# Patient Record
Sex: Female | Born: 1945 | Race: White | Hispanic: No | Marital: Single | Smoking: Former smoker
Health system: Southern US, Community
[De-identification: ages and names within clinical notes are randomized; demographics above are authoritative.]

## PROBLEM LIST (undated history)

## (undated) DIAGNOSIS — I10 Essential (primary) hypertension: Secondary | ICD-10-CM

## (undated) DIAGNOSIS — R51 Headache: Secondary | ICD-10-CM

## (undated) DIAGNOSIS — R110 Nausea: Secondary | ICD-10-CM

## (undated) DIAGNOSIS — R5383 Other fatigue: Principal | ICD-10-CM

## (undated) DIAGNOSIS — J310 Chronic rhinitis: Secondary | ICD-10-CM

## (undated) DIAGNOSIS — C140 Malignant neoplasm of pharynx, unspecified: Secondary | ICD-10-CM

## (undated) DIAGNOSIS — E785 Hyperlipidemia, unspecified: Secondary | ICD-10-CM

## (undated) DIAGNOSIS — E039 Hypothyroidism, unspecified: Principal | ICD-10-CM

## (undated) DIAGNOSIS — T884XXA Failed or difficult intubation, initial encounter: Secondary | ICD-10-CM

## (undated) DIAGNOSIS — C50919 Malignant neoplasm of unspecified site of unspecified female breast: Secondary | ICD-10-CM

## (undated) HISTORY — DX: Malignant neoplasm of pharynx, unspecified: C14.0

## (undated) HISTORY — PX: OTHER SURGICAL HISTORY: SHX169

## (undated) HISTORY — DX: Hyperlipidemia, unspecified: E78.5

## (undated) HISTORY — DX: Hypothyroidism, unspecified: E03.9

## (undated) HISTORY — DX: Chronic rhinitis: J31.0

## (undated) HISTORY — PX: ABDOMINAL SURGERY: SHX537

## (undated) HISTORY — DX: Other fatigue: R53.83

## (undated) HISTORY — DX: Nausea: R11.0

## (undated) HISTORY — DX: Essential (primary) hypertension: I10

## (undated) HISTORY — DX: Headache: R51

## (undated) HISTORY — PX: APPENDECTOMY: SHX54

---

## 1998-11-03 ENCOUNTER — Other Ambulatory Visit: Admission: RE | Admit: 1998-11-03 | Discharge: 1998-11-03 | Payer: Self-pay | Admitting: Obstetrics and Gynecology

## 1999-09-20 ENCOUNTER — Encounter: Payer: Self-pay | Admitting: Obstetrics and Gynecology

## 1999-09-20 ENCOUNTER — Encounter: Admission: RE | Admit: 1999-09-20 | Discharge: 1999-09-20 | Payer: Self-pay | Admitting: Obstetrics and Gynecology

## 1999-11-06 ENCOUNTER — Other Ambulatory Visit: Admission: RE | Admit: 1999-11-06 | Discharge: 1999-11-06 | Payer: Self-pay | Admitting: Obstetrics and Gynecology

## 2000-10-02 ENCOUNTER — Encounter: Payer: Self-pay | Admitting: Obstetrics and Gynecology

## 2000-10-02 ENCOUNTER — Encounter: Admission: RE | Admit: 2000-10-02 | Discharge: 2000-10-02 | Payer: Self-pay | Admitting: Obstetrics and Gynecology

## 2000-11-06 ENCOUNTER — Other Ambulatory Visit: Admission: RE | Admit: 2000-11-06 | Discharge: 2000-11-06 | Payer: Self-pay | Admitting: Obstetrics and Gynecology

## 2001-10-15 ENCOUNTER — Encounter: Payer: Self-pay | Admitting: Obstetrics and Gynecology

## 2001-10-15 ENCOUNTER — Encounter: Admission: RE | Admit: 2001-10-15 | Discharge: 2001-10-15 | Payer: Self-pay | Admitting: Obstetrics and Gynecology

## 2001-11-12 ENCOUNTER — Other Ambulatory Visit: Admission: RE | Admit: 2001-11-12 | Discharge: 2001-11-12 | Payer: Self-pay | Admitting: Obstetrics and Gynecology

## 2002-11-05 ENCOUNTER — Encounter: Admission: RE | Admit: 2002-11-05 | Discharge: 2002-11-05 | Payer: Self-pay | Admitting: Obstetrics and Gynecology

## 2002-11-05 ENCOUNTER — Encounter: Payer: Self-pay | Admitting: Obstetrics and Gynecology

## 2002-11-19 ENCOUNTER — Other Ambulatory Visit: Admission: RE | Admit: 2002-11-19 | Discharge: 2002-11-19 | Payer: Self-pay | Admitting: Obstetrics and Gynecology

## 2003-11-17 ENCOUNTER — Encounter: Admission: RE | Admit: 2003-11-17 | Discharge: 2003-11-17 | Payer: Self-pay | Admitting: Obstetrics and Gynecology

## 2003-11-22 ENCOUNTER — Other Ambulatory Visit: Admission: RE | Admit: 2003-11-22 | Discharge: 2003-11-22 | Payer: Self-pay | Admitting: Obstetrics and Gynecology

## 2004-04-27 ENCOUNTER — Ambulatory Visit: Payer: Self-pay | Admitting: Internal Medicine

## 2004-05-08 ENCOUNTER — Ambulatory Visit: Payer: Self-pay | Admitting: Internal Medicine

## 2004-11-07 ENCOUNTER — Ambulatory Visit: Payer: Self-pay | Admitting: Internal Medicine

## 2004-11-20 ENCOUNTER — Ambulatory Visit: Payer: Self-pay | Admitting: Internal Medicine

## 2004-11-21 ENCOUNTER — Encounter: Admission: RE | Admit: 2004-11-21 | Discharge: 2004-11-21 | Payer: Self-pay | Admitting: Internal Medicine

## 2004-11-22 ENCOUNTER — Encounter: Admission: RE | Admit: 2004-11-22 | Discharge: 2004-11-22 | Payer: Self-pay | Admitting: Obstetrics and Gynecology

## 2004-12-07 ENCOUNTER — Encounter: Admission: RE | Admit: 2004-12-07 | Discharge: 2004-12-07 | Payer: Self-pay | Admitting: Obstetrics and Gynecology

## 2004-12-21 ENCOUNTER — Other Ambulatory Visit: Admission: RE | Admit: 2004-12-21 | Discharge: 2004-12-21 | Payer: Self-pay | Admitting: Obstetrics and Gynecology

## 2005-08-23 ENCOUNTER — Ambulatory Visit: Payer: Self-pay | Admitting: Internal Medicine

## 2005-09-04 ENCOUNTER — Ambulatory Visit: Payer: Self-pay | Admitting: Internal Medicine

## 2005-12-11 ENCOUNTER — Encounter: Admission: RE | Admit: 2005-12-11 | Discharge: 2005-12-11 | Payer: Self-pay | Admitting: Obstetrics and Gynecology

## 2005-12-25 ENCOUNTER — Other Ambulatory Visit: Admission: RE | Admit: 2005-12-25 | Discharge: 2005-12-25 | Payer: Self-pay | Admitting: Obstetrics & Gynecology

## 2006-02-26 ENCOUNTER — Ambulatory Visit: Payer: Self-pay | Admitting: Internal Medicine

## 2006-03-14 ENCOUNTER — Ambulatory Visit: Payer: Self-pay | Admitting: Internal Medicine

## 2006-07-02 DIAGNOSIS — C50919 Malignant neoplasm of unspecified site of unspecified female breast: Secondary | ICD-10-CM

## 2006-07-02 HISTORY — PX: BREAST LUMPECTOMY: SHX2

## 2006-07-02 HISTORY — DX: Malignant neoplasm of unspecified site of unspecified female breast: C50.919

## 2006-09-24 ENCOUNTER — Ambulatory Visit: Payer: Self-pay | Admitting: Internal Medicine

## 2006-09-24 LAB — CONVERTED CEMR LAB
Cholesterol: 304 mg/dL (ref 0–200)
Direct LDL: 172.9 mg/dL
HDL: 93.1 mg/dL (ref >39.0)
TSH: 0.93 microintl units/mL (ref 0.35–5.50)
Total CHOL/HDL Ratio: 3.3

## 2007-02-13 ENCOUNTER — Encounter: Admission: RE | Admit: 2007-02-13 | Discharge: 2007-02-13 | Payer: Self-pay | Admitting: Obstetrics and Gynecology

## 2007-02-14 ENCOUNTER — Encounter: Payer: Self-pay | Admitting: Internal Medicine

## 2007-02-21 DIAGNOSIS — R519 Headache, unspecified: Secondary | ICD-10-CM | POA: Insufficient documentation

## 2007-02-21 DIAGNOSIS — R51 Headache: Secondary | ICD-10-CM

## 2007-02-21 DIAGNOSIS — E785 Hyperlipidemia, unspecified: Secondary | ICD-10-CM

## 2007-02-21 DIAGNOSIS — I10 Essential (primary) hypertension: Secondary | ICD-10-CM | POA: Insufficient documentation

## 2007-02-27 ENCOUNTER — Encounter: Admission: RE | Admit: 2007-02-27 | Discharge: 2007-02-27 | Payer: Self-pay | Admitting: Obstetrics and Gynecology

## 2007-02-27 ENCOUNTER — Encounter (INDEPENDENT_AMBULATORY_CARE_PROVIDER_SITE_OTHER): Payer: Self-pay | Admitting: Diagnostic Radiology

## 2007-02-27 ENCOUNTER — Other Ambulatory Visit: Admission: RE | Admit: 2007-02-27 | Discharge: 2007-02-27 | Payer: Self-pay | Admitting: Obstetrics and Gynecology

## 2007-03-04 ENCOUNTER — Telehealth: Payer: Self-pay | Admitting: Internal Medicine

## 2007-03-06 ENCOUNTER — Ambulatory Visit: Payer: Self-pay | Admitting: Internal Medicine

## 2007-03-06 DIAGNOSIS — F17200 Nicotine dependence, unspecified, uncomplicated: Secondary | ICD-10-CM | POA: Insufficient documentation

## 2007-03-06 DIAGNOSIS — F411 Generalized anxiety disorder: Secondary | ICD-10-CM | POA: Insufficient documentation

## 2007-03-10 ENCOUNTER — Encounter: Admission: RE | Admit: 2007-03-10 | Discharge: 2007-03-10 | Payer: Self-pay | Admitting: Obstetrics and Gynecology

## 2007-03-11 ENCOUNTER — Encounter: Payer: Self-pay | Admitting: Internal Medicine

## 2007-03-11 LAB — CONVERTED CEMR LAB
BUN: 8 mg/dL (ref 6–23)
CO2: 27 meq/L (ref 19–32)
Calcium: 10.6 mg/dL — ABNORMAL HIGH (ref 8.4–10.5)
Chloride: 94 meq/L — ABNORMAL LOW (ref 96–112)
Creatinine, Ser: 0.3 mg/dL — ABNORMAL LOW (ref 0.4–1.2)
GFR calc Af Amer: 291 mL/min
GFR calc non Af Amer: 240 mL/min
Glucose, Bld: 97 mg/dL (ref 70–99)
Potassium: 4.9 meq/L (ref 3.5–5.1)
Sodium: 130 meq/L — ABNORMAL LOW (ref 135–145)

## 2007-03-19 ENCOUNTER — Telehealth (INDEPENDENT_AMBULATORY_CARE_PROVIDER_SITE_OTHER): Payer: Self-pay | Admitting: *Deleted

## 2007-03-28 ENCOUNTER — Ambulatory Visit (HOSPITAL_COMMUNITY): Admission: RE | Admit: 2007-03-28 | Discharge: 2007-03-29 | Payer: Self-pay | Admitting: Surgery

## 2007-03-28 ENCOUNTER — Encounter (INDEPENDENT_AMBULATORY_CARE_PROVIDER_SITE_OTHER): Payer: Self-pay | Admitting: Surgery

## 2007-04-01 ENCOUNTER — Ambulatory Visit: Payer: Self-pay | Admitting: Oncology

## 2007-04-03 ENCOUNTER — Encounter: Payer: Self-pay | Admitting: Internal Medicine

## 2007-04-07 ENCOUNTER — Ambulatory Visit: Admission: RE | Admit: 2007-04-07 | Discharge: 2007-05-22 | Payer: Self-pay | Admitting: Radiation Oncology

## 2007-04-08 ENCOUNTER — Encounter: Payer: Self-pay | Admitting: Internal Medicine

## 2007-04-08 LAB — CBC WITH DIFFERENTIAL/PLATELET
Basophils Absolute: 0.1 10*3/uL (ref 0.0–0.1)
EOS%: 1.6 % (ref 0.0–7.0)
HGB: 13.4 g/dL (ref 11.6–15.9)
MCH: 32.9 pg (ref 26.0–34.0)
MCV: 92.7 fL (ref 81.0–101.0)
MONO%: 10.8 % (ref 0.0–13.0)
NEUT#: 5.7 10*3/uL (ref 1.5–6.5)
RBC: 4.09 10*6/uL (ref 3.70–5.32)
RDW: 12 % (ref 11.3–14.5)
lymph#: 1.9 10*3/uL (ref 0.9–3.3)

## 2007-04-08 LAB — COMPREHENSIVE METABOLIC PANEL
ALT: 18 U/L (ref 0–35)
CO2: 23 mEq/L (ref 19–32)
Calcium: 10.5 mg/dL (ref 8.4–10.5)
Chloride: 96 mEq/L (ref 96–112)
Creatinine, Ser: 0.47 mg/dL (ref 0.40–1.20)
Total Protein: 7.8 g/dL (ref 6.0–8.3)

## 2007-04-08 LAB — LACTATE DEHYDROGENASE: LDH: 116 U/L (ref 94–250)

## 2007-04-14 ENCOUNTER — Telehealth: Payer: Self-pay | Admitting: Internal Medicine

## 2007-04-14 LAB — VITAMIN D PNL(25-HYDRXY+1,25-DIHY)-BLD: Vit D, 25-Hydroxy: 53 ng/mL (ref 30–89)

## 2007-04-15 ENCOUNTER — Ambulatory Visit (HOSPITAL_COMMUNITY): Admission: RE | Admit: 2007-04-15 | Discharge: 2007-04-15 | Payer: Self-pay | Admitting: Oncology

## 2007-04-17 ENCOUNTER — Ambulatory Visit (HOSPITAL_COMMUNITY): Admission: RE | Admit: 2007-04-17 | Discharge: 2007-04-17 | Payer: Self-pay | Admitting: Oncology

## 2007-04-17 ENCOUNTER — Encounter: Payer: Self-pay | Admitting: Internal Medicine

## 2007-04-24 ENCOUNTER — Encounter: Payer: Self-pay | Admitting: Oncology

## 2007-04-25 ENCOUNTER — Ambulatory Visit: Payer: Self-pay

## 2007-04-30 ENCOUNTER — Ambulatory Visit (HOSPITAL_BASED_OUTPATIENT_CLINIC_OR_DEPARTMENT_OTHER): Admission: RE | Admit: 2007-04-30 | Discharge: 2007-04-30 | Payer: Self-pay | Admitting: Surgery

## 2007-05-14 ENCOUNTER — Ambulatory Visit: Payer: Self-pay | Admitting: Oncology

## 2007-05-14 ENCOUNTER — Encounter: Payer: Self-pay | Admitting: Internal Medicine

## 2007-05-14 LAB — CBC WITH DIFFERENTIAL/PLATELET
BASO%: 0.2 % (ref 0.0–2.0)
EOS%: 0.3 % (ref 0.0–7.0)
HCT: 33.5 % — ABNORMAL LOW (ref 34.8–46.6)
LYMPH%: 16.1 % (ref 14.0–48.0)
MCH: 33 pg (ref 26.0–34.0)
MCHC: 36 g/dL (ref 32.0–36.0)
MONO%: 13.1 % — ABNORMAL HIGH (ref 0.0–13.0)
NEUT%: 70.3 % (ref 39.6–76.8)
Platelets: 420 10*3/uL — ABNORMAL HIGH (ref 145–400)

## 2007-05-27 LAB — CBC WITH DIFFERENTIAL/PLATELET
Basophils Absolute: 0 10*3/uL (ref 0.0–0.1)
Eosinophils Absolute: 0 10*3/uL (ref 0.0–0.5)
HGB: 12.2 g/dL (ref 11.6–15.9)
NEUT#: 10.8 10*3/uL — ABNORMAL HIGH (ref 1.5–6.5)
RDW: 12.5 % (ref 11.3–14.5)
lymph#: 0.4 10*3/uL — ABNORMAL LOW (ref 0.9–3.3)

## 2007-06-04 ENCOUNTER — Encounter: Payer: Self-pay | Admitting: Internal Medicine

## 2007-06-04 LAB — CBC WITH DIFFERENTIAL/PLATELET
Basophils Absolute: 0.1 10*3/uL (ref 0.0–0.1)
Eosinophils Absolute: 0.1 10*3/uL (ref 0.0–0.5)
HGB: 11.6 g/dL (ref 11.6–15.9)
LYMPH%: 12.1 % — ABNORMAL LOW (ref 14.0–48.0)
MCV: 92.7 fL (ref 81.0–101.0)
MONO%: 14.2 % — ABNORMAL HIGH (ref 0.0–13.0)
NEUT#: 15.9 10*3/uL — ABNORMAL HIGH (ref 1.5–6.5)
Platelets: 428 10*3/uL — ABNORMAL HIGH (ref 145–400)
RDW: 12.5 % (ref 11.3–14.5)

## 2007-06-04 LAB — COMPREHENSIVE METABOLIC PANEL
Albumin: 4.3 g/dL (ref 3.5–5.2)
Alkaline Phosphatase: 112 U/L (ref 39–117)
BUN: 14 mg/dL (ref 6–23)
CO2: 25 mEq/L (ref 19–32)
Calcium: 9.8 mg/dL (ref 8.4–10.5)
Glucose, Bld: 99 mg/dL (ref 70–99)
Potassium: 4.3 mEq/L (ref 3.5–5.3)

## 2007-06-17 ENCOUNTER — Encounter: Payer: Self-pay | Admitting: Internal Medicine

## 2007-06-17 LAB — CBC WITH DIFFERENTIAL/PLATELET
Basophils Absolute: 0 10*3/uL (ref 0.0–0.1)
Eosinophils Absolute: 0 10*3/uL (ref 0.0–0.5)
HGB: 12.6 g/dL (ref 11.6–15.9)
MCV: 92.7 fL (ref 81.0–101.0)
MONO#: 0 10*3/uL — ABNORMAL LOW (ref 0.1–0.9)
NEUT#: 7.9 10*3/uL — ABNORMAL HIGH (ref 1.5–6.5)
RDW: 13.6 % (ref 11.3–14.5)
WBC: 8.3 10*3/uL (ref 3.9–10.0)
lymph#: 0.4 10*3/uL — ABNORMAL LOW (ref 0.9–3.3)

## 2007-06-17 LAB — COMPREHENSIVE METABOLIC PANEL
ALT: 31 U/L (ref 0–35)
AST: 24 U/L (ref 0–37)
Albumin: 4.8 g/dL (ref 3.5–5.2)
Alkaline Phosphatase: 121 U/L — ABNORMAL HIGH (ref 39–117)
BUN: 17 mg/dL (ref 6–23)
CO2: 24 mEq/L (ref 19–32)
Calcium: 10.4 mg/dL (ref 8.4–10.5)
Chloride: 96 mEq/L (ref 96–112)
Creatinine, Ser: 0.62 mg/dL (ref 0.40–1.20)
Glucose, Bld: 156 mg/dL — ABNORMAL HIGH (ref 70–99)
Potassium: 4.4 mEq/L (ref 3.5–5.3)
Sodium: 134 mEq/L — ABNORMAL LOW (ref 135–145)
Total Bilirubin: 0.3 mg/dL (ref 0.3–1.2)
Total Protein: 7.7 g/dL (ref 6.0–8.3)

## 2007-06-23 ENCOUNTER — Ambulatory Visit: Payer: Self-pay | Admitting: Oncology

## 2007-06-23 ENCOUNTER — Encounter: Payer: Self-pay | Admitting: Internal Medicine

## 2007-06-25 ENCOUNTER — Encounter: Payer: Self-pay | Admitting: Internal Medicine

## 2007-06-25 LAB — COMPREHENSIVE METABOLIC PANEL
AST: 21 U/L (ref 0–37)
Albumin: 4.4 g/dL (ref 3.5–5.2)
Alkaline Phosphatase: 134 U/L — ABNORMAL HIGH (ref 39–117)
BUN: 8 mg/dL (ref 6–23)
Calcium: 10 mg/dL (ref 8.4–10.5)
Chloride: 99 mEq/L (ref 96–112)
Creatinine, Ser: 0.5 mg/dL (ref 0.40–1.20)
Glucose, Bld: 112 mg/dL — ABNORMAL HIGH (ref 70–99)

## 2007-06-25 LAB — CBC WITH DIFFERENTIAL/PLATELET
Basophils Absolute: 0 10*3/uL (ref 0.0–0.1)
EOS%: 1 % (ref 0.0–7.0)
Eosinophils Absolute: 0.1 10*3/uL (ref 0.0–0.5)
HGB: 12.2 g/dL (ref 11.6–15.9)
NEUT#: 8.9 10*3/uL — ABNORMAL HIGH (ref 1.5–6.5)
RBC: 3.69 10*6/uL — ABNORMAL LOW (ref 3.70–5.32)
RDW: 13.6 % (ref 11.3–14.5)
lymph#: 2 10*3/uL (ref 0.9–3.3)

## 2007-07-24 LAB — CBC WITH DIFFERENTIAL/PLATELET
Basophils Absolute: 0 10*3/uL (ref 0.0–0.1)
EOS%: 2.2 % (ref 0.0–7.0)
Eosinophils Absolute: 0.2 10*3/uL (ref 0.0–0.5)
HCT: 34.6 % — ABNORMAL LOW (ref 34.8–46.6)
HGB: 12 g/dL (ref 11.6–15.9)
MCH: 32.1 pg (ref 26.0–34.0)
NEUT#: 7 10*3/uL — ABNORMAL HIGH (ref 1.5–6.5)
NEUT%: 77.2 % — ABNORMAL HIGH (ref 39.6–76.8)
RDW: 13.3 % (ref 11.3–14.5)
lymph#: 0.9 10*3/uL (ref 0.9–3.3)

## 2007-07-29 ENCOUNTER — Encounter: Payer: Self-pay | Admitting: Internal Medicine

## 2007-07-29 ENCOUNTER — Ambulatory Visit (HOSPITAL_COMMUNITY): Admission: RE | Admit: 2007-07-29 | Discharge: 2007-07-29 | Payer: Self-pay | Admitting: Oncology

## 2007-07-29 LAB — CBC WITH DIFFERENTIAL/PLATELET
Basophils Absolute: 0 10*3/uL (ref 0.0–0.1)
EOS%: 4.3 % (ref 0.0–7.0)
Eosinophils Absolute: 0.3 10*3/uL (ref 0.0–0.5)
HCT: 35.2 % (ref 34.8–46.6)
HGB: 12.6 g/dL (ref 11.6–15.9)
LYMPH%: 19.7 % (ref 14.0–48.0)
MCH: 33.1 pg (ref 26.0–34.0)
MCV: 92.6 fL (ref 81.0–101.0)
MONO%: 5.9 % (ref 0.0–13.0)
NEUT#: 5.7 10*3/uL (ref 1.5–6.5)
NEUT%: 69.9 % (ref 39.6–76.8)
Platelets: 715 10*3/uL — ABNORMAL HIGH (ref 145–400)

## 2007-07-29 LAB — COMPREHENSIVE METABOLIC PANEL
AST: 17 U/L (ref 0–37)
Albumin: 4.2 g/dL (ref 3.5–5.2)
Alkaline Phosphatase: 102 U/L (ref 39–117)
BUN: 10 mg/dL (ref 6–23)
Creatinine, Ser: 0.56 mg/dL (ref 0.40–1.20)
Glucose, Bld: 117 mg/dL — ABNORMAL HIGH (ref 70–99)
Potassium: 4.2 mEq/L (ref 3.5–5.3)
Total Bilirubin: 0.3 mg/dL (ref 0.3–1.2)

## 2007-07-29 LAB — LACTATE DEHYDROGENASE: LDH: 104 U/L (ref 94–250)

## 2007-08-18 ENCOUNTER — Ambulatory Visit: Payer: Self-pay | Admitting: Oncology

## 2007-08-18 ENCOUNTER — Ambulatory Visit: Admission: RE | Admit: 2007-08-18 | Discharge: 2007-11-16 | Payer: Self-pay | Admitting: Oncology

## 2007-08-26 ENCOUNTER — Encounter: Payer: Self-pay | Admitting: Internal Medicine

## 2007-09-18 ENCOUNTER — Encounter: Payer: Self-pay | Admitting: Internal Medicine

## 2007-11-03 ENCOUNTER — Encounter: Payer: Self-pay | Admitting: Internal Medicine

## 2007-11-05 ENCOUNTER — Telehealth: Payer: Self-pay | Admitting: Internal Medicine

## 2007-11-13 ENCOUNTER — Ambulatory Visit: Payer: Self-pay | Admitting: Internal Medicine

## 2007-11-13 LAB — CONVERTED CEMR LAB
Bilirubin Urine: NEGATIVE
Glucose, Urine, Semiquant: NEGATIVE
Nitrite: NEGATIVE
Protein, U semiquant: NEGATIVE
Retic Ct Pct: 1.3 % (ref 0.4–3.1)
Urobilinogen, UA: 0.2
WBC Urine, dipstick: NEGATIVE

## 2007-11-18 LAB — CONVERTED CEMR LAB
ALT: 17 units/L (ref 0–35)
Albumin: 4.2 g/dL (ref 3.5–5.2)
BUN: 12 mg/dL (ref 6–23)
Basophils Relative: 0.3 % (ref 0.0–1.0)
Bilirubin, Direct: 0.1 mg/dL (ref 0.0–0.3)
CO2: 29 meq/L (ref 19–32)
Calcium: 10.3 mg/dL (ref 8.4–10.5)
Cholesterol: 287 mg/dL (ref 0–200)
Eosinophils Relative: 3.6 % (ref 0.0–5.0)
GFR calc Af Amer: 161 mL/min
Glucose, Bld: 101 mg/dL — ABNORMAL HIGH (ref 70–99)
HCT: 35.5 % — ABNORMAL LOW (ref 36.0–46.0)
Hemoglobin: 12.2 g/dL (ref 12.0–15.0)
Lymphocytes Relative: 17.3 % (ref 12.0–46.0)
Monocytes Absolute: 0.7 10*3/uL (ref 0.1–1.0)
Monocytes Relative: 12.7 % — ABNORMAL HIGH (ref 3.0–12.0)
Neutro Abs: 3.5 10*3/uL (ref 1.4–7.7)
RBC: 3.73 M/uL — ABNORMAL LOW (ref 3.87–5.11)
Sodium: 135 meq/L (ref 135–145)
TSH: 0.88 microintl units/mL (ref 0.35–5.50)
Total CHOL/HDL Ratio: 2.9
Total Protein: 7.5 g/dL (ref 6.0–8.3)
VLDL: 20 mg/dL (ref 0–40)
WBC: 5.3 10*3/uL (ref 4.5–10.5)

## 2007-11-20 ENCOUNTER — Ambulatory Visit: Payer: Self-pay | Admitting: Internal Medicine

## 2007-11-20 DIAGNOSIS — Z853 Personal history of malignant neoplasm of breast: Secondary | ICD-10-CM

## 2008-01-28 ENCOUNTER — Encounter: Payer: Self-pay | Admitting: Internal Medicine

## 2008-02-13 ENCOUNTER — Telehealth: Payer: Self-pay | Admitting: *Deleted

## 2008-03-03 ENCOUNTER — Ambulatory Visit: Payer: Self-pay | Admitting: Oncology

## 2008-03-04 ENCOUNTER — Other Ambulatory Visit: Admission: RE | Admit: 2008-03-04 | Discharge: 2008-03-04 | Payer: Self-pay | Admitting: Obstetrics and Gynecology

## 2008-03-09 ENCOUNTER — Encounter: Payer: Self-pay | Admitting: Internal Medicine

## 2008-03-09 LAB — CBC WITH DIFFERENTIAL/PLATELET
Basophils Absolute: 0 10*3/uL (ref 0.0–0.1)
Eosinophils Absolute: 0.3 10*3/uL (ref 0.0–0.5)
HCT: 36.2 % (ref 34.8–46.6)
HGB: 12.8 g/dL (ref 11.6–15.9)
LYMPH%: 18.9 % (ref 14.0–48.0)
MCV: 93.1 fL (ref 81.0–101.0)
MONO#: 0.6 10*3/uL (ref 0.1–0.9)
MONO%: 9.9 % (ref 0.0–13.0)
NEUT#: 4 10*3/uL (ref 1.5–6.5)
NEUT%: 66.3 % (ref 39.6–76.8)
Platelets: 440 10*3/uL — ABNORMAL HIGH (ref 145–400)

## 2008-03-10 LAB — COMPREHENSIVE METABOLIC PANEL
Albumin: 4.6 g/dL (ref 3.5–5.2)
Alkaline Phosphatase: 123 U/L — ABNORMAL HIGH (ref 39–117)
BUN: 15 mg/dL (ref 6–23)
CO2: 22 mEq/L (ref 19–32)
Glucose, Bld: 104 mg/dL — ABNORMAL HIGH (ref 70–99)
Total Bilirubin: 0.3 mg/dL (ref 0.3–1.2)

## 2008-03-10 LAB — CANCER ANTIGEN 27.29: CA 27.29: 20 U/mL (ref 0–39)

## 2008-03-10 LAB — LACTATE DEHYDROGENASE: LDH: 105 U/L (ref 94–250)

## 2008-03-10 LAB — VITAMIN D 25 HYDROXY (VIT D DEFICIENCY, FRACTURES): Vit D, 25-Hydroxy: 34 ng/mL (ref 30–89)

## 2008-03-15 ENCOUNTER — Telehealth: Payer: Self-pay | Admitting: Internal Medicine

## 2008-04-19 ENCOUNTER — Ambulatory Visit (HOSPITAL_BASED_OUTPATIENT_CLINIC_OR_DEPARTMENT_OTHER): Admission: RE | Admit: 2008-04-19 | Discharge: 2008-04-19 | Payer: Self-pay | Admitting: Surgery

## 2008-05-14 ENCOUNTER — Ambulatory Visit: Payer: Self-pay | Admitting: Oncology

## 2008-05-17 ENCOUNTER — Telehealth: Payer: Self-pay | Admitting: Internal Medicine

## 2008-05-25 ENCOUNTER — Encounter: Payer: Self-pay | Admitting: Internal Medicine

## 2008-06-10 ENCOUNTER — Encounter: Admission: RE | Admit: 2008-06-10 | Discharge: 2008-06-10 | Payer: Self-pay | Admitting: Oncology

## 2008-07-02 DIAGNOSIS — C140 Malignant neoplasm of pharynx, unspecified: Secondary | ICD-10-CM

## 2008-07-02 HISTORY — DX: Malignant neoplasm of pharynx, unspecified: C14.0

## 2008-07-19 ENCOUNTER — Telehealth: Payer: Self-pay | Admitting: Internal Medicine

## 2008-08-30 ENCOUNTER — Telehealth: Payer: Self-pay | Admitting: Internal Medicine

## 2008-10-11 ENCOUNTER — Telehealth: Payer: Self-pay | Admitting: *Deleted

## 2008-10-13 ENCOUNTER — Telehealth: Payer: Self-pay | Admitting: Internal Medicine

## 2008-12-06 ENCOUNTER — Encounter: Payer: Self-pay | Admitting: Internal Medicine

## 2008-12-07 ENCOUNTER — Telehealth: Payer: Self-pay | Admitting: *Deleted

## 2008-12-28 ENCOUNTER — Ambulatory Visit: Payer: Self-pay | Admitting: Oncology

## 2008-12-30 LAB — CBC WITH DIFFERENTIAL/PLATELET
Basophils Absolute: 0.1 10*3/uL (ref 0.0–0.1)
Eosinophils Absolute: 0.2 10*3/uL (ref 0.0–0.5)
HGB: 12.5 g/dL (ref 11.6–15.9)
MONO%: 12.8 % (ref 0.0–14.0)
NEUT#: 3.5 10*3/uL (ref 1.5–6.5)
RBC: 3.98 10*6/uL (ref 3.70–5.45)
RDW: 12.8 % (ref 11.2–14.5)
WBC: 5.9 10*3/uL (ref 3.9–10.3)
lymph#: 1.3 10*3/uL (ref 0.9–3.3)

## 2008-12-31 LAB — COMPREHENSIVE METABOLIC PANEL
AST: 28 U/L (ref 0–37)
Albumin: 4.3 g/dL (ref 3.5–5.2)
Alkaline Phosphatase: 113 U/L (ref 39–117)
BUN: 16 mg/dL (ref 6–23)
Calcium: 10.1 mg/dL (ref 8.4–10.5)
Chloride: 100 mEq/L (ref 96–112)
Glucose, Bld: 106 mg/dL — ABNORMAL HIGH (ref 70–99)
Potassium: 4.3 mEq/L (ref 3.5–5.3)
Sodium: 133 mEq/L — ABNORMAL LOW (ref 135–145)
Total Protein: 7.1 g/dL (ref 6.0–8.3)

## 2008-12-31 LAB — CANCER ANTIGEN 27.29: CA 27.29: 17 U/mL (ref 0–39)

## 2008-12-31 LAB — VITAMIN D 25 HYDROXY (VIT D DEFICIENCY, FRACTURES): Vit D, 25-Hydroxy: 37 ng/mL (ref 30–89)

## 2009-02-14 ENCOUNTER — Encounter: Payer: Self-pay | Admitting: Internal Medicine

## 2009-02-21 ENCOUNTER — Ambulatory Visit (HOSPITAL_COMMUNITY): Admission: RE | Admit: 2009-02-21 | Discharge: 2009-02-21 | Payer: Self-pay | Admitting: Oncology

## 2009-03-09 ENCOUNTER — Ambulatory Visit (HOSPITAL_COMMUNITY): Admission: RE | Admit: 2009-03-09 | Discharge: 2009-03-09 | Payer: Self-pay | Admitting: Oncology

## 2009-03-09 ENCOUNTER — Encounter (INDEPENDENT_AMBULATORY_CARE_PROVIDER_SITE_OTHER): Payer: Self-pay | Admitting: Diagnostic Radiology

## 2009-03-10 ENCOUNTER — Ambulatory Visit: Payer: Self-pay | Admitting: Oncology

## 2009-03-14 ENCOUNTER — Encounter: Payer: Self-pay | Admitting: Internal Medicine

## 2009-03-15 ENCOUNTER — Ambulatory Visit: Admission: RE | Admit: 2009-03-15 | Discharge: 2009-03-31 | Payer: Self-pay | Admitting: Radiation Oncology

## 2009-03-16 ENCOUNTER — Ambulatory Visit (HOSPITAL_COMMUNITY): Admission: RE | Admit: 2009-03-16 | Discharge: 2009-03-16 | Payer: Self-pay | Admitting: Oncology

## 2009-03-16 ENCOUNTER — Encounter: Payer: Self-pay | Admitting: Internal Medicine

## 2009-03-21 ENCOUNTER — Ambulatory Visit: Payer: Self-pay | Admitting: Dentistry

## 2009-03-21 ENCOUNTER — Encounter: Admission: AD | Admit: 2009-03-21 | Discharge: 2009-03-21 | Payer: Self-pay | Admitting: Dentistry

## 2009-03-25 ENCOUNTER — Encounter: Payer: Self-pay | Admitting: Internal Medicine

## 2009-03-25 ENCOUNTER — Encounter (INDEPENDENT_AMBULATORY_CARE_PROVIDER_SITE_OTHER): Payer: Self-pay | Admitting: Otolaryngology

## 2009-03-25 ENCOUNTER — Ambulatory Visit (HOSPITAL_COMMUNITY): Admission: RE | Admit: 2009-03-25 | Discharge: 2009-03-25 | Payer: Self-pay | Admitting: Otolaryngology

## 2009-03-29 ENCOUNTER — Ambulatory Visit (HOSPITAL_COMMUNITY): Admission: RE | Admit: 2009-03-29 | Discharge: 2009-03-29 | Payer: Self-pay | Admitting: Radiation Oncology

## 2009-04-05 LAB — CBC WITH DIFFERENTIAL/PLATELET
EOS%: 1.4 % (ref 0.0–7.0)
Eosinophils Absolute: 0.1 10*3/uL (ref 0.0–0.5)
LYMPH%: 15.2 % (ref 14.0–49.7)
MCH: 32.6 pg (ref 25.1–34.0)
MCHC: 35.1 g/dL (ref 31.5–36.0)
MCV: 93.1 fL (ref 79.5–101.0)
MONO%: 17.8 % — ABNORMAL HIGH (ref 0.0–14.0)
NEUT#: 3.9 10*3/uL (ref 1.5–6.5)
Platelets: 527 10*3/uL — ABNORMAL HIGH (ref 145–400)
RBC: 3.73 10*6/uL (ref 3.70–5.45)

## 2009-04-05 LAB — COMPREHENSIVE METABOLIC PANEL
ALT: 26 U/L (ref 0–35)
Alkaline Phosphatase: 135 U/L — ABNORMAL HIGH (ref 39–117)
CO2: 24 mEq/L (ref 19–32)
Potassium: 4.6 mEq/L (ref 3.5–5.3)
Sodium: 132 mEq/L — ABNORMAL LOW (ref 135–145)
Total Bilirubin: 0.2 mg/dL — ABNORMAL LOW (ref 0.3–1.2)
Total Protein: 6.9 g/dL (ref 6.0–8.3)

## 2009-04-06 ENCOUNTER — Ambulatory Visit: Admission: RE | Admit: 2009-04-06 | Discharge: 2009-06-16 | Payer: Self-pay | Admitting: Radiation Oncology

## 2009-04-08 ENCOUNTER — Encounter: Payer: Self-pay | Admitting: Internal Medicine

## 2009-04-14 ENCOUNTER — Ambulatory Visit: Payer: Self-pay | Admitting: Oncology

## 2009-04-18 LAB — COMPREHENSIVE METABOLIC PANEL
ALT: 29 U/L (ref 0–35)
AST: 27 U/L (ref 0–37)
Alkaline Phosphatase: 129 U/L — ABNORMAL HIGH (ref 39–117)
Creatinine, Ser: 0.41 mg/dL (ref 0.40–1.20)
Total Bilirubin: 0.3 mg/dL (ref 0.3–1.2)

## 2009-04-18 LAB — CBC WITH DIFFERENTIAL/PLATELET
BASO%: 1 % (ref 0.0–2.0)
EOS%: 3.9 % (ref 0.0–7.0)
LYMPH%: 25.2 % (ref 14.0–49.7)
MCHC: 34.1 g/dL (ref 31.5–36.0)
MCV: 92.3 fL (ref 79.5–101.0)
MONO%: 13.4 % (ref 0.0–14.0)
Platelets: 425 10*3/uL — ABNORMAL HIGH (ref 145–400)
RBC: 4.16 10*6/uL (ref 3.70–5.45)
nRBC: 0 % (ref 0–0)

## 2009-04-18 LAB — MAGNESIUM: Magnesium: 2.4 mg/dL (ref 1.5–2.5)

## 2009-04-25 ENCOUNTER — Encounter (INDEPENDENT_AMBULATORY_CARE_PROVIDER_SITE_OTHER): Payer: Self-pay | Admitting: *Deleted

## 2009-04-25 LAB — COMPREHENSIVE METABOLIC PANEL
ALT: 37 U/L — ABNORMAL HIGH (ref 0–35)
AST: 38 U/L — ABNORMAL HIGH (ref 0–37)
Albumin: 4.1 g/dL (ref 3.5–5.2)
Alkaline Phosphatase: 143 U/L — ABNORMAL HIGH (ref 39–117)
BUN: 12 mg/dL (ref 6–23)
CO2: 26 mEq/L (ref 19–32)
Calcium: 10 mg/dL (ref 8.4–10.5)
Chloride: 99 mEq/L (ref 96–112)
Creatinine, Ser: 0.4 mg/dL (ref 0.40–1.20)
Glucose, Bld: 86 mg/dL (ref 70–99)
Potassium: 4.5 mEq/L (ref 3.5–5.3)
Sodium: 133 mEq/L — ABNORMAL LOW (ref 135–145)
Total Bilirubin: 0.9 mg/dL (ref 0.3–1.2)
Total Protein: 7.9 g/dL (ref 6.0–8.3)

## 2009-04-25 LAB — CBC WITH DIFFERENTIAL/PLATELET
Basophils Absolute: 0 10*3/uL (ref 0.0–0.1)
EOS%: 2.2 % (ref 0.0–7.0)
HCT: 37.5 % (ref 34.8–46.6)
HGB: 12.7 g/dL (ref 11.6–15.9)
MCH: 31.5 pg (ref 25.1–34.0)
NEUT%: 65.5 % (ref 38.4–76.8)
lymph#: 1.1 10*3/uL (ref 0.9–3.3)

## 2009-05-02 LAB — CBC WITH DIFFERENTIAL/PLATELET
Basophils Absolute: 0 10*3/uL (ref 0.0–0.1)
Eosinophils Absolute: 0.2 10*3/uL (ref 0.0–0.5)
HCT: 34.6 % — ABNORMAL LOW (ref 34.8–46.6)
LYMPH%: 13.1 % — ABNORMAL LOW (ref 14.0–49.7)
MCV: 93.6 fL (ref 79.5–101.0)
MONO%: 13.6 % (ref 0.0–14.0)
NEUT#: 5.2 10*3/uL (ref 1.5–6.5)
NEUT%: 71 % (ref 38.4–76.8)
Platelets: 333 10*3/uL (ref 145–400)
RBC: 3.7 10*6/uL (ref 3.70–5.45)

## 2009-05-02 LAB — COMPREHENSIVE METABOLIC PANEL
Alkaline Phosphatase: 127 U/L — ABNORMAL HIGH (ref 39–117)
Creatinine, Ser: 0.38 mg/dL — ABNORMAL LOW (ref 0.40–1.20)
Glucose, Bld: 108 mg/dL — ABNORMAL HIGH (ref 70–99)
Sodium: 131 mEq/L — ABNORMAL LOW (ref 135–145)
Total Bilirubin: 0.5 mg/dL (ref 0.3–1.2)
Total Protein: 7.5 g/dL (ref 6.0–8.3)

## 2009-05-09 LAB — CBC WITH DIFFERENTIAL/PLATELET
BASO%: 0.6 % (ref 0.0–2.0)
HCT: 33.3 % — ABNORMAL LOW (ref 34.8–46.6)
LYMPH%: 15.2 % (ref 14.0–49.7)
MCH: 31.5 pg (ref 25.1–34.0)
MCHC: 34.5 g/dL (ref 31.5–36.0)
MCV: 91.2 fL (ref 79.5–101.0)
MONO%: 13.6 % (ref 0.0–14.0)
NEUT%: 68.1 % (ref 38.4–76.8)
Platelets: 292 10*3/uL (ref 145–400)
RBC: 3.65 10*6/uL — ABNORMAL LOW (ref 3.70–5.45)
WBC: 5.3 10*3/uL (ref 3.9–10.3)

## 2009-05-09 LAB — OSMOLALITY, URINE: Osmolality, Ur: 80 mOsm/kg — ABNORMAL LOW (ref 390–1090)

## 2009-05-09 LAB — COMPREHENSIVE METABOLIC PANEL
ALT: 26 U/L (ref 0–35)
Alkaline Phosphatase: 123 U/L — ABNORMAL HIGH (ref 39–117)
Creatinine, Ser: 0.45 mg/dL (ref 0.40–1.20)
Sodium: 126 mEq/L — ABNORMAL LOW (ref 135–145)
Total Bilirubin: 0.7 mg/dL (ref 0.3–1.2)
Total Protein: 7.7 g/dL (ref 6.0–8.3)

## 2009-05-09 LAB — OSMOLALITY: Osmolality: 267 mOsm/kg — ABNORMAL LOW (ref 275–300)

## 2009-05-09 LAB — MAGNESIUM: Magnesium: 1.8 mg/dL (ref 1.5–2.5)

## 2009-05-09 LAB — SODIUM, URINE, RANDOM: Sodium, Ur: 16 mEq/L

## 2009-05-11 ENCOUNTER — Ambulatory Visit: Payer: Self-pay | Admitting: Dentistry

## 2009-05-16 ENCOUNTER — Ambulatory Visit: Payer: Self-pay | Admitting: Oncology

## 2009-05-16 LAB — CBC WITH DIFFERENTIAL/PLATELET
Basophils Absolute: 0 10*3/uL (ref 0.0–0.1)
EOS%: 0.7 % (ref 0.0–7.0)
Eosinophils Absolute: 0 10*3/uL (ref 0.0–0.5)
HCT: 30.8 % — ABNORMAL LOW (ref 34.8–46.6)
HGB: 10.7 g/dL — ABNORMAL LOW (ref 11.6–15.9)
MCH: 31.5 pg (ref 25.1–34.0)
MCV: 90.6 fL (ref 79.5–101.0)
MONO%: 13.3 % (ref 0.0–14.0)
NEUT%: 73.6 % (ref 38.4–76.8)

## 2009-05-16 LAB — COMPREHENSIVE METABOLIC PANEL
AST: 32 U/L (ref 0–37)
Alkaline Phosphatase: 133 U/L — ABNORMAL HIGH (ref 39–117)
BUN: 9 mg/dL (ref 6–23)
Creatinine, Ser: 0.46 mg/dL (ref 0.40–1.20)
Total Bilirubin: 0.6 mg/dL (ref 0.3–1.2)

## 2009-05-23 LAB — COMPREHENSIVE METABOLIC PANEL
ALT: 21 U/L (ref 0–35)
AST: 27 U/L (ref 0–37)
BUN: 6 mg/dL (ref 6–23)
Calcium: 9.1 mg/dL (ref 8.4–10.5)
Chloride: 94 mEq/L — ABNORMAL LOW (ref 96–112)
Creatinine, Ser: 0.41 mg/dL (ref 0.40–1.20)
Total Bilirubin: 0.5 mg/dL (ref 0.3–1.2)

## 2009-05-23 LAB — CBC WITH DIFFERENTIAL/PLATELET
Basophils Absolute: 0 10*3/uL (ref 0.0–0.1)
HCT: 28.3 % — ABNORMAL LOW (ref 34.8–46.6)
HGB: 9.8 g/dL — ABNORMAL LOW (ref 11.6–15.9)
LYMPH%: 22.6 % (ref 14.0–49.7)
MCHC: 34.6 g/dL (ref 31.5–36.0)
MONO#: 0.2 10*3/uL (ref 0.1–0.9)
NEUT%: 66.4 % (ref 38.4–76.8)
Platelets: 138 10*3/uL — ABNORMAL LOW (ref 145–400)
WBC: 2.4 10*3/uL — ABNORMAL LOW (ref 3.9–10.3)
lymph#: 0.6 10*3/uL — ABNORMAL LOW (ref 0.9–3.3)

## 2009-05-23 LAB — MAGNESIUM: Magnesium: 1.5 mg/dL (ref 1.5–2.5)

## 2009-05-30 LAB — CBC WITH DIFFERENTIAL/PLATELET
BASO%: 2.4 % — ABNORMAL HIGH (ref 0.0–2.0)
Basophils Absolute: 0 10*3/uL (ref 0.0–0.1)
EOS%: 1.6 % (ref 0.0–7.0)
HCT: 24.9 % — ABNORMAL LOW (ref 34.8–46.6)
HGB: 8.5 g/dL — ABNORMAL LOW (ref 11.6–15.9)
LYMPH%: 23.8 % (ref 14.0–49.7)
MCH: 31.3 pg (ref 25.1–34.0)
MCHC: 34.1 g/dL (ref 31.5–36.0)
MCV: 91.5 fL (ref 79.5–101.0)
MONO%: 20.6 % — ABNORMAL HIGH (ref 0.0–14.0)
NEUT%: 51.6 % (ref 38.4–76.8)
Platelets: 160 10*3/uL (ref 145–400)

## 2009-06-02 LAB — CBC WITH DIFFERENTIAL/PLATELET
Basophils Absolute: 0 10*3/uL (ref 0.0–0.1)
EOS%: 0.9 % (ref 0.0–7.0)
HGB: 7.4 g/dL — ABNORMAL LOW (ref 11.6–15.9)
MCH: 31.5 pg (ref 25.1–34.0)
MCV: 91.9 fL (ref 79.5–101.0)
MONO%: 30.9 % — ABNORMAL HIGH (ref 0.0–14.0)
RDW: 14.4 % (ref 11.2–14.5)

## 2009-06-02 LAB — BASIC METABOLIC PANEL
BUN: 10 mg/dL (ref 6–23)
Calcium: 8.7 mg/dL (ref 8.4–10.5)
Creatinine, Ser: 0.59 mg/dL (ref 0.40–1.20)
Potassium: 4.2 mEq/L (ref 3.5–5.3)

## 2009-06-03 ENCOUNTER — Encounter (HOSPITAL_COMMUNITY): Admission: RE | Admit: 2009-06-03 | Discharge: 2009-07-01 | Payer: Self-pay | Admitting: Oncology

## 2009-06-03 ENCOUNTER — Encounter: Payer: Self-pay | Admitting: Internal Medicine

## 2009-06-03 LAB — TYPE & CROSSMATCH - CHCC

## 2009-06-05 LAB — WOUND CULTURE

## 2009-06-06 LAB — CBC WITH DIFFERENTIAL/PLATELET
Basophils Absolute: 0 10*3/uL (ref 0.0–0.1)
Eosinophils Absolute: 0 10*3/uL (ref 0.0–0.5)
HCT: 32.7 % — ABNORMAL LOW (ref 34.8–46.6)
HGB: 11.3 g/dL — ABNORMAL LOW (ref 11.6–15.9)
MONO#: 0.4 10*3/uL (ref 0.1–0.9)
NEUT#: 1.7 10*3/uL (ref 1.5–6.5)
NEUT%: 67.6 % (ref 38.4–76.8)
RDW: 14.7 % — ABNORMAL HIGH (ref 11.2–14.5)
WBC: 2.5 10*3/uL — ABNORMAL LOW (ref 3.9–10.3)
lymph#: 0.4 10*3/uL — ABNORMAL LOW (ref 0.9–3.3)

## 2009-06-06 LAB — COMPREHENSIVE METABOLIC PANEL
Albumin: 3.2 g/dL — ABNORMAL LOW (ref 3.5–5.2)
BUN: 7 mg/dL (ref 6–23)
CO2: 26 mEq/L (ref 19–32)
Calcium: 9.3 mg/dL (ref 8.4–10.5)
Chloride: 91 mEq/L — ABNORMAL LOW (ref 96–112)
Creatinine, Ser: 0.46 mg/dL (ref 0.40–1.20)
Potassium: 4.1 mEq/L (ref 3.5–5.3)

## 2009-06-06 LAB — MAGNESIUM: Magnesium: 1.4 mg/dL — ABNORMAL LOW (ref 1.5–2.5)

## 2009-06-13 ENCOUNTER — Encounter: Payer: Self-pay | Admitting: Internal Medicine

## 2009-06-13 LAB — COMPREHENSIVE METABOLIC PANEL
ALT: 10 U/L (ref 0–35)
Albumin: 3.8 g/dL (ref 3.5–5.2)
Alkaline Phosphatase: 115 U/L (ref 39–117)
Glucose, Bld: 103 mg/dL — ABNORMAL HIGH (ref 70–99)
Potassium: 4.6 mEq/L (ref 3.5–5.3)
Sodium: 130 mEq/L — ABNORMAL LOW (ref 135–145)
Total Bilirubin: 0.2 mg/dL — ABNORMAL LOW (ref 0.3–1.2)
Total Protein: 6.7 g/dL (ref 6.0–8.3)

## 2009-06-13 LAB — CBC WITH DIFFERENTIAL/PLATELET
Basophils Absolute: 0 10*3/uL (ref 0.0–0.1)
EOS%: 3.4 % (ref 0.0–7.0)
Eosinophils Absolute: 0.1 10*3/uL (ref 0.0–0.5)
HGB: 11.7 g/dL (ref 11.6–15.9)
MCH: 31.3 pg (ref 25.1–34.0)
MCV: 92 fL (ref 79.5–101.0)
MONO%: 17.7 % — ABNORMAL HIGH (ref 0.0–14.0)
NEUT#: 2.3 10*3/uL (ref 1.5–6.5)
RBC: 3.74 10*6/uL (ref 3.70–5.45)
RDW: 15.1 % — ABNORMAL HIGH (ref 11.2–14.5)
lymph#: 0.9 10*3/uL (ref 0.9–3.3)
nRBC: 0 % (ref 0–0)

## 2009-07-11 ENCOUNTER — Ambulatory Visit (HOSPITAL_COMMUNITY): Admission: RE | Admit: 2009-07-11 | Discharge: 2009-07-11 | Payer: Self-pay | Admitting: Oncology

## 2009-07-14 ENCOUNTER — Ambulatory Visit: Payer: Self-pay | Admitting: Oncology

## 2009-07-18 ENCOUNTER — Ambulatory Visit: Payer: Self-pay | Admitting: Dentistry

## 2009-07-18 LAB — COMPREHENSIVE METABOLIC PANEL
ALT: 11 U/L (ref 0–35)
AST: 18 U/L (ref 0–37)
Alkaline Phosphatase: 128 U/L — ABNORMAL HIGH (ref 39–117)
BUN: 11 mg/dL (ref 6–23)
Creatinine, Ser: 0.55 mg/dL (ref 0.40–1.20)
Total Bilirubin: 0.7 mg/dL (ref 0.3–1.2)

## 2009-07-18 LAB — CBC WITH DIFFERENTIAL/PLATELET
BASO%: 1.1 % (ref 0.0–2.0)
Basophils Absolute: 0.1 10*3/uL (ref 0.0–0.1)
EOS%: 5.6 % (ref 0.0–7.0)
HCT: 35.3 % (ref 34.8–46.6)
HGB: 12.1 g/dL (ref 11.6–15.9)
MCH: 33.2 pg (ref 25.1–34.0)
MCHC: 34.3 g/dL (ref 31.5–36.0)
MCV: 96.8 fL (ref 79.5–101.0)
MONO%: 14.1 % — ABNORMAL HIGH (ref 0.0–14.0)
NEUT%: 71.1 % (ref 38.4–76.8)

## 2009-09-12 ENCOUNTER — Ambulatory Visit (HOSPITAL_COMMUNITY): Admission: RE | Admit: 2009-09-12 | Discharge: 2009-09-12 | Payer: Self-pay | Admitting: Oncology

## 2009-09-13 ENCOUNTER — Ambulatory Visit: Payer: Self-pay | Admitting: Oncology

## 2009-09-15 ENCOUNTER — Encounter: Payer: Self-pay | Admitting: Internal Medicine

## 2009-09-15 LAB — CBC WITH DIFFERENTIAL/PLATELET
BASO%: 0.2 % (ref 0.0–2.0)
EOS%: 4.4 % (ref 0.0–7.0)
Eosinophils Absolute: 0.2 10*3/uL (ref 0.0–0.5)
MCHC: 35.2 g/dL (ref 31.5–36.0)
MCV: 97.1 fL (ref 79.5–101.0)
MONO%: 11.7 % (ref 0.0–14.0)
NEUT#: 3.4 10*3/uL (ref 1.5–6.5)
RBC: 3.75 10*6/uL (ref 3.70–5.45)
RDW: 12.6 % (ref 11.2–14.5)

## 2009-09-19 ENCOUNTER — Ambulatory Visit (HOSPITAL_COMMUNITY): Admission: RE | Admit: 2009-09-19 | Discharge: 2009-09-19 | Payer: Self-pay | Admitting: Oncology

## 2009-09-23 ENCOUNTER — Encounter: Admission: RE | Admit: 2009-09-23 | Discharge: 2009-09-23 | Payer: Self-pay | Admitting: Oncology

## 2009-11-21 ENCOUNTER — Ambulatory Visit: Payer: Self-pay | Admitting: Oncology

## 2009-11-23 ENCOUNTER — Encounter: Payer: Self-pay | Admitting: Internal Medicine

## 2009-11-23 LAB — COMPREHENSIVE METABOLIC PANEL
ALT: 13 U/L (ref 0–35)
AST: 22 U/L (ref 0–37)
Albumin: 4.3 g/dL (ref 3.5–5.2)
Calcium: 9.8 mg/dL (ref 8.4–10.5)
Chloride: 99 mEq/L (ref 96–112)
Potassium: 4.2 mEq/L (ref 3.5–5.3)

## 2009-11-25 LAB — CBC WITH DIFFERENTIAL/PLATELET
Basophils Absolute: 0.1 10*3/uL (ref 0.0–0.1)
EOS%: 2.6 % (ref 0.0–7.0)
HGB: 11.9 g/dL (ref 11.6–15.9)
MCH: 34.3 pg — ABNORMAL HIGH (ref 25.1–34.0)
MCV: 97.6 fL (ref 79.5–101.0)
MONO%: 11.6 % (ref 0.0–14.0)
RBC: 3.47 10*6/uL — ABNORMAL LOW (ref 3.70–5.45)
RDW: 12.6 % (ref 11.2–14.5)

## 2010-01-25 ENCOUNTER — Ambulatory Visit: Payer: Self-pay | Admitting: Oncology

## 2010-01-27 ENCOUNTER — Ambulatory Visit (HOSPITAL_COMMUNITY): Admission: RE | Admit: 2010-01-27 | Discharge: 2010-01-27 | Payer: Self-pay | Admitting: Oncology

## 2010-01-27 LAB — TSH: TSH: 2.931 u[IU]/mL (ref 0.350–4.500)

## 2010-01-27 LAB — COMPREHENSIVE METABOLIC PANEL
BUN: 10 mg/dL (ref 6–23)
CO2: 26 mEq/L (ref 19–32)
Calcium: 9.8 mg/dL (ref 8.4–10.5)
Chloride: 97 mEq/L (ref 96–112)
Creatinine, Ser: 0.49 mg/dL (ref 0.40–1.20)

## 2010-01-27 LAB — CBC WITH DIFFERENTIAL/PLATELET
Basophils Absolute: 0 10*3/uL (ref 0.0–0.1)
HCT: 35.5 % (ref 34.8–46.6)
HGB: 12.4 g/dL (ref 11.6–15.9)
MCH: 33.6 pg (ref 25.1–34.0)
MONO#: 0.6 10*3/uL (ref 0.1–0.9)
NEUT%: 70.7 % (ref 38.4–76.8)
lymph#: 0.6 10*3/uL — ABNORMAL LOW (ref 0.9–3.3)

## 2010-02-02 ENCOUNTER — Encounter: Payer: Self-pay | Admitting: Internal Medicine

## 2010-02-06 ENCOUNTER — Ambulatory Visit: Admission: RE | Admit: 2010-02-06 | Discharge: 2010-02-06 | Payer: Self-pay | Admitting: Radiation Oncology

## 2010-05-29 ENCOUNTER — Ambulatory Visit: Admission: RE | Admit: 2010-05-29 | Discharge: 2010-05-29 | Payer: Self-pay | Admitting: Radiation Oncology

## 2010-06-05 ENCOUNTER — Telehealth: Payer: Self-pay | Admitting: Internal Medicine

## 2010-07-21 ENCOUNTER — Other Ambulatory Visit: Payer: Self-pay | Admitting: Oncology

## 2010-07-21 DIAGNOSIS — C760 Malignant neoplasm of head, face and neck: Secondary | ICD-10-CM

## 2010-07-23 ENCOUNTER — Encounter: Payer: Self-pay | Admitting: Obstetrics and Gynecology

## 2010-07-23 ENCOUNTER — Encounter: Payer: Self-pay | Admitting: Radiation Oncology

## 2010-07-24 ENCOUNTER — Encounter: Payer: Self-pay | Admitting: Obstetrics and Gynecology

## 2010-08-03 NOTE — Letter (Signed)
[facility]   Imported By: Maryln Gottron 02/16/2010 13:09:43  _____________________________________________________________________  External Attachment:    Type:   Image     Comment:   External Document

## 2010-08-03 NOTE — Letter (Signed)
[facility]   Imported By: Maryln Gottron 09/29/2009 13:35:26  _____________________________________________________________________  External Attachment:    Type:   Image     Comment:   External Document

## 2010-08-03 NOTE — Letter (Signed)
[facility]   Imported By: Maryln Gottron 12/21/2009 14:40:20  _____________________________________________________________________  External Attachment:    Type:   Image     Comment:   External Document

## 2010-08-03 NOTE — Letter (Signed)
Summary: [facility]  [facility]   Imported By: Maryln Gottron 07/08/2009 12:55:07  _____________________________________________________________________  External Attachment:    Type:   Image     Comment:   External Document

## 2010-08-03 NOTE — Progress Notes (Signed)
Summary: Needs a rov  Phone Note Outgoing Call Call back at [facility] Phone [phone]   Call placed by: Romualdo Bolk, CMA Duncan Dull),  June 05, 2010 4:13 PM Call placed to: Patient Summary of Call: Pt needs to schedule a follow up appt on her bp. LMTOCB Initial call taken by: Romualdo Bolk, CMA (AAMA),  June 05, 2010 4:13 PM  Follow-up for Phone Call        Spoke to pt and she has no health insurance at this time. She states that her bp was high the last time because he was going down her throat with a scope. So she had some anxiety. She is going on medicare at the end of next month, so she will call back to schedule this sometime in Feb.  Follow-up by: Romualdo Bolk, CMA Duncan Dull),  June 06, 2010 4:11 PM  Additional Follow-up for Phone Call Additional follow up Details #1::        noted  Additional Follow-up by: Madelin Headings MD,  June 07, 2010 9:49 AM

## 2010-08-15 ENCOUNTER — Encounter (HOSPITAL_BASED_OUTPATIENT_CLINIC_OR_DEPARTMENT_OTHER): Payer: MEDICARE | Admitting: Oncology

## 2010-08-15 ENCOUNTER — Other Ambulatory Visit: Payer: Self-pay | Admitting: Oncology

## 2010-08-15 DIAGNOSIS — C12 Malignant neoplasm of pyriform sinus: Secondary | ICD-10-CM

## 2010-08-15 DIAGNOSIS — C50419 Malignant neoplasm of upper-outer quadrant of unspecified female breast: Secondary | ICD-10-CM

## 2010-08-15 LAB — COMPREHENSIVE METABOLIC PANEL
AST: 19 U/L (ref 0–37)
BUN: 10 mg/dL (ref 6–23)
Calcium: 10.1 mg/dL (ref 8.4–10.5)
Chloride: 96 mEq/L (ref 96–112)
Creatinine, Ser: 0.42 mg/dL (ref 0.40–1.20)
Total Bilirubin: 0.3 mg/dL (ref 0.3–1.2)

## 2010-08-15 LAB — CBC WITH DIFFERENTIAL/PLATELET
Basophils Absolute: 0 10*3/uL (ref 0.0–0.1)
EOS%: 1.7 % (ref 0.0–7.0)
HCT: 33.8 % — ABNORMAL LOW (ref 34.8–46.6)
HGB: 11.7 g/dL (ref 11.6–15.9)
LYMPH%: 8.6 % — ABNORMAL LOW (ref 14.0–49.7)
MCH: 32.8 pg (ref 25.1–34.0)
MCV: 94.7 fL (ref 79.5–101.0)
MONO%: 11.1 % (ref 0.0–14.0)
NEUT%: 78.1 % — ABNORMAL HIGH (ref 38.4–76.8)
Platelets: 411 10*3/uL — ABNORMAL HIGH (ref 145–400)
lymph#: 0.7 10*3/uL — ABNORMAL LOW (ref 0.9–3.3)

## 2010-08-17 ENCOUNTER — Other Ambulatory Visit: Payer: Self-pay | Admitting: Oncology

## 2010-08-17 ENCOUNTER — Ambulatory Visit (HOSPITAL_COMMUNITY)
Admission: RE | Admit: 2010-08-17 | Discharge: 2010-08-17 | Disposition: A | Discharge status: Home / Self Care | Payer: MEDICARE | Source: Ambulatory Visit | Attending: Oncology | Admitting: Oncology

## 2010-08-17 DIAGNOSIS — M4802 Spinal stenosis, cervical region: Secondary | ICD-10-CM | POA: Insufficient documentation

## 2010-08-17 DIAGNOSIS — Z09 Encounter for follow-up examination after completed treatment for conditions other than malignant neoplasm: Secondary | ICD-10-CM | POA: Insufficient documentation

## 2010-08-17 DIAGNOSIS — I6529 Occlusion and stenosis of unspecified carotid artery: Secondary | ICD-10-CM | POA: Insufficient documentation

## 2010-08-17 DIAGNOSIS — C140 Malignant neoplasm of pharynx, unspecified: Secondary | ICD-10-CM

## 2010-08-17 DIAGNOSIS — M47812 Spondylosis without myelopathy or radiculopathy, cervical region: Secondary | ICD-10-CM | POA: Insufficient documentation

## 2010-08-17 DIAGNOSIS — C760 Malignant neoplasm of head, face and neck: Secondary | ICD-10-CM

## 2010-08-17 DIAGNOSIS — C109 Malignant neoplasm of oropharynx, unspecified: Secondary | ICD-10-CM | POA: Insufficient documentation

## 2010-08-17 MED ORDER — IOHEXOL 300 MG/ML  SOLN
100.0000 mL | Freq: Once | INTRAMUSCULAR | Status: AC | PRN
  Administered 2010-08-17: 100 mL via INTRAVENOUS

## 2010-08-18 ENCOUNTER — Encounter: Payer: MEDICARE | Admitting: Oncology

## 2010-08-18 ENCOUNTER — Other Ambulatory Visit: Payer: Self-pay | Admitting: Oncology

## 2010-08-18 DIAGNOSIS — C779 Secondary and unspecified malignant neoplasm of lymph node, unspecified: Secondary | ICD-10-CM

## 2010-08-21 ENCOUNTER — Other Ambulatory Visit: Payer: Self-pay | Admitting: Oncology

## 2010-08-21 DIAGNOSIS — Z9889 Other specified postprocedural states: Secondary | ICD-10-CM

## 2010-09-25 LAB — GLUCOSE, CAPILLARY: Glucose-Capillary: 121 mg/dL — ABNORMAL HIGH (ref 70–99)

## 2010-09-26 ENCOUNTER — Ambulatory Visit
Admission: RE | Admit: 2010-09-26 | Discharge: 2010-09-26 | Disposition: A | Discharge status: Home / Self Care | Payer: MEDICARE | Source: Ambulatory Visit | Attending: Oncology | Admitting: Oncology

## 2010-09-26 DIAGNOSIS — Z9889 Other specified postprocedural states: Secondary | ICD-10-CM

## 2010-10-03 LAB — CROSSMATCH: ABO/RH(D): B POS

## 2010-10-06 LAB — APTT: aPTT: 26 seconds (ref 24–37)

## 2010-10-06 LAB — CBC
HCT: 37.7 % (ref 36.0–46.0)
HCT: 39.5 % (ref 36.0–46.0)
MCHC: 34.5 g/dL (ref 30.0–36.0)
MCHC: 34.8 g/dL (ref 30.0–36.0)
MCV: 94.5 fL (ref 78.0–100.0)
MCV: 94.5 fL (ref 78.0–100.0)
Platelets: 473 10*3/uL — ABNORMAL HIGH (ref 150–400)
Platelets: 375 10*3/uL (ref 150–400)
RDW: 12.6 % (ref 11.5–15.5)
RDW: 12.3 % (ref 11.5–15.5)
RDW: 13.2 % (ref 11.5–15.5)
WBC: 6.9 10*3/uL (ref 4.0–10.5)

## 2010-10-06 LAB — BASIC METABOLIC PANEL
CO2: 28 mEq/L (ref 19–32)
Calcium: 10.3 mg/dL (ref 8.4–10.5)
Chloride: 100 mEq/L (ref 96–112)
Creatinine, Ser: 0.39 mg/dL — ABNORMAL LOW (ref 0.4–1.2)
Glucose, Bld: 112 mg/dL — ABNORMAL HIGH (ref 70–99)

## 2010-10-06 LAB — PROTIME-INR
INR: 0.9 (ref 0.00–1.49)
Prothrombin Time: 11.5 seconds — ABNORMAL LOW (ref 11.6–15.2)

## 2010-10-30 ENCOUNTER — Ambulatory Visit: Payer: MEDICARE | Attending: Radiation Oncology | Admitting: Radiation Oncology

## 2010-11-14 NOTE — Op Note (Signed)
NAMEFABLE, HUISMAN NO.:  [id number]   MEDICAL RECORD NO.:  [id number]          PATIENT TYPE:  AMB   LOCATION:  DSC                          FACILITY:  [facility]   PHYSICIAN:  Currie Paris, M.D.DATE OF BIRTH:  [birth year]   DATE OF PROCEDURE:  04/30/2007  DATE OF DISCHARGE:                               OPERATIVE REPORT   [facility]S [phone]   PREOPERATIVE DIAGNOSIS:  Carcinoma right breast status post lumpectomy.   POSTOPERATIVE DIAGNOSIS:  Carcinoma right breast status post lumpectomy.   OPERATION:  Placement of Port-A-Cath.   SURGEON:  Currie Paris, M.D.   ANESTHESIA:  MAC.   CLINICAL HISTORY:  Ms. Schweppe is a 66 year old lady getting ready to  start chemotherapy for her breast cancer.  She needs Port-A-Cath for IV  access for her chemo.   DESCRIPTION OF PROCEDURE:  The patient was seen in the holding area and  she had no further questions.  We reviewed again the plans procedures,  etc.  She is not starting chemo until next week so there was no plan to  leave it accessed today.   The patient was then taken to the operating room and after satisfactory  IV sedation had been given, the upper chest and lower neck area was  prepped, draped as a sterile field.  The time-out was performed.   The 1% Xylocaine was infiltrated under the left infraclavicular fossa  and the left subclavian vein accessed on initial attempt and the  guidewire threaded easily into superior vena cava confirmed by fluoro.   Additional local was infiltrated on the anterior chest wall on the left  side.  A transverse incision made.  Using cautery a subcutaneous pocket  was fashioned for the reservoir.  Additional local was infiltrated  between here and the guidewire entry site.  The Port-A-Cath tubing was  pulled in through a subcutaneous tunnel.   The patient was in reverse Trendelenburg again and the dilator peel-away  sheath were placed over the guidewire and this went easily.   The dilator  and guidewire were removed and the tubing inserted to approximately 22  cm.  The peel-away sheath was removed.  The catheter aspirated and  flushed easily.   Using fluoro I could see I appeared to be in the right atrium and I  backed the catheter up to approximately 18.5 cm from the skin.  Here I  appeared to be in the superior vena cava nicely positioned below the  bifurcation of the trachea but above the atrium.  The catheter again  aspirated and flushed easily.  The reservoir was attached aspirated and  flushed easily.   The reservoir was then attached to the fascia with two sutures of 2-0  Prolene.  A final fluoro was made and we appeared to have good  positioning with no kinks.   The catheter was aspirated, flushed with dilute heparin followed by 5 mL  of concentrated heparin.   Incision was closed with 3-0 Vicryl, 4-0 Monocryl subcuticular and  Dermabond.   The patient tolerated procedure well.  There no operative complications.  All counts were correct.  Currie Paris, M.D.  Electronically Signed     CJS/MEDQ  D:  04/30/2007  T:  04/30/2007  Job:  [id number]   cc:   Pierce Crane, MD  Neta Mends. Fabian Sharp, MD

## 2010-11-14 NOTE — Op Note (Signed)
Taylor [name], KUNA NO.:  [id number]   MEDICAL RECORD NO.:  [id number]          PATIENT TYPE:  AMB   LOCATION:  SDS                          FACILITY:  [facility]   PHYSICIAN:  Currie Paris, M.D.DATE OF BIRTH:  1946/05/07   DATE OF PROCEDURE:  03/28/2007  DATE OF DISCHARGE:                               OPERATIVE REPORT   OFFICE MEDICAL RECORD NUMBER:  CCS [id number]   PREOPERATIVE DIAGNOSIS:  Carcinoma, right breast, lower outer quadrant.   POSTOPERATIVE DIAGNOSIS:  Carcinoma, right breast, lower outer quadrant.   OPERATION:  Right partial mastectomy with blue dye injection and  axillary sentinel lymph node dissection.   SURGEON:  Currie Paris, M.D.   ANESTHESIA:  General.   CLINICAL HISTORY:  Ms. Taylor [name] is a 65 year old lady with a recently  found right breast mass which looked to be about the 9 o'clock position,  but more towards the lower outer quadrant than directly midline.  She  had a palpable abnormality when I saw her in the office.  Workup was  otherwise negative.   DETAILS OF PROCEDURE:  The patient was seen in the holding area and she  had no further questions.  We confirmed that right lumpectomy (partial  mastectomy) and sentinel lymph node evaluation with possible node  dissection were the planned procedure.  The right side was initialed as  the operative side.   The patient taken to the operating room and after satisfactory general  anesthesia had been obtained, the area around the nipple was prepped  with some alcohol and a time-out was done.  I then injected 5 mL of  dilute methylene blue subareolarly and massaged this in.   The entire breast and axillary area were prepped and draped as a sterile  field.  The Neoprobe was used to identify a hot area in the axilla and a  transverse incision made.  Using the NeoProbe for direction, I went a  little deeper, found a little plexus of blue lymph vessels and traced  those into a hot  blue node, which was removed.  Using the Neoprobe, I  found another little hot area and this had a second node and further  dissection and use of the NeoProbe showed a third node that had a little  blue lymphatic leading into it, but had not turned blue.  All 3 of these  were removed and sent for specimen mammography.   Attention was turned back to the breast.  The palpable abnormality that  I had noted in the office was no longer palpable, but I had believed  that the tumor was just off of the midline just below, so I made a  transverse incision, divided subcutaneous tissues and staying a little  more superior and medially initially so it was going to be wide-margined  on that area, I went down to the chest wall and then began removing  breast tissue, starting lateral, working medial, and started superior,  working inferior.  As I got near the areolar edge, I found the mass as I  had almost gotten into it coming around that area.  I backed up and got  more tissue, so I thought I had a grossly negative margin.  This was  labeled and sent for touch preps and I discussed the orientation with  the pathologist.   Because I thought I was close near the medial inferior aspect of the  lumpectomy, I took another approximately centimeter of tissue there and  this left me a very close skin margin because I also took some close to  the anterior margin.   I spent several minutes irrigating, making sure everything was dry.  I  injected some 0.25% Marcaine.  I freed up the fascia inferiorly, since I  was down to muscle, and tried to close the deep layer was 3-0 Vicryls  just to cover the muscle a little bit.  I could not really close the  cavity because of deformities, so I then closed the subcu with 3-0  Vicryl and the skin with a 4-0 Monocryl subcuticular.   At this point, Pathology reported that at least 1 of the nodes was  positive and 2 others had atypical cells.   My attention was turned  back to the axilla and the incision enlarged.  I  divided all the tissue straight down to the chest wall so that I was  down to chest wall from the pectoralis anteriorly to latissimus  posteriorly.  I then opened the clavipectoral fascia and dissected  several nodes out.  We identified and took the second intercostal nerve  and there were a couple of other little branches of nerves that were  taken.  I did find another blue lymph node that was fairly high up and I  think probably was beyond the initial 3 there were taken; this was  included in the specimen.  When I got more superomedial, I was able to  identify the axillary vein, stripped contents from medial to lateral and  superior to inferior, identifying and preserving both long thoracic and  thoracodorsal nerves.  I then divided the final lateral attachments into  the axillary skin subcu and off of the latissimus.  I used clips on most  of the vessels and lymphatics as they were encountered, although cautery  was also used.   Once this was out, I found some other little bits of fatty tissue in the  area between the nerves and removed that and made sure I did not leave  any palpable abnormality behind.   I then irrigated and made sure everything was dry.  I placed a 19 Blake  drain and secured it with a nylon suture.  A final irrigation and  hemostasis were then done and again, everything was dry, so the incision  was closed with 3-0 Vicryl and 4-0 Monocryl subcuticular.   The patient tolerated the procedure well and there were no operative  complications.  All counts were correct.      Currie Paris, M.D.  Electronically Signed     CJS/MEDQ  D:  03/28/2007  T:  03/29/2007  Job:  [id number]   cc:   Neta Mends. Fabian Sharp, MD

## 2010-11-14 NOTE — Op Note (Signed)
[name], Taylor [name] NO.:  [id number]   MEDICAL RECORD NO.:  [id number]          PATIENT TYPE:  AMB   LOCATION:  DSC                          FACILITY:  [facility]   PHYSICIAN:  Currie Paris, M.D.DATE OF BIRTH:  05-04-1946   DATE OF PROCEDURE:  DATE OF DISCHARGE:                               OPERATIVE REPORT   PREOPERATIVE DIAGNOSIS:  Unneeded Port-A-Cath.   POSTOPERATIVE DIAGNOSIS:  Unneeded Port-A-Cath.   OPERATION:  Removal of Port-A-Cath.   SURGEON:  Currie Paris, MD   ANESTHESIA:  Local.   CLINICAL HISTORY:  [name] has completed chemotherapy for breast  cancer and now wished her port removed.   DESCRIPTION OF PROCEDURE:  The patient was seen in the minor procedure  room.  We identified the port site and confirmed the Port-A-Cath removal  was the planned procedure.   The area was then prepped with some alcohol and anesthetized with 1%  Xylocaine with epinephrine.   I waited for 10 minutes for good effect of the epinephrine and then  prepped the area with Betadine.  The scar was reopened.  The Port-A-Cath  was identified and its capsule and 2 holding sutures of Prolene cut.  The Port-A-Cath tubing was backed out a little bit and a purse-string of  4-0 Vicryl was placed around it.  The Port-A-Cath and tubing was backed  out and came out intact.  The suture was tied down and there was no  backbleeding.   Incision was then closed with some 3-0 Vicryl followed by Dermabond on  the skin.  She tolerated the procedure well and there were no  complications.      Currie Paris, M.D.  Electronically Signed     CJS/MEDQ  D:  04/19/2008  T:  04/20/2008  Job:  [id number]

## 2010-12-12 ENCOUNTER — Encounter: Payer: Self-pay | Admitting: Internal Medicine

## 2010-12-19 ENCOUNTER — Encounter (HOSPITAL_BASED_OUTPATIENT_CLINIC_OR_DEPARTMENT_OTHER): Payer: Medicare Other | Admitting: Oncology

## 2010-12-19 ENCOUNTER — Other Ambulatory Visit: Payer: Self-pay | Admitting: Medical

## 2010-12-19 DIAGNOSIS — C50419 Malignant neoplasm of upper-outer quadrant of unspecified female breast: Secondary | ICD-10-CM

## 2010-12-19 DIAGNOSIS — C12 Malignant neoplasm of pyriform sinus: Secondary | ICD-10-CM

## 2010-12-19 LAB — CBC WITH DIFFERENTIAL/PLATELET
BASO%: 0.5 % (ref 0.0–2.0)
Basophils Absolute: 0 10*3/uL (ref 0.0–0.1)
EOS%: 1.7 % (ref 0.0–7.0)
HGB: 11.9 g/dL (ref 11.6–15.9)
MCH: 33.3 pg (ref 25.1–34.0)
MCHC: 34.6 g/dL (ref 31.5–36.0)
MCV: 96.2 fL (ref 79.5–101.0)
MONO%: 10.1 % (ref 0.0–14.0)
RDW: 12.7 % (ref 11.2–14.5)

## 2010-12-20 ENCOUNTER — Ambulatory Visit (HOSPITAL_COMMUNITY)
Admission: RE | Admit: 2010-12-20 | Discharge: 2010-12-20 | Disposition: A | Discharge status: Home / Self Care | Payer: Medicare Other | Source: Ambulatory Visit | Attending: Oncology | Admitting: Oncology

## 2010-12-20 DIAGNOSIS — E041 Nontoxic single thyroid nodule: Secondary | ICD-10-CM | POA: Insufficient documentation

## 2010-12-20 DIAGNOSIS — I6529 Occlusion and stenosis of unspecified carotid artery: Secondary | ICD-10-CM | POA: Insufficient documentation

## 2010-12-20 DIAGNOSIS — I658 Occlusion and stenosis of other precerebral arteries: Secondary | ICD-10-CM | POA: Insufficient documentation

## 2010-12-20 DIAGNOSIS — C12 Malignant neoplasm of pyriform sinus: Secondary | ICD-10-CM | POA: Insufficient documentation

## 2010-12-20 DIAGNOSIS — C50919 Malignant neoplasm of unspecified site of unspecified female breast: Secondary | ICD-10-CM | POA: Insufficient documentation

## 2010-12-20 DIAGNOSIS — M47812 Spondylosis without myelopathy or radiculopathy, cervical region: Secondary | ICD-10-CM | POA: Insufficient documentation

## 2010-12-20 DIAGNOSIS — C779 Secondary and unspecified malignant neoplasm of lymph node, unspecified: Secondary | ICD-10-CM

## 2010-12-20 LAB — COMPREHENSIVE METABOLIC PANEL
ALT: 14 U/L (ref 0–35)
AST: 26 U/L (ref 0–37)
Albumin: 4.5 g/dL (ref 3.5–5.2)
Alkaline Phosphatase: 120 U/L — ABNORMAL HIGH (ref 39–117)
BUN: 15 mg/dL (ref 6–23)
Creatinine, Ser: 0.58 mg/dL (ref 0.50–1.10)
Potassium: 3.9 mEq/L (ref 3.5–5.3)

## 2010-12-20 MED ORDER — IOHEXOL 300 MG/ML  SOLN
100.0000 mL | Freq: Once | INTRAMUSCULAR | Status: AC | PRN
  Administered 2010-12-20: 100 mL via INTRAVENOUS

## 2010-12-22 ENCOUNTER — Encounter (HOSPITAL_BASED_OUTPATIENT_CLINIC_OR_DEPARTMENT_OTHER): Payer: Medicare Other | Admitting: Oncology

## 2010-12-22 DIAGNOSIS — C50919 Malignant neoplasm of unspecified site of unspecified female breast: Secondary | ICD-10-CM

## 2010-12-22 DIAGNOSIS — C12 Malignant neoplasm of pyriform sinus: Secondary | ICD-10-CM

## 2010-12-26 ENCOUNTER — Other Ambulatory Visit: Payer: Self-pay | Admitting: Gastroenterology

## 2010-12-26 DIAGNOSIS — C140 Malignant neoplasm of pharynx, unspecified: Secondary | ICD-10-CM

## 2010-12-27 ENCOUNTER — Ambulatory Visit (INDEPENDENT_AMBULATORY_CARE_PROVIDER_SITE_OTHER): Payer: Medicare Other | Admitting: Internal Medicine

## 2010-12-27 ENCOUNTER — Encounter: Payer: Self-pay | Admitting: Internal Medicine

## 2010-12-27 ENCOUNTER — Ambulatory Visit
Admission: RE | Admit: 2010-12-27 | Discharge: 2010-12-27 | Disposition: A | Discharge status: Home / Self Care | Payer: Medicare Other | Source: Ambulatory Visit | Attending: Gastroenterology | Admitting: Gastroenterology

## 2010-12-27 VITALS — BP 130/80 | HR 66 | Ht 63.75 in | Wt 110.0 lb

## 2010-12-27 DIAGNOSIS — Z853 Personal history of malignant neoplasm of breast: Secondary | ICD-10-CM

## 2010-12-27 DIAGNOSIS — C140 Malignant neoplasm of pharynx, unspecified: Secondary | ICD-10-CM | POA: Insufficient documentation

## 2010-12-27 DIAGNOSIS — Z91120 Patient's intentional underdosing of medication regimen due to financial hardship: Secondary | ICD-10-CM

## 2010-12-27 DIAGNOSIS — E785 Hyperlipidemia, unspecified: Secondary | ICD-10-CM

## 2010-12-27 DIAGNOSIS — I10 Essential (primary) hypertension: Secondary | ICD-10-CM

## 2010-12-27 DIAGNOSIS — Y639 Failure in dosage during unspecified surgical and medical care: Secondary | ICD-10-CM

## 2010-12-27 DIAGNOSIS — F17200 Nicotine dependence, unspecified, uncomplicated: Secondary | ICD-10-CM

## 2010-12-27 DIAGNOSIS — M129 Arthropathy, unspecified: Secondary | ICD-10-CM

## 2010-12-27 DIAGNOSIS — M1990 Unspecified osteoarthritis, unspecified site: Secondary | ICD-10-CM

## 2010-12-27 DIAGNOSIS — R634 Abnormal weight loss: Secondary | ICD-10-CM

## 2010-12-27 DIAGNOSIS — R1310 Dysphagia, unspecified: Secondary | ICD-10-CM | POA: Insufficient documentation

## 2010-12-27 LAB — CBC WITH DIFFERENTIAL/PLATELET
Basophils Relative: 0.5 % (ref 0.0–3.0)
Eosinophils Relative: 1.4 % (ref 0.0–5.0)
HCT: 36.5 % (ref 36.0–46.0)
Hemoglobin: 12.6 g/dL (ref 12.0–15.0)
Lymphs Abs: 0.8 10*3/uL (ref 0.7–4.0)
Monocytes Relative: 13.7 % — ABNORMAL HIGH (ref 3.0–12.0)
Neutro Abs: 4.8 10*3/uL (ref 1.4–7.7)
RBC: 3.73 Mil/uL — ABNORMAL LOW (ref 3.87–5.11)
WBC: 6.7 10*3/uL (ref 4.5–10.5)

## 2010-12-27 LAB — TSH: TSH: 3.57 u[IU]/mL (ref 0.35–5.50)

## 2010-12-27 LAB — HEPATIC FUNCTION PANEL
ALT: 19 U/L (ref 0–35)
AST: 27 U/L (ref 0–37)
Albumin: 4.4 g/dL (ref 3.5–5.2)
Total Bilirubin: 0.7 mg/dL (ref 0.3–1.2)

## 2010-12-27 LAB — LIPID PANEL
HDL: 168 mg/dL (ref >39.00)
VLDL: 16.6 mg/dL (ref 0.0–40.0)

## 2010-12-27 LAB — BASIC METABOLIC PANEL
GFR: 165.27 mL/min (ref >60.00)
Potassium: 5.8 mEq/L — ABNORMAL HIGH (ref 3.5–5.1)
Sodium: 135 mEq/L (ref 135–145)

## 2010-12-27 MED ORDER — VALSARTAN 80 MG PO TABS: 80.0000 mg | ORAL_TABLET | Freq: Every day | ORAL | Status: DC

## 2010-12-27 NOTE — Progress Notes (Signed)
Subjective:    Patient ID: Taylor [name], female    DOB: Nov 27, 1945, 65 y.o.   MRN: [id number]  HPI Patient comes in today to reestablish as a patient now that she  Is on Medicare. She was a patient her over 3 years ago and had HT Elevated lipids and tobacco dependence then developed breast cancer . After ward developed throat cancer under rx and follow.    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 Since the she has had weight loss from her disease although has bounced back.. Using soft smoothies ensure to maintain.  Having current problems with throat constriction. Poss from radiation and rx for the cancer . It is unclear if any recurrence To get throat  esophagus stretched   Per Dr Matthias Hughs.   Breathing is  Good.  Says she has stopped tobacco recently  Has had se of tobacco cessation aids and wants to do it herself.  Is aware she needs to be tobacco free.  Care team includes : Dr Gaylyn Rong  Dr Buccinni  DR. Kinard   Control and instrumentation engineer.  Every 4 months   Cleaning decrease saliva.   Review of Systems ROS:  GEN/ HEENTNo fever, significant weight changes sweats headaches vision problems hearing changes, CV/ PULM; No chest pain shortness of breath cough, syncope,edema  change in exercise tolerance. GI /GU: No adominal pain, vomiting, change in bowel habits. No blood in the stool. No significant GU symptoms. Has change on taste and hard to swallow on soft foods and liquids  Has seen nutritionist.    SKIN/HEME: ,no acute skin rashes suspicious lesions or bleeding. No lymphadenopathy, nodules, masses.  NEURO/ PSYCH:  No neurologic signs such as weakness numbness No depression anxiety. Better after chemo for breast cancer.  Almost normal now.  IMM/ Allergy: No unusual infections.  Allergy .  None  REST of 12 system review negative   Past Medical History  Diagnosis Date  . Hyperlipidemia   . Hypertension   . Headache   . Rhinitis   . Hx of breast cancer   . Throat cancer 2010   Past  Surgical History  Procedure Date  . Breast lumpectomy     reports that she has quit smoking. She does not have any smokeless tobacco history on file. She reports that she drinks about 4.2 ounces of alcohol per week. She reports that she does not use illicit drugs. family history includes Hyperlipidemia in her mother; Hypertension in her mother and unspecified family member; Rheum arthritis in an unspecified family member; and Throat cancer in her father and unspecified family member. Allergies  Allergen Reactions  . Ace Inhibitors     REACTION: cough  . Sulfamethoxazole     REACTION: rash       Objective:   Physical Exam WDWn but slneder in nad  Bp righ arm 140/70  Physical Exam: Vital signs reviewed WUJ:WJXB is a well-developed well-nourished alert cooperative  white female who appears her stated age in no acute distress.  HEENT: normocephalic  traumatic , Eyes: PERRL EOM's full, conjunctiva clear, Nares: paten,t no deformity discharge or tenderness., Ears: no deformity EAC's clear TMs with normal landmarks. Mouth: clear OP, no lesions, edema. . Dentition  Stable  . NECK: thickening and fibrosis mass effect? Left neck  no thyromegaly or bruits. CHEST/PULM:  Clear to auscultation and percussion breath sounds equal no wheeze , rales or rhonchi. No chest wall deformities or tenderness. CV: PMI is nondisplaced, S1 S2 no gallops,  murmurs, rubs. Peripheral pulses are full without delay.No JVD .  ABDOMEN: Bowel sounds normal nontender  No guard or rebound, no hepato splenomegal no CVA tenderness.   Extremtities:  No clubbing cyanosis or edema, no acute joint swelling or redness no focal atrophy  oa changes  No redness or warmth NEURO:  Oriented x3, cranial nerves 3-12 appear to be intact, no obvious focal weakness,gait within normal limits no abnormal reflexes or asymmetrical SKIN: No acute rashes normal turgor, color, no bruising or petechiae. PSYCH: Oriented, good eye contact, no obvious  depression anxiety, cognition and judgment appear normal.       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 To get  EGD tomorrow to check  No t due for colonoscopy  Yet ?   Tobacco  Says she currently is tobacco free but is off and on Counseled. About risk of continuing she is aware.  Wants to avoid side effects of rx meds. PHC Pt declined pneumovax today ? If already had this..   Disc shingles vaccine Financial: no insurance until medicare age .    follow

## 2010-12-27 NOTE — Patient Instructions (Addendum)
Will notify you  of labs when available. Get BP readings at least 3 x per week. Then ROV medicare wellness in 1 month  to decide on medication if advised.  Consider    Shingles vaccine and pneumovax   If not done  Previously  .  Call with readings consistently  above 140 cal and we can start the diovan 40 or 80 mg per day.

## 2010-12-28 ENCOUNTER — Ambulatory Visit (HOSPITAL_COMMUNITY)
Admission: RE | Admit: 2010-12-28 | Discharge: 2010-12-28 | Disposition: A | Discharge status: Home / Self Care | Payer: Medicare Other | Source: Ambulatory Visit | Attending: Gastroenterology | Admitting: Gastroenterology

## 2010-12-28 ENCOUNTER — Ambulatory Visit (HOSPITAL_COMMUNITY): Payer: Medicare Other

## 2010-12-28 DIAGNOSIS — Z923 Personal history of irradiation: Secondary | ICD-10-CM | POA: Insufficient documentation

## 2010-12-28 DIAGNOSIS — Z8589 Personal history of malignant neoplasm of other organs and systems: Secondary | ICD-10-CM | POA: Insufficient documentation

## 2010-12-28 DIAGNOSIS — I10 Essential (primary) hypertension: Secondary | ICD-10-CM | POA: Insufficient documentation

## 2010-12-28 DIAGNOSIS — K222 Esophageal obstruction: Secondary | ICD-10-CM | POA: Insufficient documentation

## 2010-12-28 DIAGNOSIS — Z9221 Personal history of antineoplastic chemotherapy: Secondary | ICD-10-CM | POA: Insufficient documentation

## 2010-12-28 DIAGNOSIS — K449 Diaphragmatic hernia without obstruction or gangrene: Secondary | ICD-10-CM | POA: Insufficient documentation

## 2010-12-28 DIAGNOSIS — R1310 Dysphagia, unspecified: Secondary | ICD-10-CM | POA: Insufficient documentation

## 2010-12-28 DIAGNOSIS — R070 Pain in throat: Secondary | ICD-10-CM | POA: Insufficient documentation

## 2010-12-31 ENCOUNTER — Encounter: Payer: Self-pay | Admitting: Internal Medicine

## 2011-01-05 NOTE — Op Note (Signed)
Taylor [name], DUCEY NO.:  [id number]  MEDICAL RECORD NO.:  [id number]  LOCATION:  WLEN                         FACILITY:  [facility]  PHYSICIAN:  Bernette Redbird, M.D.   DATE OF BIRTH:  February 25, 1946  DATE OF PROCEDURE:  12/28/2010 DATE OF DISCHARGE:                              OPERATIVE REPORT   PROCEDURE:  Upper endoscopy with through the scope dilatation of the esophagus.  INDICATIONS:  The patient is a 65 year old female with history of throat cancer status post external beam radiation therapy completed about a year and a half ago, now with the recent onset of progressively frequent and severe dysphagia episodes and associated pain.  Interestingly, the dysphagia symptoms subjectively are felt in the lower sternal area, not in the cervical esophageal region where the patient previously had radiation treatments.  Moreover, a barium swallow yesterday showed a tight esophageal stricture in the distal esophagus, with impediment to passage of the 13 mm barium tablet at that location but no problem was noted in the cervical esophageal region.  This procedure is being done to dilate the esophagus to relieve the patient's dysphagia symptoms and associated pain.  FINDINGS:  Tight Schatzki's ring dilated to 15 mm by through the scope balloon technique.  PROCEDURE:  The nature, purpose and risks of the procedure have been discussed with the patient who came as an outpatient to the [facility] and provided written consent.  Sedation was propofol and Versed by the anesthesia team.  The Pentax video endoscope was passed under direct vision.  The vocal cords were not well seen but the larynx looked grossly normal and no obvious vestige of her previous throat cancer was seen during passage of the scope, although a detailed inspection was not performed for that purpose.  The esophagus was entered without undue difficulty and had mucosa that was free of any  obvious inflammation, infection, varices or neoplasia.  At the squamocolumnar junction, there was a wafer-thin circumferential ring consistent with a Schatzki ring, below which was a very small hiatal hernia.  This ring was sufficiently typed that the 10-mm endoscope could not be passed through it.  Therefore, through the scope, balloon dilatation was then performed, initially with a 10 mm balloon that did not seem to experience resistance, then 12 mm which was held inflated for 1 minute.  After letting down the balloon, it was apparent that the ring had been nicely fractured and there was a small amount of fresh hemorrhage but no evidence of active bleeding or perforation.  We then switched out to a larger balloon which was inflated to 15 mm and held for 1 minute.  After this, the balloon was removed, and the patient was endoscoped in the remainder of the upper GI tract.  At this point, the scope passed with no resistance, very easily, through the area of the ring which previously had been unable to pass.  The stomach contained no significant residual and had normal mucosa including a retroflex view of the cardia.  No erosions, ulcers, polyps or masses were seen in the stomach and the pylorus, duodenal bulb, and second duodenum looked normal.  The scope was removed from the patient, tolerated  the procedure well without apparent complications.  No biopsies were obtained.  IMPRESSION:  Tight Schatzki's ring, luminal diameter less than 10 mm, dilated successfully to 15 mm by through the scope technique as described above.  Also, small hiatal hernia but no evidence of active inflammation from acid.  PLAN:  Clinical followup of her dysphagia symptoms.          ______________________________ Bernette Redbird, M.D.     RB/MEDQ  D:  12/28/2010  T:  12/28/2010  Job:  [id number]  cc:   Exie Parody, M.D.  Neta Mends. Fabian Sharp, MD [address] [place], [place] [address]  Electronically  Signed by Bernette Redbird M.D. on 01/05/2011 12:38:58 PM

## 2011-01-10 ENCOUNTER — Encounter (INDEPENDENT_AMBULATORY_CARE_PROVIDER_SITE_OTHER): Payer: Self-pay | Admitting: General Surgery

## 2011-01-12 ENCOUNTER — Encounter (INDEPENDENT_AMBULATORY_CARE_PROVIDER_SITE_OTHER): Payer: Medicare Other | Admitting: General Surgery

## 2011-01-15 ENCOUNTER — Encounter: Payer: Self-pay | Admitting: *Deleted

## 2011-01-26 ENCOUNTER — Telehealth: Payer: Self-pay | Admitting: *Deleted

## 2011-01-26 ENCOUNTER — Ambulatory Visit: Payer: Medicare Other | Admitting: Internal Medicine

## 2011-01-26 NOTE — Telephone Encounter (Signed)
patient  Calling to know if she should start her blood pressure medication.  Yes per dr Fabian Sharp.  She should schedule a follow up visit in one month and bring her blood pressure reading with her.  patient  Understood.

## 2011-02-07 ENCOUNTER — Other Ambulatory Visit: Payer: Self-pay | Admitting: Oncology

## 2011-02-07 DIAGNOSIS — C50919 Malignant neoplasm of unspecified site of unspecified female breast: Secondary | ICD-10-CM

## 2011-02-20 ENCOUNTER — Telehealth: Payer: Self-pay | Admitting: *Deleted

## 2011-02-20 NOTE — Telephone Encounter (Signed)
Pt would like Shannon to call her about BP meds

## 2011-02-20 NOTE — Telephone Encounter (Signed)
Spoke to pt- she started taking diovan 80mg  but she only took 40mg  for 1 week. Last Sunday- she was so weak and tired and d/c bp medication 1 week ago. Pt is not taking any bp medications. She her bp is 133/71 lowest and 153/84 highest.

## 2011-02-20 NOTE — Telephone Encounter (Signed)
Pt states that she gave [place] the wrong readings and is going to fax [place] over the readings. Her readings are 02/05/11 136/108 with meds, 02/06/11 152/85 without meds; 02/07/11 153/84 no meds; 02/08/11 128/77 no meds; 02/12/11 133/71 no meds no smoking and 02/12/11 142/76.

## 2011-02-20 NOTE — Telephone Encounter (Signed)
Per Dr. Fabian Sharp- Stay off short term and fax [place] bp readings in 3-4 weeks. Pt aware of this.

## 2011-02-26 ENCOUNTER — Ambulatory Visit
Admission: RE | Admit: 2011-02-26 | Discharge: 2011-02-26 | Disposition: A | Discharge status: Home / Self Care | Payer: Medicare Other | Source: Ambulatory Visit | Attending: Radiation Oncology | Admitting: Radiation Oncology

## 2011-02-26 DIAGNOSIS — C139 Malignant neoplasm of hypopharynx, unspecified: Secondary | ICD-10-CM | POA: Insufficient documentation

## 2011-03-14 ENCOUNTER — Encounter (HOSPITAL_BASED_OUTPATIENT_CLINIC_OR_DEPARTMENT_OTHER): Payer: Medicare Other | Admitting: Oncology

## 2011-03-14 ENCOUNTER — Ambulatory Visit (HOSPITAL_COMMUNITY)
Admission: RE | Admit: 2011-03-14 | Discharge: 2011-03-14 | Disposition: A | Discharge status: Home / Self Care | Payer: Medicare Other | Source: Ambulatory Visit | Attending: Oncology | Admitting: Oncology

## 2011-03-14 ENCOUNTER — Other Ambulatory Visit: Payer: Self-pay | Admitting: Oncology

## 2011-03-14 ENCOUNTER — Encounter (HOSPITAL_COMMUNITY): Payer: Self-pay

## 2011-03-14 DIAGNOSIS — Z9089 Acquired absence of other organs: Secondary | ICD-10-CM | POA: Insufficient documentation

## 2011-03-14 DIAGNOSIS — C50919 Malignant neoplasm of unspecified site of unspecified female breast: Secondary | ICD-10-CM

## 2011-03-14 DIAGNOSIS — M542 Cervicalgia: Secondary | ICD-10-CM | POA: Insufficient documentation

## 2011-03-14 DIAGNOSIS — Z8522 Personal history of malignant neoplasm of nasal cavities, middle ear, and accessory sinuses: Secondary | ICD-10-CM | POA: Insufficient documentation

## 2011-03-14 DIAGNOSIS — M47812 Spondylosis without myelopathy or radiculopathy, cervical region: Secondary | ICD-10-CM | POA: Insufficient documentation

## 2011-03-14 DIAGNOSIS — R599 Enlarged lymph nodes, unspecified: Secondary | ICD-10-CM | POA: Insufficient documentation

## 2011-03-14 DIAGNOSIS — C12 Malignant neoplasm of pyriform sinus: Secondary | ICD-10-CM

## 2011-03-14 DIAGNOSIS — R1310 Dysphagia, unspecified: Secondary | ICD-10-CM | POA: Insufficient documentation

## 2011-03-14 HISTORY — DX: Malignant neoplasm of unspecified site of unspecified female breast: C50.919

## 2011-03-14 LAB — CMP (CANCER CENTER ONLY)
ALT(SGPT): 22 U/L (ref 10–47)
AST: 30 U/L (ref 11–38)
Albumin: 3.9 g/dL (ref 3.3–5.5)
CO2: 26 mEq/L (ref 18–33)
Calcium: 9.5 mg/dL (ref 8.0–10.3)
Chloride: 96 mEq/L — ABNORMAL LOW (ref 98–108)
Potassium: 5.5 mEq/L — ABNORMAL HIGH (ref 3.3–4.7)
Total Protein: 8 g/dL (ref 6.4–8.1)

## 2011-03-14 LAB — CBC WITH DIFFERENTIAL/PLATELET
BASO%: 0.5 % (ref 0.0–2.0)
EOS%: 2.1 % (ref 0.0–7.0)
HCT: 37.3 % (ref 34.8–46.6)
HGB: 13.1 g/dL (ref 11.6–15.9)
MCH: 33.3 pg (ref 25.1–34.0)
MCHC: 35 g/dL (ref 31.5–36.0)
MONO#: 0.7 10*3/uL (ref 0.1–0.9)
RDW: 12.6 % (ref 11.2–14.5)
WBC: 7.7 10*3/uL (ref 3.9–10.3)
lymph#: 0.8 10*3/uL — ABNORMAL LOW (ref 0.9–3.3)

## 2011-03-14 LAB — TSH: TSH: 3.641 u[IU]/mL (ref 0.350–4.500)

## 2011-03-14 MED ORDER — IOHEXOL 300 MG/ML  SOLN
100.0000 mL | Freq: Once | INTRAMUSCULAR | Status: AC | PRN
  Administered 2011-03-14: 100 mL via INTRAVENOUS

## 2011-03-15 ENCOUNTER — Encounter (HOSPITAL_BASED_OUTPATIENT_CLINIC_OR_DEPARTMENT_OTHER): Payer: Medicare Other | Admitting: Oncology

## 2011-03-15 DIAGNOSIS — C50919 Malignant neoplasm of unspecified site of unspecified female breast: Secondary | ICD-10-CM

## 2011-03-15 DIAGNOSIS — C12 Malignant neoplasm of pyriform sinus: Secondary | ICD-10-CM

## 2011-04-06 ENCOUNTER — Other Ambulatory Visit (HOSPITAL_COMMUNITY): Payer: Self-pay | Admitting: Otolaryngology

## 2011-04-06 DIAGNOSIS — R599 Enlarged lymph nodes, unspecified: Secondary | ICD-10-CM

## 2011-04-10 ENCOUNTER — Other Ambulatory Visit: Payer: Self-pay | Admitting: Interventional Radiology

## 2011-04-10 ENCOUNTER — Ambulatory Visit (HOSPITAL_COMMUNITY)
Admission: RE | Admit: 2011-04-10 | Discharge: 2011-04-10 | Disposition: A | Discharge status: Home / Self Care | Payer: Medicare Other | Source: Ambulatory Visit | Attending: Otolaryngology | Admitting: Otolaryngology

## 2011-04-10 DIAGNOSIS — Z853 Personal history of malignant neoplasm of breast: Secondary | ICD-10-CM | POA: Insufficient documentation

## 2011-04-10 DIAGNOSIS — F17200 Nicotine dependence, unspecified, uncomplicated: Secondary | ICD-10-CM | POA: Insufficient documentation

## 2011-04-10 DIAGNOSIS — Z01812 Encounter for preprocedural laboratory examination: Secondary | ICD-10-CM | POA: Insufficient documentation

## 2011-04-10 DIAGNOSIS — R599 Enlarged lymph nodes, unspecified: Secondary | ICD-10-CM

## 2011-04-10 DIAGNOSIS — I7300 Raynaud's syndrome without gangrene: Secondary | ICD-10-CM | POA: Insufficient documentation

## 2011-04-10 DIAGNOSIS — C770 Secondary and unspecified malignant neoplasm of lymph nodes of head, face and neck: Secondary | ICD-10-CM | POA: Insufficient documentation

## 2011-04-10 DIAGNOSIS — C760 Malignant neoplasm of head, face and neck: Secondary | ICD-10-CM | POA: Insufficient documentation

## 2011-04-10 LAB — CBC
HCT: 35.9 % — ABNORMAL LOW (ref 36.0–46.0)
Hemoglobin: 12.8 g/dL (ref 12.0–15.0)
MCH: 32.6 pg (ref 26.0–34.0)
MCHC: 35.7 g/dL (ref 30.0–36.0)
MCV: 91.3 fL (ref 78.0–100.0)
RBC: 3.93 MIL/uL (ref 3.87–5.11)

## 2011-04-11 LAB — BASIC METABOLIC PANEL
BUN: 12
CO2: 26
Chloride: 95 — ABNORMAL LOW
Creatinine, Ser: 0.5
Glucose, Bld: 114 — ABNORMAL HIGH

## 2011-04-11 LAB — POCT HEMOGLOBIN-HEMACUE: Hemoglobin: 12.2

## 2011-04-12 LAB — DIFFERENTIAL
Basophils Absolute: 0.1
Basophils Relative: 1
Monocytes Relative: 13 — ABNORMAL HIGH
Neutro Abs: 4.7
Neutrophils Relative %: 63

## 2011-04-12 LAB — URINALYSIS, ROUTINE W REFLEX MICROSCOPIC
Nitrite: NEGATIVE
Specific Gravity, Urine: 1.012
Urobilinogen, UA: 0.2

## 2011-04-12 LAB — COMPREHENSIVE METABOLIC PANEL
Alkaline Phosphatase: 89
BUN: 8
Creatinine, Ser: 0.47
Glucose, Bld: 117 — ABNORMAL HIGH
Potassium: 4.8
Total Bilirubin: 1.1
Total Protein: 7.6

## 2011-04-12 LAB — URINE MICROSCOPIC-ADD ON

## 2011-04-12 LAB — CBC
HCT: 40.4
Hemoglobin: 13.9
MCHC: 34.6
MCV: 93.5
RDW: 12.2

## 2011-04-15 ENCOUNTER — Encounter: Payer: Self-pay | Admitting: Oncology

## 2011-04-24 ENCOUNTER — Telehealth: Payer: Self-pay | Admitting: *Deleted

## 2011-04-24 NOTE — Telephone Encounter (Signed)
Wants Taylor [name] to call her and give her fax number.has bp readings

## 2011-04-25 NOTE — Telephone Encounter (Signed)
Called and left fax number on pt's voicemail.

## 2011-05-01 ENCOUNTER — Telehealth: Payer: Self-pay | Admitting: *Deleted

## 2011-05-01 ENCOUNTER — Other Ambulatory Visit: Payer: Self-pay | Admitting: Internal Medicine

## 2011-05-01 NOTE — Telephone Encounter (Signed)
Per Dr. Fabian Sharp- Continue to monitor. Pt aware of this.

## 2011-05-09 ENCOUNTER — Encounter (HOSPITAL_COMMUNITY): Payer: Self-pay | Admitting: Pharmacy Technician

## 2011-05-10 ENCOUNTER — Encounter (HOSPITAL_COMMUNITY): Payer: Self-pay | Admitting: Pharmacy Technician

## 2011-05-10 ENCOUNTER — Encounter (HOSPITAL_COMMUNITY): Payer: Self-pay

## 2011-05-10 ENCOUNTER — Encounter (HOSPITAL_COMMUNITY)
Admission: RE | Admit: 2011-05-10 | Discharge: 2011-05-10 | Disposition: A | Discharge status: Home / Self Care | Payer: Medicare Other | Source: Ambulatory Visit | Attending: Otolaryngology | Admitting: Otolaryngology

## 2011-05-10 LAB — COMPREHENSIVE METABOLIC PANEL
ALT: 12 U/L (ref 0–35)
AST: 19 U/L (ref 0–37)
CO2: 25 mEq/L (ref 19–32)
Calcium: 10.6 mg/dL — ABNORMAL HIGH (ref 8.4–10.5)
Chloride: 99 mEq/L (ref 96–112)
GFR calc Af Amer: >90 mL/min (ref >90)
GFR calc non Af Amer: >90 mL/min (ref >90)
Glucose, Bld: 109 mg/dL — ABNORMAL HIGH (ref 70–99)
Sodium: 135 mEq/L (ref 135–145)
Total Bilirubin: 0.3 mg/dL (ref 0.3–1.2)

## 2011-05-10 LAB — CBC
MCH: 32.9 pg (ref 26.0–34.0)
MCHC: 35.5 g/dL (ref 30.0–36.0)
Platelets: 409 10*3/uL — ABNORMAL HIGH (ref 150–400)
RDW: 12.1 % (ref 11.5–15.5)

## 2011-05-10 LAB — TSH: TSH: 2.371 u[IU]/mL (ref 0.350–4.500)

## 2011-05-10 NOTE — Pre-Procedure Instructions (Signed)
20 Taylor [name]  05/10/2011   Your procedure is scheduled on: Wednesday, November 14TH                                                   Report to [facility] at 5:30 AM.  Call this number if you have problems the morning of surgery: [phone]   Remember:   Do not eat food:After Midnight TUESDAY.   Do not drink clear liquids: 4 Hours before arrival (1:30 AM).   Take these medicines the morning of surgery with A SIP OF WATER:              ATIVAN (IF NEEDED)    Do not wear jewelry, make-up or nail polish.   Do not wear lotions, powders, or perfumes. You may wear deodorant.   Do not shave 48 hours prior to surgery.   Do not bring valuables to the hospital.   Contacts, dentures or bridgework may not be worn into surgery.   Leave suitcase in the car. After surgery it may be brought to your room.  For patients admitted to the hospital, checkout time is 11:00 AM the day of discharge.   Patients discharged the day of surgery will not be allowed to drive home.  Name and phone number of your driver: Terald Sleeper                                                      Special Instructions: CHG Shower Use Special Wash: 1/2 bottle night before surgery and 1/2 bottle morning of surgery.   Please read over the following fact sheets that you were given: Pain Booklet, MRSA Information and Surgical Site Infection Prevention

## 2011-05-15 MED ORDER — CLINDAMYCIN PHOSPHATE 900 MG/50ML IV SOLN
900.0000 mg | Freq: Once | INTRAVENOUS | Status: DC
  Filled 2011-05-15: qty 50

## 2011-05-16 ENCOUNTER — Ambulatory Visit (HOSPITAL_COMMUNITY): Payer: Medicare Other | Admitting: Critical Care Medicine

## 2011-05-16 ENCOUNTER — Other Ambulatory Visit: Payer: Self-pay | Admitting: Otolaryngology

## 2011-05-16 ENCOUNTER — Encounter (HOSPITAL_COMMUNITY): Payer: Self-pay | Admitting: Critical Care Medicine

## 2011-05-16 ENCOUNTER — Encounter (HOSPITAL_COMMUNITY): Payer: Self-pay | Admitting: *Deleted

## 2011-05-16 ENCOUNTER — Inpatient Hospital Stay (HOSPITAL_COMMUNITY)
Admission: RE | Admit: 2011-05-16 | Discharge: 2011-05-20 | DRG: 827 | Disposition: A | Discharge status: Home / Self Care | Payer: Medicare Other | Source: Ambulatory Visit | Attending: Otolaryngology | Admitting: Otolaryngology

## 2011-05-16 ENCOUNTER — Ambulatory Visit (HOSPITAL_COMMUNITY): Payer: Medicare Other

## 2011-05-16 ENCOUNTER — Other Ambulatory Visit: Payer: Self-pay

## 2011-05-16 ENCOUNTER — Encounter (HOSPITAL_COMMUNITY)
Admission: RE | Disposition: A | Discharge status: Home / Self Care | Payer: Self-pay | Source: Ambulatory Visit | Attending: Otolaryngology

## 2011-05-16 DIAGNOSIS — E785 Hyperlipidemia, unspecified: Secondary | ICD-10-CM

## 2011-05-16 DIAGNOSIS — M1990 Unspecified osteoarthritis, unspecified site: Secondary | ICD-10-CM

## 2011-05-16 DIAGNOSIS — F17200 Nicotine dependence, unspecified, uncomplicated: Secondary | ICD-10-CM | POA: Diagnosis present

## 2011-05-16 DIAGNOSIS — Z923 Personal history of irradiation: Secondary | ICD-10-CM

## 2011-05-16 DIAGNOSIS — Z882 Allergy status to sulfonamides status: Secondary | ICD-10-CM

## 2011-05-16 DIAGNOSIS — Z853 Personal history of malignant neoplasm of breast: Secondary | ICD-10-CM

## 2011-05-16 DIAGNOSIS — R634 Abnormal weight loss: Secondary | ICD-10-CM

## 2011-05-16 DIAGNOSIS — C50919 Malignant neoplasm of unspecified site of unspecified female breast: Principal | ICD-10-CM | POA: Diagnosis present

## 2011-05-16 DIAGNOSIS — Z01812 Encounter for preprocedural laboratory examination: Secondary | ICD-10-CM

## 2011-05-16 DIAGNOSIS — C779 Secondary and unspecified malignant neoplasm of lymph node, unspecified: Principal | ICD-10-CM | POA: Diagnosis present

## 2011-05-16 DIAGNOSIS — C140 Malignant neoplasm of pharynx, unspecified: Secondary | ICD-10-CM

## 2011-05-16 DIAGNOSIS — Z79899 Other long term (current) drug therapy: Secondary | ICD-10-CM

## 2011-05-16 DIAGNOSIS — Z91120 Patient's intentional underdosing of medication regimen due to financial hardship: Secondary | ICD-10-CM

## 2011-05-16 DIAGNOSIS — I10 Essential (primary) hypertension: Secondary | ICD-10-CM | POA: Insufficient documentation

## 2011-05-16 DIAGNOSIS — E871 Hypo-osmolality and hyponatremia: Secondary | ICD-10-CM | POA: Diagnosis not present

## 2011-05-16 DIAGNOSIS — Z9221 Personal history of antineoplastic chemotherapy: Secondary | ICD-10-CM

## 2011-05-16 DIAGNOSIS — R221 Localized swelling, mass and lump, neck: Secondary | ICD-10-CM

## 2011-05-16 HISTORY — PX: PANENDOSCOPY: SHX2159

## 2011-05-16 HISTORY — PX: RADICAL NECK DISSECTION: SHX2284

## 2011-05-16 SURGERY — LARYNGOSCOPY, WITH BRONCHOSCOPY AND ESOPHAGOSCOPY
Anesthesia: General | Site: Neck | Wound class: Clean Contaminated

## 2011-05-16 MED ORDER — ONDANSETRON HCL 4 MG/2ML IJ SOLN
INTRAMUSCULAR | Status: DC | PRN
  Administered 2011-05-16: 4 mg via INTRAVENOUS

## 2011-05-16 MED ORDER — HYDROMORPHONE HCL PF 1 MG/ML IJ SOLN: 0.2500 mg | INTRAMUSCULAR | Status: DC | PRN

## 2011-05-16 MED ORDER — LETROZOLE 2.5 MG PO TABS
2.5000 mg | ORAL_TABLET | Freq: Every day | ORAL | Status: DC
  Administered 2011-05-17 – 2011-05-19 (×3): 2.5 mg via ORAL
  Filled 2011-05-16 (×5): qty 1

## 2011-05-16 MED ORDER — CLINDAMYCIN PHOSPHATE 600 MG/50ML IV SOLN
INTRAVENOUS | Status: DC | PRN
  Administered 2011-05-16: 900 mg via INTRAVENOUS

## 2011-05-16 MED ORDER — BACITRACIN ZINC 500 UNIT/GM EX OINT
1.0000 "application " | TOPICAL_OINTMENT | Freq: Three times a day (TID) | CUTANEOUS | Status: DC
  Administered 2011-05-16 – 2011-05-20 (×12): 1 via TOPICAL
  Filled 2011-05-16: qty 15

## 2011-05-16 MED ORDER — PROMETHAZINE HCL 25 MG/ML IJ SOLN: 6.2500 mg | INTRAMUSCULAR | Status: DC | PRN

## 2011-05-16 MED ORDER — MEPERIDINE HCL 25 MG/ML IJ SOLN: 6.2500 mg | INTRAMUSCULAR | Status: DC | PRN

## 2011-05-16 MED ORDER — CYCLOBENZAPRINE HCL 10 MG PO TABS: 10.0000 mg | ORAL_TABLET | Freq: Every evening | ORAL | Status: DC | PRN

## 2011-05-16 MED ORDER — LORAZEPAM 1 MG PO TABS
1.0000 mg | ORAL_TABLET | Freq: Three times a day (TID) | ORAL | Status: DC | PRN
  Administered 2011-05-17 – 2011-05-19 (×3): 1 mg via ORAL
  Filled 2011-05-16 (×4): qty 1

## 2011-05-16 MED ORDER — FENTANYL CITRATE 0.05 MG/ML IJ SOLN
INTRAMUSCULAR | Status: DC | PRN
  Administered 2011-05-16 (×6): 50 ug via INTRAVENOUS
  Administered 2011-05-16 (×2): 100 ug via INTRAVENOUS

## 2011-05-16 MED ORDER — MIDAZOLAM HCL 5 MG/5ML IJ SOLN
INTRAMUSCULAR | Status: DC | PRN
  Administered 2011-05-16: 2 mg via INTRAVENOUS

## 2011-05-16 MED ORDER — LACTATED RINGERS IV SOLN
1000.0000 mL | INTRAVENOUS | Status: DC
  Administered 2011-05-16 (×3): via INTRAVENOUS

## 2011-05-16 MED ORDER — AMLODIPINE BESYLATE 5 MG PO TABS
5.0000 mg | ORAL_TABLET | Freq: Every day | ORAL | Status: DC
  Filled 2011-05-16 (×5): qty 1

## 2011-05-16 MED ORDER — MORPHINE SULFATE 2 MG/ML IJ SOLN: 0.0500 mg/kg | INTRAMUSCULAR | Status: DC | PRN

## 2011-05-16 MED ORDER — FLUOCINONIDE 0.05 % EX CREA
TOPICAL_CREAM | Freq: Two times a day (BID) | CUTANEOUS | Status: DC
  Administered 2011-05-16 – 2011-05-17 (×3): via TOPICAL
  Administered 2011-05-19: 1 via TOPICAL
  Filled 2011-05-16: qty 30

## 2011-05-16 MED ORDER — HYDROCODONE-ACETAMINOPHEN 5-325 MG PO TABS
1.0000 | ORAL_TABLET | ORAL | Status: DC | PRN
  Administered 2011-05-17 – 2011-05-20 (×16): 2 via ORAL
  Filled 2011-05-16: qty 2
  Filled 2011-05-16: qty 1
  Filled 2011-05-16 (×6): qty 2
  Filled 2011-05-16: qty 1
  Filled 2011-05-16 (×9): qty 2

## 2011-05-16 MED ORDER — MIDAZOLAM HCL 2 MG/2ML IJ SOLN: 0.5000 mg | Freq: Once | INTRAMUSCULAR | Status: DC | PRN

## 2011-05-16 MED ORDER — DEXAMETHASONE SODIUM PHOSPHATE 4 MG/ML IJ SOLN
INTRAMUSCULAR | Status: DC | PRN
  Administered 2011-05-16: 4 mg via INTRAVENOUS

## 2011-05-16 MED ORDER — EPHEDRINE SULFATE 50 MG/ML IJ SOLN
INTRAMUSCULAR | Status: DC | PRN
  Administered 2011-05-16: 10 mg via INTRAVENOUS
  Administered 2011-05-16: 5 mg via INTRAVENOUS

## 2011-05-16 MED ORDER — SODIUM CHLORIDE 0.9 % IV SOLN
INTRAVENOUS | Status: DC
  Administered 2011-05-16 – 2011-05-17 (×3): via INTRAVENOUS

## 2011-05-16 MED ORDER — HETASTARCH-ELECTROLYTES 6 % IV SOLN
INTRAVENOUS | Status: DC | PRN
  Administered 2011-05-16: 12:00:00 via INTRAVENOUS

## 2011-05-16 MED ORDER — ONDANSETRON HCL 4 MG/2ML IJ SOLN: 4.0000 mg | INTRAMUSCULAR | Status: DC | PRN

## 2011-05-16 MED ORDER — SURGILUBE EX GEL
CUTANEOUS | Status: DC | PRN
  Administered 2011-05-16: 1 via TOPICAL

## 2011-05-16 MED ORDER — PROPOFOL 10 MG/ML IV EMUL
INTRAVENOUS | Status: DC | PRN
  Administered 2011-05-16: 200 mg via INTRAVENOUS

## 2011-05-16 MED ORDER — BACITRACIN ZINC 500 UNIT/GM EX OINT
TOPICAL_OINTMENT | CUTANEOUS | Status: DC | PRN
  Administered 2011-05-16: 1 via TOPICAL

## 2011-05-16 MED ORDER — ONDANSETRON HCL 4 MG PO TABS: 4.0000 mg | ORAL_TABLET | Freq: Three times a day (TID) | ORAL | Status: DC | PRN

## 2011-05-16 MED ORDER — MORPHINE SULFATE 4 MG/ML IJ SOLN
2.0000 mg | INTRAMUSCULAR | Status: DC | PRN
  Administered 2011-05-16: 2 mg via INTRAVENOUS
  Administered 2011-05-16 (×3): 4 mg via INTRAVENOUS
  Administered 2011-05-17 – 2011-05-19 (×5): 2 mg via INTRAVENOUS
  Filled 2011-05-16 (×9): qty 1

## 2011-05-16 MED ORDER — SENNOSIDES-DOCUSATE SODIUM 8.6-50 MG PO TABS
1.0000 | ORAL_TABLET | Freq: Every evening | ORAL | Status: DC | PRN
  Administered 2011-05-18 – 2011-05-19 (×2): 1 via ORAL
  Filled 2011-05-16 (×2): qty 1

## 2011-05-16 MED ORDER — SODIUM CHLORIDE 0.9 % IR SOLN
Status: DC | PRN
  Administered 2011-05-16: 1000 mL

## 2011-05-16 MED ORDER — MORPHINE SULFATE 4 MG/ML IJ SOLN
INTRAMUSCULAR | Status: AC
  Administered 2011-05-16: 2 mg via INTRAVENOUS
  Filled 2011-05-16: qty 1

## 2011-05-16 MED ORDER — MORPHINE SULFATE 2 MG/ML IJ SOLN
0.0500 mg/kg | INTRAMUSCULAR | Status: AC | PRN
  Administered 2011-05-16 (×4): 2 mg via INTRAVENOUS

## 2011-05-16 MED ORDER — PHENYLEPHRINE HCL 10 MG/ML IJ SOLN
INTRAMUSCULAR | Status: DC | PRN
  Administered 2011-05-16 (×3): 40 ug via INTRAVENOUS

## 2011-05-16 MED ORDER — IBUPROFEN 400 MG PO TABS
400.0000 mg | ORAL_TABLET | Freq: Four times a day (QID) | ORAL | Status: DC | PRN
  Administered 2011-05-19: 400 mg via ORAL
  Filled 2011-05-16: qty 1

## 2011-05-16 MED ORDER — SUCCINYLCHOLINE CHLORIDE 20 MG/ML IJ SOLN
INTRAMUSCULAR | Status: DC | PRN
  Administered 2011-05-16: 100 mg via INTRAVENOUS

## 2011-05-16 MED ORDER — ONDANSETRON HCL 4 MG PO TABS
4.0000 mg | ORAL_TABLET | ORAL | Status: DC | PRN
  Administered 2011-05-17: 4 mg via ORAL
  Filled 2011-05-16: qty 1

## 2011-05-16 MED ORDER — DIPHENHYDRAMINE HCL 25 MG PO CAPS: 25.0000 mg | ORAL_CAPSULE | Freq: Four times a day (QID) | ORAL | Status: DC | PRN

## 2011-05-16 MED ORDER — PROPOFOL 10 MG/ML IV EMUL
INTRAVENOUS | Status: DC | PRN
  Administered 2011-05-16: 50 ug/kg/min via INTRAVENOUS

## 2011-05-16 SURGICAL SUPPLY — 61 items
APPLIER CLIP 9.375 SM OPEN (CLIP)
APR CLP SM 9.3 20 MLT OPN (CLIP)
ATTRACTOMAT 16X20 MAGNETIC DRP (DRAPES) IMPLANT
BLADE SURG 10 STRL SS (BLADE) ×1 IMPLANT
BLADE SURG 15 STRL LF DISP TIS (BLADE) ×1 IMPLANT
BLADE SURG 15 STRL SS (BLADE) ×3
CANISTER SUCTION 2500CC (MISCELLANEOUS) ×3 IMPLANT
CLEANER TIP ELECTROSURG 2X2 (MISCELLANEOUS) ×5 IMPLANT
CLIP APPLIE 9.375 SM OPEN (CLIP) ×2 IMPLANT
CLOTH BEACON ORANGE TIMEOUT ST (SAFETY) ×3 IMPLANT
CONT SPEC 4OZ CLIKSEAL STRL BL (MISCELLANEOUS) ×4 IMPLANT
COVER SURGICAL LIGHT HANDLE (MISCELLANEOUS) ×3 IMPLANT
COVER TABLE BACK 60X90 (DRAPES) ×3 IMPLANT
DRAIN CHANNEL 10F 3/8 F FF (DRAIN) ×1 IMPLANT
DRAIN CHANNEL 15F RND FF W/TCR (WOUND CARE) ×1 IMPLANT
DRAIN PENROSE 1/2X12 LTX STRL (WOUND CARE) IMPLANT
DRAPE PROXIMA HALF (DRAPES) ×3 IMPLANT
ELECT COATED BLADE 2.86 ST (ELECTRODE) ×3 IMPLANT
ELECT REM PT RETURN 9FT ADLT (ELECTROSURGICAL) ×3
ELECTRODE REM PT RTRN 9FT ADLT (ELECTROSURGICAL) ×2 IMPLANT
EVACUATOR SILICONE 100CC (DRAIN) ×3 IMPLANT
GAUZE SPONGE 4X4 16PLY XRAY LF (GAUZE/BANDAGES/DRESSINGS) ×7 IMPLANT
GLOVE BIO SURGEON STRL SZ 6.5 (GLOVE) ×4 IMPLANT
GLOVE BIOGEL PI IND STRL 6.5 (GLOVE) IMPLANT
GLOVE BIOGEL PI INDICATOR 6.5 (GLOVE) ×2
GLOVE ECLIPSE 8.0 STRL XLNG CF (GLOVE) ×4 IMPLANT
GLOVE SURG SS PI 6.5 STRL IVOR (GLOVE) ×8 IMPLANT
GLOVE SURG SS PI 7.0 STRL IVOR (GLOVE) ×2 IMPLANT
GOWN STRL NON-REIN LRG LVL3 (GOWN DISPOSABLE) ×14 IMPLANT
GOWN W/COTTON TOWEL STD LRG (GOWNS) ×2 IMPLANT
GUARD TEETH (MISCELLANEOUS) ×3 IMPLANT
KIT BASIN OR (CUSTOM PROCEDURE TRAY) ×3 IMPLANT
KIT ROOM TURNOVER OR (KITS) ×3 IMPLANT
LOCATOR NERVE 3 VOLT (DISPOSABLE) IMPLANT
MARKER SKIN DUAL TIP RULER LAB (MISCELLANEOUS) IMPLANT
NS IRRIG 1000ML POUR BTL (IV SOLUTION) ×3 IMPLANT
PAD ARMBOARD 7.5X6 YLW CONV (MISCELLANEOUS) ×3 IMPLANT
PATTIES SURGICAL 1X1 (DISPOSABLE) IMPLANT
PENCIL BUTTON HOLSTER BLD 10FT (ELECTRODE) ×3 IMPLANT
SPECIMEN JAR MEDIUM (MISCELLANEOUS) ×2 IMPLANT
SPECIMEN JAR SMALL (MISCELLANEOUS) ×1 IMPLANT
SPONGE GAUZE 4X4 12PLY (GAUZE/BANDAGES/DRESSINGS) ×1 IMPLANT
SPONGE INTESTINAL PEANUT (DISPOSABLE) IMPLANT
SPONGE LAP 18X18 X RAY DECT (DISPOSABLE) ×3 IMPLANT
STAPLER VISISTAT 35W (STAPLE) ×3 IMPLANT
SURGILUBE 2OZ TUBE FLIPTOP (MISCELLANEOUS) ×3 IMPLANT
SUT CHROMIC 4 0 PS 2 18 (SUTURE) ×7 IMPLANT
SUT ETHILON 3 0 PS 1 (SUTURE) ×4 IMPLANT
SUT ETHILON 5 0 PS 2 18 (SUTURE) ×1 IMPLANT
SUT SILK 2 0 REEL (SUTURE) ×2 IMPLANT
SUT SILK 2 0 SH CR/8 (SUTURE) ×3 IMPLANT
SUT SILK 3 0 REEL (SUTURE) ×4 IMPLANT
SUT SILK 4 0 REEL (SUTURE) ×2 IMPLANT
TOWEL OR 17X24 6PK STRL BLUE (TOWEL DISPOSABLE) ×8 IMPLANT
TOWEL OR 17X26 10 PK STRL BLUE (TOWEL DISPOSABLE) ×3 IMPLANT
TRAP SPECIMEN MUCOUS 40CC (MISCELLANEOUS) IMPLANT
TRAY ENT MC OR (CUSTOM PROCEDURE TRAY) ×3 IMPLANT
TRAY FOLEY CATH 14FRSI W/METER (CATHETERS) IMPLANT
TUBE CONNECTING 12X1/4 (SUCTIONS) ×3 IMPLANT
TUBE FEEDING 10FR FLEXIFLO (MISCELLANEOUS) IMPLANT
WATER STERILE IRR 1000ML POUR (IV SOLUTION) ×3 IMPLANT

## 2011-05-16 NOTE — Anesthesia Procedure Notes (Addendum)
Procedure Name: Intubation Date/Time: 05/16/2011 9:18 AM Performed by: Elon Alas Pre-anesthesia Checklist: Patient identified, Emergency Drugs available, Suction available, Patient being monitored and Timeout performed Patient Re-evaluated:Patient Re-evaluated prior to inductionOxygen Delivery Method: Circle System Utilized Preoxygenation: Pre-oxygenation with 100% oxygen Intubation Type: IV induction Ventilation: Oral airway inserted - appropriate to patient size Grade View: Grade III Airway Equipment and Method: video-laryngoscopy Placement Confirmation: ETT inserted through vocal cords under direct vision,  positive ETCO2 and breath sounds checked- equal and bilateral Secured at: 22 cm Tube secured with: Tape Dental Injury: Bloody posterior oropharynx  Comments: Pt. S/p radiation.  Anatomy slightly altered.

## 2011-05-16 NOTE — Interval H&P Note (Signed)
History and Physical Interval Note:   05/16/2011   8:50 AM   Taylor [name]  has presented today for surgery, with the diagnosis of METASTATIC LYMPHADENOPATHY LEFT NECK  The various methods of treatment have been discussed with the patient and family. After consideration of risks, benefits and other options for treatment, the patient has consented to  Procedure(s): PANENDOSCOPY RADICAL NECK DISSECTION as a surgical intervention .  The patients' history has been reviewed, patient re-examined, no change in status, stable for surgery.  I have reviewed the patient's chart and labs.  Questions were answered to the patient's satisfaction.     Cephus Richer  MD

## 2011-05-16 NOTE — Anesthesia Preprocedure Evaluation (Addendum)
Anesthesia Evaluation  Patient identified by MRN, date of birth, ID band Patient awake    Reviewed: Allergy & Precautions, H&P , NPO status , Patient's Chart, lab work & pertinent test results  Airway Mallampati: II TM Distance: >3 FB Neck ROM: Full    Dental  (+) Teeth Intact and Dental Advisory Given   Pulmonary former smoker         Cardiovascular hypertension, Pt. on medications     Neuro/Psych  Headaches,    GI/Hepatic Neg liver ROS,   Endo/Other  Negative Endocrine ROS  Renal/GU negative Renal ROS  Genitourinary negative   Musculoskeletal   Abdominal   Peds  Hematology negative hematology ROS (+)   Anesthesia Other Findings   Reproductive/Obstetrics                           Anesthesia Physical Anesthesia Plan  ASA: III  Anesthesia Plan: General   Post-op Pain Management:    Induction: Intravenous  Airway Management Planned: Oral ETT  Additional Equipment: Arterial line  Intra-op Plan:   Post-operative Plan: Possible Post-op intubation/ventilation  Informed Consent: I have reviewed the patients History and Physical, chart, labs and discussed the procedure including the risks, benefits and alternatives for the proposed anesthesia with the patient or authorized representative who has indicated his/her understanding and acceptance.   Dental advisory given  Plan Discussed with: Anesthesiologist and CRNA  Anesthesia Plan Comments:        Anesthesia Quick Evaluation

## 2011-05-16 NOTE — Op Note (Signed)
05/16/2011  1:40 PM    Alianys, Chacko  [id number]   Pre-Op Dx:  Left neck metastatic squamous cell carcinoma  Post-op Dx: Same  Proc: Direct laryngoscopy, esophagoscopy, modified radical left neck dissection.   Surg:  Cephus Richer MD  Assistants: Melvenia Beam M.D.  Anes:  GOT  EBL:  100 cc   Comp:  Sacrifice, left spinal accessory nerve.  Findings:  Edema and fibrosis of the hypopharynx and larynx with no sign of recurrent carcinoma.  Dense fibrosis of the left neck with fibrous replacement of the sternomastoid muscle, partial, and dense fibrosis around the spinal accessory nerve and upper great vessels sheath. Dense fullness without discrete mass at the site of known carcinoma. Probable preservation of the left ramus mandibularis nerve. Preservation of the left hypoglossal nerve.  Procedure: With the patient in a comfortable supine position, general orotracheal anesthesia was induced without difficulty. At an appropriate level, the table was turned 90 the patient was placed in a slight reverse Trendelenburg. A clean preparation and draping was accomplished. A rubber tooth guard was applied to the upper dentition. Taking care to protect lips, teeth, and endotracheal tube, the anterior commissure laryngoscope was introduced. The laser scope was too large and a smaller Hollinger laryngoscope was elected and this was introduced and full inspection of the endolarynx, base of tongue, exo-larynx and hypopharynx was performed. No lesions noted. The laryngoscope was removed.  The oral cavity, oropharynx, base of tongue were palpated.  The cervical esophagoscope was lubricated and passed gently through the cricopharyngeus and to its full length and no significant findings. The esophagoscope was removed. This completed the endoscopic portion of the procedure.  The table was turned back towards anesthesia, a shoulder roll was placed and the neck was extended and the head rotated to  the right for access to the left neck. The neck was palpated with the findings as described above. A previous wrinkle was elected as the incision site. A sterile preparation and draping of the entire left neck and upper chest was performed in the standard fashion.  A 12 cm transverse incision was marked and crosshatched and then sharply executed through skin and cutaneous tissue. This was fibrotic and had moderate oozing consistent with prior radiation. Fibrotic platysma muscle was identified and opened and subplatysmal planes were generated superiorly to the mastoid tip and a submandibular gland and inferiorly to the omohyoid region. External jugular vein was identified and controlled with 3-0 silk ligatures. Hemostasis was achieved with cautery and with ligatures.  Beginning at the posterior edge of the sternomastoid muscle, the fascia was elevated off the surface of the muscle including some of the muscle bulk and rolled forward. There was some activation of the trapezius muscle posteriorly but the nerve was not at this time identified. The dissection was carried from the mastoid tip and over towards the mandibular angle going through the tail of the parotid. the fascia over the surface of the submandibular gland was rolled downward taking care to leave ramus mandibularis nerve up. Upon identifying the omohyoid muscle, dissection was carried up along the strap muscles and communicated with the submandibular dissection.  Working under the superior sternocleidomastoid muscle, level IIB tissue was dissected from the medial surface of the muscle down to the muscles on the floor of the posterior triangle and brought forward. There was dense fibrosis throughout this entire region. The cervical plexus was difficult to identify. Upon wrapping around the anterior and medial surface of the sternocleidomastoid muscle, dissection was  carried down to the floor of the posterior triangle. The spinal accessory nerve was  not identified by visualization or by activation. The dissection was carried forward.  The jugular vein was identified inferiorly and the tissues from the posterior triangle were rolled up onto the jugular vein and then forward. this was done from inferiorly to superiorly. Tissue planes were relatively easy to identify inferiorly and were very densely fibrotic superiorly, but did dissect away from the jugular vein which was preserved intact. Rolling the specimen forward off the carotid artery, the hypoglossal nerve was identified superiorly and preserved. The ansa hypoglossi was sacrificed. The trunk of the spinal accessory nerve was identified overlying the superior jugular vein and was clamped and ligated.  finally, the specimen remain pedicled by small branches of the external carotid system which were controlled with ligature and divided. The specimen was marked for orientation and sent for pathologic interpretation. Small bleeding sites were controlled with cautery or with silk ligature. The wound was thoroughly irrigated. Valsalva did not demonstrate any active bleeding sites.  A 15 French round Hemovac drain was passed through the posterior inferior neck and laid into the wound. This was secured at the skin with a 3-0 Ethilon suture.  The wound was closed with interrupted 4-0 chromic suture at the platysma layer, and skin staples in a cosmetic fashion. Hemostasis was observed. The drains were functioning well. At this point the procedure was completed. The patient was returned to anesthesia, awakened, extubated, and transferred to recovery in stable condition.  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  Dispo:   PACU to 5100   Plan:  Ice, elevation, analgesia,  suction drainage. Will discontinue Foley catheter, advance diet and activity as soon as able.  Cephus Richer MD

## 2011-05-16 NOTE — Transfer of Care (Signed)
Immediate Anesthesia Transfer of Care Note  Patient: Taylor [name]  Procedure(s) Performed:  PANENDOSCOPY - Direct laryngoscopy, esophagoscopy; RADICAL NECK DISSECTION - Modified radical left neck dissection   Patient Location: PACU  Anesthesia Type: General  Level of Consciousness: awake, alert  and oriented  Airway & Oxygen Therapy: Patient Spontanous Breathing and Patient connected to nasal cannula oxygen  Post-op Assessment: Report given to PACU RN, Post -op Vital signs reviewed and stable, Patient moving all extremities X 4 and Tongue deviated - surgeon notified  Post vital signs: Reviewed and stable  Complications: No apparent anesthesia complications

## 2011-05-16 NOTE — Op Note (Signed)
05/16/2011  12:20 PM    Versie, Soave  [id number]   Pre-Op Dx:  Recurerent SCCa, LEFT level II neck  Post-op Dx: same  Proc: Direct Laryngoscopy, Esophagoscopy, LEFT modified radical neck dissection   Surg:  Cephus Richer MD  Assistant:  Melvenia Beam  MD  Anes:  GOT  EBL:  100 cc  Comp:  LEFT spinal accessory nerve sacrifice   Findings:  Radiation edema in pharynx and larynx.  No visible cancer lesions.  Heavy fibrosis of skin, sternocleidomastoid muscle, jugular sheath.   Spinal accessory n. Scarred into tumor mass.  Hypoglossal n, jugular vein, carotid artery identified and preserved.  Procedure: see dictated note  Dispo:   OR to PACU to 5100 or similar   Plan:  Suction drainage, analgesics, ice, elevation.  Physical therapy consult for LEFT shoulder.  Cephus Richer MD

## 2011-05-16 NOTE — Progress Notes (Signed)
Subjective: Minimal pain, lip is weak, no other complaints.  Objective: Vital signs in last 24 hours: Temp:  [97.6 F (36.4 C)-98.6 F (37 C)] 97.6 F (36.4 C) (11/14 1500) Pulse Rate:  [79-103] 88  (11/14 1515) Resp:  [7-21] 9  (11/14 1515) BP: (127-147)/(63-81) 135/63 mmHg (11/14 1503) SpO2:  [93 %-100 %] 99 % (11/14 1515) Arterial Line BP: (139-193)/(56-84) 139/56 mmHg (11/14 1421) Wt Readings from Last 1 Encounters:  05/15/11 49.6 kg (109 lb 5.6 oz)    Intake/Output from previous day:   Intake/Output this shift: Total I/O In: 2200 [I.V.:2200] Out: 1050 [Urine:885; Drains:15; Blood:150]  Incision loks great, no swelling or hematoma. JP functioning. Lower lip weakness present.Voice and breathing clear.  No results found for this basename: WBC:2,HGB:2,HCT:2,PLT:2 in the last 72 hours  No results found for this basename: NA:2,K:2,CL:2,CO2:2,GLUCOSE:2,BUN:2,CREATININE:2,CALCIUM:2 in the last 72 hours  Medications: I have reviewed the patient's current medications.  Assessment/Plan: Stable post op. Continue care.   LOS: 0 days   Adalee Kathan H 05/16/2011, 3:57 PM

## 2011-05-16 NOTE — Anesthesia Postprocedure Evaluation (Signed)
  Anesthesia Post-op Note  Patient: Taylor [name]  Procedure(s) Performed:  PANENDOSCOPY - Direct laryngoscopy, esophagoscopy; RADICAL NECK DISSECTION - Modified radical left neck dissection   Patient Location: PACU  Anesthesia Type: General  Level of Consciousness: awake  Airway and Oxygen Therapy: Patient Spontanous Breathing  Post-op Pain: mild  Post-op Assessment: Post-op Vital signs reviewed  Post-op Vital Signs: stable  Complications: No apparent anesthesia complications 

## 2011-05-16 NOTE — H&P (Signed)
H&P handwritten on chart.  Pt. Seen and re-examined.  Informed consent updated.  Site marked.

## 2011-05-16 NOTE — Preoperative (Signed)
Beta Blockers   Reason not to administer Beta Blockers:Not Applicable 

## 2011-05-16 NOTE — OR Nursing (Signed)
Procedure 1 panendoscopy: Start time 0931 End time 0938  Procedure 2  left neck dissection: Start time 1000 End time 1217

## 2011-05-17 LAB — BASIC METABOLIC PANEL
CO2: 26 mEq/L (ref 19–32)
Calcium: 8.6 mg/dL (ref 8.4–10.5)
Creatinine, Ser: 0.39 mg/dL — ABNORMAL LOW (ref 0.50–1.10)
GFR calc Af Amer: >90 mL/min (ref >90)
GFR calc non Af Amer: >90 mL/min (ref >90)
Sodium: 130 mEq/L — ABNORMAL LOW (ref 135–145)

## 2011-05-17 LAB — CBC
MCV: 93.2 fL (ref 78.0–100.0)
Platelets: 280 10*3/uL (ref 150–400)
RBC: 2.81 MIL/uL — ABNORMAL LOW (ref 3.87–5.11)
RDW: 12.3 % (ref 11.5–15.5)
WBC: 8.7 10*3/uL (ref 4.0–10.5)

## 2011-05-17 LAB — PREALBUMIN: Prealbumin: 20.6 mg/dL (ref 17.0–34.0)

## 2011-05-17 NOTE — Progress Notes (Signed)
Physical Therapy Evaluation Patient Details Name: Taylor [name] MRN: [id number] DOB: [birth year] Today's Date: 05/17/2011  Problem List:  Patient Active Problem List  Diagnoses  . HYPERLIPIDEMIA  . ANXIETY STATE NEC  . TOBACCO USE  . HYPERTENSION  . HEADACHE  . BREAST CANCER, HX OF  . Weight loss  . Throat cancer  . Arthritis  . Dysphagia    Past Medical History:  Past Medical History  Diagnosis Date  . Hyperlipidemia     STATES SHE NO LONGER HAS HI CHOLESTEROL  . Hypertension   . Headache   . Rhinitis   . Throat cancer 2010  . Breast ca 2008   Past Surgical History:  Past Surgical History  Procedure Date  . Breast lumpectomy   . Appendectomy     PT Assessment/Plan/Recommendation PT Assessment Clinical Impression Statement: Pt with limited shoulder and neck function secondary to surgery who will benefit from strengthening acutely with OPPT followup. Daily ambulation recommended.  PT Recommendation/Assessment: Patient will need skilled PT in the acute care venue PT Problem List: Decreased strength;Decreased range of motion Barriers to Discharge: None PT Therapy Diagnosis : Acute pain PT Plan PT Frequency: Min 3X/week PT Treatment/Interventions: Therapeutic exercise;Patient/family education PT Recommendation Recommendations for Other Services: OT consult Follow Up Recommendations: Outpatient PT Equipment Recommended: None recommended by PT PT Goals  Acute Rehab PT Goals PT Goal Formulation: With patient Time For Goal Achievement: 7 days Pt will go Supine/Side to Sit: Independently;with HOB 0 degrees PT Goal: Supine/Side to Sit - Progress: Other (comment) Pt will Ambulate: >150 feet;Independently PT Goal: Ambulate - Progress: Other (comment) Pt will Perform Home Exercise Program: Independently PT Goal: Perform Home Exercise Program - Progress: Other (comment)  PT Evaluation Precautions/Restrictions    Prior Functioning  Home Living Lives With:  Friend(s) Type of Home: Apartment Home Layout: One level Home Access: Stairs to enter Entrance Stairs-Rails: Left Entrance Stairs-Number of Steps: 15 x 2 , 2 flights to enter no Engineer, maintenance (IT) Shower/Tub: Engineer, manufacturing systems: Standard Home Adaptive Equipment: None Prior Function Level of Independence: Independent with basic ADLs;Independent with transfers;Independent with homemaking with ambulation;Independent with gait Driving: Yes Vocation: Retired Producer, television/film/video: Awake/alert Overall Cognitive Status: Appears within functional limits for tasks assessed Orientation Level: Oriented X4 Sensation/Coordination   Extremity Assessment RUE Assessment RUE Assessment: Within Functional Limits LUE Assessment LUE Assessment: Exceptions to WFL LUE AROM (degrees) Overall AROM Left Upper Extremity: Deficits (Pt with limited shoulder flexion and Abdct grossly 2+/5) Mobility (including Balance) Bed Mobility Bed Mobility: No Transfers Transfers: Yes Sit to Stand: 6: Modified independent (Device/Increase time);From toilet;From chair/3-in-1 Stand to Sit: 6: Modified independent (Device/Increase time);To chair/3-in-1;To toilet Ambulation/Gait Ambulation/Gait: Yes Ambulation/Gait Assistance: 6: Modified independent (Device/Increase time) Ambulation Distance (Feet): 300 Feet Assistive device: None Gait Pattern:  (pt with initial sway and slight scissoring on intial turn) Stairs: Yes Stairs Assistance: 6: Modified independent (Device/Increase time) Stair Management Technique: One rail Left Number of Stairs: 40  Height of Stairs: 8   Posture/Postural Control Posture/Postural Control: No significant limitations Exercise  General Exercises - Upper Extremity Shoulder Flexion: AROM;Left;5 reps;Seated Shoulder ABduction: AROM;Left;5 reps;Seated Other Exercises Other Exercises: neck rotation x 3 Bil AROM Other Exercises: shoulder shrugs x 3 AROM on  Left End of Session PT - End of Session Activity Tolerance: Patient tolerated treatment well;Patient limited by pain Patient left: in chair;with call bell in reach Nurse Communication: Mobility status for ambulation General Behavior During Session: [facility] for tasks performed Cognition: [facility] for tasks performed  Toney Sang Beth 05/17/2011, 4:15 PM  Toney Sang, PT [phone]

## 2011-05-17 NOTE — Anesthesia Postprocedure Evaluation (Signed)
  Anesthesia Post-op Note  Patient: Taylor [name]  Procedure(s) Performed:  PANENDOSCOPY - Direct laryngoscopy, esophagoscopy; RADICAL NECK DISSECTION - Modified radical left neck dissection   Patient Location: PACU  Anesthesia Type: General  Level of Consciousness: awake  Airway and Oxygen Therapy: Patient Spontanous Breathing  Post-op Pain: mild  Post-op Assessment: Post-op Vital signs reviewed  Post-op Vital Signs: stable  Complications: No apparent anesthesia complications

## 2011-05-17 NOTE — Progress Notes (Signed)
  05/17/2011 1:04 PM  Taylor [name] [id number]  Post-Op Day 1    Temp:  [97.6 F (36.4 C)-98.5 F (36.9 C)] 98.4 F (36.9 C) (11/15 0948) Pulse Rate:  [78-102] 78  (11/15 0948) Resp:  [7-21] 20  (11/15 0948) BP: (116-145)/(52-77) 123/65 mmHg (11/15 0948) SpO2:  [93 %-100 %] 100 % (11/15 0948) Arterial Line BP: (139-193)/(56-82) 139/56 mmHg (11/14 1421),     Intake/Output Summary (Last 24 hours) at 05/17/11 1304 Last data filed at 05/17/11 0900  Gross per 24 hour  Intake   1720 ml  Output   2535 ml  Net   -815 ml    Hemovac: 35 cc  Results for orders placed during the hospital encounter of 05/16/11 (from the past 24 hour(s))  BASIC METABOLIC PANEL     Status: Abnormal   Collection Time   05/17/11 10:08 AM      Component Value Range   Sodium 130 (*) 135 - 145 (mEq/L)   Potassium 3.8  3.5 - 5.1 (mEq/L)   Chloride 96  96 - 112 (mEq/L)   CO2 26  19 - 32 (mEq/L)   Glucose, Bld 104 (*) 70 - 99 (mg/dL)   BUN 6  6 - 23 (mg/dL)   Creatinine, Ser 4.78 (*) 0.50 - 1.10 (mg/dL)   Calcium 8.6  8.4 - 29.5 (mg/dL)   GFR calc non Af Amer >90  >90 (mL/min)   GFR calc Af Amer >90  >90 (mL/min)  CBC     Status: Abnormal   Collection Time   05/17/11 10:08 AM      Component Value Range   WBC 8.7  4.0 - 10.5 (K/uL)   RBC 2.81 (*) 3.87 - 5.11 (MIL/uL)   Hemoglobin 9.1 (*) 12.0 - 15.0 (g/dL)   HCT 62.1 (*) 30.8 - 46.0 (%)   MCV 93.2  78.0 - 100.0 (fL)   MCH 32.4  26.0 - 34.0 (pg)   MCHC 34.7  30.0 - 36.0 (g/dL)   RDW 65.7  84.6 - 96.2 (%)   Platelets 280  150 - 400 (K/uL)    SUBJECTIVE:  Dislikes Foley catheter.  Pain better controlled with Vicodin than Morphine.  No SOB.  Eating easily.  OBJECTIVE:  Mild medial wound edge oozing.   Flaps flat, drains functional.  Weak LEFT r. Mandibularis. Strong LEFT shoulder despite Spinal accessory nerve sacrifice.  Voice clear.  Breathing unlabored.  IMPRESSION:  Satisfactory check.    PLAN:  Advance diet and activity.  D/C Foley.   Continue wall suction to drain.    Cephus Richer

## 2011-05-17 NOTE — Clinical Documentation Improvement (Signed)
GENERIC DOCUMENTATION CLARIFICATION QUERY  Please update your documentation within the medical record to reflect your response to this query.                                                                                         05/17/11    Dear Taylor [name] and  Associates,  In a better effort to capture your patient's severity of illness, reflect appropriate length of stay and utilization of resources, a review of the patient medical record has revealed the following indicators.    Based on your clinical judgment, please clarify and document in a progress note and/or discharge summary the clinical condition associated with the following supporting information:  In responding to this query please exercise your independent judgment.  The fact that a query is asked, does not imply that any particular answer is desired or expected.  You may use possible, probable, or suspect with inpatient documentation. possible, probable, suspected diagnoses MUST be documented at the time of discharge  Possible Clinical Conditions?  _______Hyponatremia _______Other Condition__________________ _______Cannot Clinically Determine    Supporting Information:  Diagnostics:  Sodium level 130 on 11-15  Treatment:     NS @100cc /hr (D/c'd 11-5)          Info documented in D/C summary  Thank You,  Sincerely, Vivia Budge RN, BSN Clinical Documentation Specialist:  Pager: [phone] Health Information Management [place]

## 2011-05-18 MED ORDER — OXYCODONE-ACETAMINOPHEN 5-325 MG PO TABS: 1.0000 | ORAL_TABLET | Freq: Four times a day (QID) | ORAL | Status: AC | PRN

## 2011-05-18 MED ORDER — HYDROCODONE-ACETAMINOPHEN 5-325 MG PO TABS: 1.0000 | ORAL_TABLET | ORAL | Status: AC | PRN

## 2011-05-18 MED ORDER — BACITRACIN ZINC 500 UNIT/GM EX OINT: 1.0000 "application " | TOPICAL_OINTMENT | Freq: Three times a day (TID) | CUTANEOUS | Status: AC

## 2011-05-18 MED ORDER — OXYCODONE-ACETAMINOPHEN 5-325 MG PO TABS
1.0000 | ORAL_TABLET | Freq: Four times a day (QID) | ORAL | Status: DC | PRN
  Administered 2011-05-19: 2 via ORAL
  Filled 2011-05-18: qty 2

## 2011-05-18 MED ORDER — OXYCODONE-ACETAMINOPHEN 5-325 MG PO TABS: 1.0000 | ORAL_TABLET | Freq: Four times a day (QID) | ORAL | Status: DC | PRN

## 2011-05-18 NOTE — Progress Notes (Signed)
Physical Therapy Treatment Patient Details Name: BRAYLEN DENUNZIO MRN: [id number] DOB: [birth year] Today's Date: 05/18/2011  PT Assessment/Plan  PT - Assessment/Plan Comments on Treatment Session: Pt presents with limitations in strength and ROM of LUE and neck. Pt would benefit from continued PT to focus on these areas. Pt is safe to ambulate without assistance in hall. Pt has met most mobility goals except for steps. PT to continue to focus on ROM/strengthening of shoulder and neck.  PT Plan: Discharge plan remains appropriate PT Goals  Acute Rehab PT Goals PT Goal: Supine/Side to Sit - Progress: Met PT Goal: Ambulate - Progress: Met PT Goal: Perform Home Exercise Program - Progress: Progressing toward goal  PT Treatment Precautions/Restrictions  Precautions Precautions: Other (comment) (drain in left upper shoulder/neck) Mobility (including Balance) Bed Mobility Bed Mobility: Yes Supine to Sit: 7: Independent Transfers Transfers: Yes Sit to Stand: 7: Independent Stand to Sit: 7: Independent Ambulation/Gait Ambulation/Gait: Yes Ambulation/Gait Assistance: 7: Independent Ambulation Distance (Feet): 200 Feet Assistive device: None Gait Pattern: Within Functional Limits Stairs: Yes Stairs Assistance: 6: Modified independent (Device/Increase time) Stair Management Technique: One rail Left Number of Stairs: 20   Posture/Postural Control Posture/Postural Control: No significant limitations Balance Balance Assessed: Yes Dynamic Standing Balance Dynamic Standing - Balance Support: No upper extremity supported Dynamic Standing - Level of Assistance: 6: Modified independent (Device/Increase time) Exercise  General Exercises - Upper Extremity Shoulder Flexion: AROM;Left;5 reps;Seated Shoulder ABduction: AROM;Left;5 reps;Seated Shoulder Exercises Neck Flexion: AROM;Strengthening;5 reps;Seated Neck Extension: AROM;Strengthening;5 reps;Seated Neck Lateral Flexion - Left:  AROM;Strengthening;5 reps;Seated Other Exercises Other Exercises: neck rotation  AROM  five reps in a small range secondary to pain End of Session PT - End of Session Activity Tolerance: Patient tolerated treatment well Patient left: in bed Nurse Communication: Mobility status for ambulation;Other (comment) (ok for pt to get up in room by self if drain out) General Behavior During Session: [facility] for tasks performed Cognition: [facility] for tasks performed  Greggory Stallion 05/18/2011, 10:18 AM

## 2011-05-18 NOTE — Progress Notes (Signed)
Received order for HHPT, met with pt and friend , list of [facility] providers given and pt selected [facility] for HHPT. Referral made to [facility].  CRoyal RN MPH

## 2011-05-18 NOTE — Progress Notes (Signed)
  05/18/2011 9:01 AM  Taylor [name] [id number]  Post-Op Day 2    Temp:  [98.4 F (36.9 C)-98.7 F (37.1 C)] 98.4 F (36.9 C) (11/16 0656) Pulse Rate:  [77-81] 81  (11/16 0656) Resp:  [18-20] 18  (11/16 0656) BP: (110-159)/(54-72) 110/54 mmHg (11/16 0656) SpO2:  [94 %-100 %] 95 % (11/16 0656),     Intake/Output Summary (Last 24 hours) at 05/18/11 0901 Last data filed at 05/18/11 0657  Gross per 24 hour  Intake    480 ml  Output   3750 ml  Net  -3270 ml   Hemovac:  100 ml  Results for orders placed during the hospital encounter of 05/16/11 (from the past 24 hour(s))  BASIC METABOLIC PANEL     Status: Abnormal   Collection Time   05/17/11 10:08 AM      Component Value Range   Sodium 130 (*) 135 - 145 (mEq/L)   Potassium 3.8  3.5 - 5.1 (mEq/L)   Chloride 96  96 - 112 (mEq/L)   CO2 26  19 - 32 (mEq/L)   Glucose, Bld 104 (*) 70 - 99 (mg/dL)   BUN 6  6 - 23 (mg/dL)   Creatinine, Ser 7.82 (*) 0.50 - 1.10 (mg/dL)   Calcium 8.6  8.4 - 95.6 (mg/dL)   GFR calc non Af Amer >90  >90 (mL/min)   GFR calc Af Amer >90  >90 (mL/min)  CBC     Status: Abnormal   Collection Time   05/17/11 10:08 AM      Component Value Range   WBC 8.7  4.0 - 10.5 (K/uL)   RBC 2.81 (*) 3.87 - 5.11 (MIL/uL)   Hemoglobin 9.1 (*) 12.0 - 15.0 (g/dL)   HCT 21.3 (*) 08.6 - 46.0 (%)   MCV 93.2  78.0 - 100.0 (fL)   MCH 32.4  26.0 - 34.0 (pg)   MCHC 34.7  30.0 - 36.0 (g/dL)   RDW 57.8  46.9 - 62.9 (%)   Platelets 280  150 - 400 (K/uL)  PREALBUMIN     Status: Normal   Collection Time   05/17/11 10:08 AM      Component Value Range   Prealbumin 20.6  17.0 - 34.0 (mg/dL)    SUBJECTIVE:  Sore throat.  Increased neck pain after physical therapy yest.  OBJECTIVE:  Neck flat.  Mild facial swelling.  Drain functioning.  IMPRESSION:  Satisfactory check.  Hyponatremia, probably dilutional.  PLAN:  Hemovac to grenade.  Add Percocet for stronger pain relief.  D/C home when drainage < 25 cc/24  h.  Cephus Richer

## 2011-05-19 DIAGNOSIS — E871 Hypo-osmolality and hyponatremia: Secondary | ICD-10-CM | POA: Diagnosis not present

## 2011-05-19 LAB — CBC
Hemoglobin: 10.2 g/dL — ABNORMAL LOW (ref 12.0–15.0)
Platelets: 361 10*3/uL (ref 150–400)
RBC: 3.18 MIL/uL — ABNORMAL LOW (ref 3.87–5.11)
WBC: 13.4 10*3/uL — ABNORMAL HIGH (ref 4.0–10.5)

## 2011-05-19 LAB — BASIC METABOLIC PANEL
CO2: 27 mEq/L (ref 19–32)
Calcium: 9.5 mg/dL (ref 8.4–10.5)
Chloride: 89 mEq/L — ABNORMAL LOW (ref 96–112)
Glucose, Bld: 113 mg/dL — ABNORMAL HIGH (ref 70–99)
Potassium: 3.7 mEq/L (ref 3.5–5.1)
Sodium: 126 mEq/L — ABNORMAL LOW (ref 135–145)

## 2011-05-19 LAB — OSMOLALITY, URINE: Osmolality, Ur: 201 mOsm/kg — ABNORMAL LOW (ref 390–1090)

## 2011-05-19 NOTE — Progress Notes (Signed)
Noted pt's NA was 126 on lab results.  Called Dr. Jenne Pane and notified.  Will hold discharge for now and he will call hospitalists to come see pt.  Given # to call for admissions for hospitalists.  Kirtland Bouchard, RN

## 2011-05-19 NOTE — Consult Note (Signed)
Requesting physician: Dr. Jenne Pane  Reason for consultation: Hyponatremia  History of Present Illness: 65 yo female with history outlined below status post left modified radical neck dissection on 11/14, clinically doing well and tolerating current diet. Found to have mild hyponatremia during the hospitalization. She denies any major concerns at this tine, no chest pain, no SOB, no abdominal or urinary concerns. She tells me she is ambulating well and denies dizziness or unsteady gait,no difficulty with ambulation. She denies confusion, weakness, no headaches or visual changes.    Allergies:   Allergies  Allergen Reactions  . Ace Inhibitors Cough  . Hydrocodone Itching  . Hydromorphone Itching  . Tylox (Oxycodone-Acetaminophen) Itching  . Sulfa Antibiotics Itching and Rash      Past Medical History  Diagnosis Date  . Hyperlipidemia     STATES SHE NO LONGER HAS HI CHOLESTEROL  . Hypertension   . Headache   . Rhinitis   . Throat cancer 2010  . Breast ca 2008    Past Surgical History  Procedure Date  . Breast lumpectomy   . Appendectomy     Medications:  Scheduled Meds:   . amLODipine  5 mg Oral Daily  . bacitracin  1 application Topical Q8H  . fluocinonide cream   Topical BID  . letrozole  2.5 mg Oral Daily   Continuous Infusions:  PRN Meds:.cyclobenzaprine, diphenhydrAMINE, HYDROcodone-acetaminophen, ibuprofen, LORazepam, morphine, ondansetron (ZOFRAN) IV, ondansetron, ondansetron, oxyCODONE-acetaminophen, oxyCODONE-acetaminophen, senna-docusate  Social History:  reports that she quit smoking about 4 weeks ago. She does not have any smokeless tobacco history on file. She reports that she drinks about 4.2 ounces of alcohol per week. She reports that she does not use illicit drugs.  Family History  Problem Relation Age of Onset  . Hypertension    . Throat cancer    . Rheum arthritis    . Hypertension Mother   . Hyperlipidemia Mother   . Throat cancer Father      Review of Systems:  Constitutional: Denies fever, chills, diaphoresis, appetite change and fatigue.  Respiratory: Denies SOB, DOE, cough, chest tightness,  and wheezing.   Cardiovascular: Denies chest pain, palpitations and leg swelling.  Gastrointestinal: Denies nausea, vomiting, abdominal pain, diarrhea, constipation, blood in stool and abdominal distention.  Musculoskeletal: Denies myalgias, back pain, joint swelling, arthralgias and gait problem.  Neurological: Denies dizziness, seizures, syncope, weakness, light-headedness, numbness and headaches.    Physical Exam:  Filed Vitals:   05/19/11 0139 05/19/11 0514 05/19/11 0658 05/19/11 1000  BP: 105/67 132/74  122/62  Pulse: 91 92  85  Temp: 99 F (37.2 C) 98.9 F (37.2 C)  98.2 F (36.8 C)  TempSrc: Oral Oral  Oral  Resp: 18 18  20   Height:   5\' 2"  (1.575 m)   Weight:   51.2 kg (112 lb 14 oz)   SpO2: 98% 97%  97%     Intake/Output Summary (Last 24 hours) at 05/19/11 1321 Last data filed at 05/19/11 0900  Gross per 24 hour  Intake    840 ml  Output    710 ml  Net    130 ml    General: Alert, awake, oriented x3, in no acute distress. HEENT: No bruits, no goiter. Heart: Regular rate and rhythm, without murmurs, rubs, gallops. Lungs: Clear to auscultation bilaterally. Good air movement Abdomen: Soft, nontender, nondistended, positive bowel sounds. Extremities: No clubbing cyanosis or edema with positive pedal pulses. Neuro: Grossly intact, nonfocal.  Labs on Admission:  CBC:  Component Value Date/Time   WBC 13.4* 05/19/2011 0805   WBC 7.7 03/14/2011 0954   HGB 10.2* 05/19/2011 0805   HGB 13.1 03/14/2011 0954   HCT 30.1* 05/19/2011 0805   HCT 37.3 03/14/2011 0954   PLT 361 05/19/2011 0805   PLT 445* 03/14/2011 0954   MCV 94.7 05/19/2011 0805   MCV 95.2 03/14/2011 0954   NEUTROABS 4.8 12/27/2010 1151   LYMPHSABS 0.8 12/27/2010 1151   MONOABS 0.9 12/27/2010 1151   EOSABS 0.2 03/14/2011 0954   EOSABS 0.1  12/27/2010 1151   BASOSABS 0.0 03/14/2011 0954   BASOSABS 0.0 12/27/2010 1151    Basic Metabolic Panel:    Component Value Date/Time   NA 126* 05/19/2011 0805   NA 136 03/14/2011 0954   K 3.7 05/19/2011 0805   K 5.5* 03/14/2011 0954   CL 89* 05/19/2011 0805   CL 96* 03/14/2011 0954   CO2 27 05/19/2011 0805   CO2 26 03/14/2011 0954   BUN 7 05/19/2011 0805   BUN 11 03/14/2011 0954   CREATININE 0.44* 05/19/2011 0805   CREATININE 0.5* 03/14/2011 0954   GLUCOSE 113* 05/19/2011 0805   GLUCOSE 113 03/14/2011 0954   CALCIUM 9.5 05/19/2011 0805   CALCIUM 9.5 03/14/2011 0954    Radiological Exams on Admission: No results found.  Assessment/Plan Principal Problem:  Throat cancer - per primary team, appears that pt is doing well clinically  Active Problems:  Hyponatremia - unclear what the exact etiology is but it could be secondary to IVF she has been receiving to date. I agree with holding off on IVF but review of record indicated that she has hyponatremia at baseline 127-130. I am not seeing any offending medications that could contribute to this either. Will proceed with work up and obtain urine Na and serum osm. Will monitor strict I's and O's. Obtain BMP in AM. Will reassess in AM if D5W needed.   HYPERTENSION - well controlled   Time Spent on Admission: Over 30 minutes  MAGICK-Suleyman Ehrman 05/19/2011, 1:21 PM

## 2011-05-19 NOTE — Discharge Summary (Addendum)
Physician Discharge Summary  Patient ID: Taylor [name] MRN: [id number] DOB/AGE: 65-Jul-1947 65 y.o. 65 y.o.  Admit date: 05/16/2011 Discharge date: 05/19/2011  Admission Diagnoses: Throat cancer, neck mass  Discharge Diagnoses: same. Active Problems:  * No active hospital problems. *    Discharged Condition: good  Hospital Course: 65 year old female with throat cancer underwent left modified radical neck dissection on 11/14.  Observed in hospital thereafter with suction drain in place.  Output has decreased during her stay.  She has been tolerating a regular diet and ambulating.  She has worked with physical therapy.  On 11/17, her drain output has decreased and the drain was removed.  She was felt stable for discharge.  Consults: none  Significant Diagnostic Studies: None.  Treatments: IV hydration  Discharge Exam: Blood pressure 132/74, pulse 92, temperature 98.9 F (37.2 C), temperature source Oral, resp. rate 18, height 5\' 2"  (1.575 m), weight 51.2 kg (112 lb 14 oz), SpO2 97.00%. General appearance: alert, cooperative and no distress Throat: normal findings: buccal mucosa normal Neurologic: Cranial nerves: VII: lower facial muscle function left lower lip weak on the left Neck: left neck incision clean and intact with staples in place, no fluid collection  Disposition: Home or Self Care  Discharge Orders    Future Orders Please Complete By Expires   Diet - low sodium heart healthy      Increase activity slowly      Discharge instructions      Comments:   Clean incision twice daily with half strength peroxide and apply antibiotic ointment.     Current Discharge Medication List    START taking these medications   Details  bacitracin ointment Apply 1 application topically every 8 (eight) hours. Qty: 120 g    HYDROcodone-acetaminophen (NORCO) 5-325 MG per tablet Take 1-2 tablets by mouth every 4 (four) hours as needed. Qty: 30 tablet, Refills: 1      oxyCODONE-acetaminophen (PERCOCET) 5-325 MG per tablet Take 1-2 tablets by mouth every 6 (six) hours as needed. Qty: 30 tablet, Refills: 0      CONTINUE these medications which have NOT CHANGED   Details  amLODipine (NORVASC) 5 MG tablet Take 5 mg by mouth daily as needed. For blood pressure     cyclobenzaprine (FLEXERIL) 10 MG tablet Take 10 mg by mouth at bedtime as needed. For sleep     ibuprofen (ADVIL,MOTRIN) 200 MG tablet Take 200 mg by mouth every 6 (six) hours as needed. For pain     letrozole (FEMARA) 2.5 MG tablet Take 2.5 mg by mouth daily.      LORazepam (ATIVAN) 1 MG tablet Take 1 mg by mouth every 8 (eight) hours as needed. For anxiety   Associated Diagnoses: Personal history of malignant neoplasm of breast; Other and unspecified hyperlipidemia; Unspecified essential hypertension; Weight loss; Throat cancer; Arthritis; Dysphagia; Intentional underdosing of medication regimen by patient due to financial hardship; Tobacco use disorder    naproxen (NAPROXEN DR) 500 MG EC tablet Take 500 mg by mouth 2 (two) times daily as needed. For pain    ondansetron (ZOFRAN) 8 MG tablet Take by mouth 2 (two) times daily as needed. For nausea     pseudoephedrine (SUDAFED) 30 MG tablet Take 30 mg by mouth every 4 (four) hours as needed. For allergies     desoximetasone (TOPICORT) 0.25 % cream Apply 1 application topically 2 (two) times daily. Apply to affected area     prochlorperazine (COMPAZINE) 10 MG tablet Take 10 mg by  mouth every 6 (six) hours as needed. For nausea        Follow-up Information    Follow up with [facility]. Make an appointment in 5 days.   Contact information:   [facility] [address], [address] [place] [address] [phone]          Signed: Antony Contras 05/19/2011, 9:49 AM   Patient kept in hospital one additional day due to hyponatremia that was trending downward.  Hospitalist evaluated.   Sodium stabilized this morning and hospitalist felt she could be discharged.  Once again reviewed discharge instructions.  Exam unchanged.  Discharge home.

## 2011-05-19 NOTE — Progress Notes (Addendum)
Subjective: Did well overnight.  Slept about 4 hours.  Eating well.  Ambulating.  Pain controlled, doing well on Vicodin and then Percocet without any reaction.  Objective: Vital signs in last 24 hours: Temp:  [98.8 F (37.1 C)-100.9 F (38.3 C)] 98.9 F (37.2 C) (11/17 0514) Pulse Rate:  [84-92] 92  (11/17 0514) Resp:  [18-20] 18  (11/17 0514) BP: (105-172)/(64-74) 132/74 mmHg (11/17 0514) SpO2:  [97 %-100 %] 97 % (11/17 0514) Weight:  [51.2 kg (112 lb 14 oz)] 112 lb 14 oz (51.2 kg) (11/17 6295)  General appearance: alert, cooperative and no distress Neck: left neck incision clean and intact, staples in place, no fluid collection, mild edema Neurologic: Cranial nerves: VII: lower facial muscle function left lower lip weakness on the left  @LABLAST2 (wbc:2,hgb:2,hct:2,plt:2)  Basename 05/19/11 0805 05/17/11 1008  NA PENDING 130*  K PENDING 3.8  CL PENDING 96  CO2 27 26  GLUCOSE 113* 104*  BUN 7 6  CREATININE 0.44* 0.39*  CALCIUM 9.5 8.6    Medications: I have reviewed the patient's current medications.  Assessment/Plan: S/p left modified radical neck dissection. Drain output has decreased, removed drain.  Stable for discharge.  Reviewed wound care.  Follow-up next week.  Prior to discharge, sodium level came back 126, which is decreased since yesterday.  Will keep her in hospital and ask hospitalist to see her.   LOS: 3 days   Znya Albino D 05/19/2011, 9:29 AM

## 2011-05-20 LAB — BASIC METABOLIC PANEL
CO2: 27 mEq/L (ref 19–32)
Calcium: 9.4 mg/dL (ref 8.4–10.5)
GFR calc non Af Amer: >90 mL/min (ref >90)
Potassium: 3.7 mEq/L (ref 3.5–5.1)
Sodium: 129 mEq/L — ABNORMAL LOW (ref 135–145)

## 2011-05-20 NOTE — Progress Notes (Signed)
  Subjective: No events overnight.   Objective:  Vital signs in last 24 hours:  Filed Vitals:   05/19/11 1800 05/19/11 2200 05/20/11 0200 05/20/11 0600  BP: 144/63 137/105 146/68 113/67  Pulse: 75 80 92 89  Temp: 98.2 F (36.8 C) 98.7 F (37.1 C) 98.2 F (36.8 C) 98.9 F (37.2 C)  TempSrc: Oral Oral Oral Oral  Resp: 18 19 19 18   Height:      Weight:      SpO2: 99% 97% 95% 95%    Intake/Output from previous day:   Intake/Output Summary (Last 24 hours) at 05/20/11 0919 Last data filed at 05/20/11 0600  Gross per 24 hour  Intake   1570 ml  Output   1602 ml  Net    -32 ml    Physical Exam: General: Alert, awake, oriented x3, in no acute distress. HEENT: No bruits, no goiter. Heart: Regular rate and rhythm, without murmurs, rubs, gallops. Lungs: Clear to auscultation bilaterally. Abdomen: Soft, nontender, nondistended, positive bowel sounds. Extremities: No clubbing cyanosis or edema with positive pedal pulses. Neuro: Grossly intact, nonfocal.   Lab Results:  Basic Metabolic Panel:    Component Value Date/Time   NA 129* 05/20/2011 0640   NA 136 03/14/2011 0954   K 3.7 05/20/2011 0640   K 5.5* 03/14/2011 0954   CL 93* 05/20/2011 0640   CL 96* 03/14/2011 0954   CO2 27 05/20/2011 0640   CO2 26 03/14/2011 0954   BUN 8 05/20/2011 0640   BUN 11 03/14/2011 0954   CREATININE 0.38* 05/20/2011 0640   CREATININE 0.5* 03/14/2011 0954   GLUCOSE 101* 05/20/2011 0640   GLUCOSE 113 03/14/2011 0954   CALCIUM 9.4 05/20/2011 0640   CALCIUM 9.5 03/14/2011 0954   CBC:    Component Value Date/Time   WBC 13.4* 05/19/2011 0805   WBC 7.7 03/14/2011 0954   HGB 10.2* 05/19/2011 0805   HGB 13.1 03/14/2011 0954   HCT 30.1* 05/19/2011 0805   HCT 37.3 03/14/2011 0954   PLT 361 05/19/2011 0805   PLT 445* 03/14/2011 0954   MCV 94.7 05/19/2011 0805   MCV 95.2 03/14/2011 0954   NEUTROABS 4.8 12/27/2010 1151   LYMPHSABS 0.8 12/27/2010 1151   MONOABS 0.9 12/27/2010 1151   EOSABS 0.2 03/14/2011  0954   EOSABS 0.1 12/27/2010 1151   BASOSABS 0.0 03/14/2011 0954   BASOSABS 0.0 12/27/2010 1151    Recent Results (from the past 240 hour(s))  SURGICAL PCR SCREEN     Status: Normal   Collection Time   05/10/11 11:31 AM      Component Value Range Status Comment   MRSA, PCR NEGATIVE  NEGATIVE  Final    Staphylococcus aureus NEGATIVE  NEGATIVE  Final     Studies/Results: No results found.  Medications: Scheduled Meds:   . amLODipine  5 mg Oral Daily  . bacitracin  1 application Topical Q8H  . fluocinonide cream   Topical BID  . letrozole  2.5 mg Oral Daily   Continuous Infusions:  PRN Meds:.cyclobenzaprine, diphenhydrAMINE, HYDROcodone-acetaminophen, ibuprofen, LORazepam, morphine, ondansetron (ZOFRAN) IV, ondansetron, ondansetron, oxyCODONE-acetaminophen, oxyCODONE-acetaminophen, senna-docusate  Assessment/Plan:  Principal Problem:  *Throat cancer - per primary team  Active Problems:  Hyponatremia - now improving and at pt's baseline, will ask PCP to follow up in an outpatient setting   HYPERTENSION - at goal   DISPOSITION - will sign off    LOS: 4 days   MAGICK-Shyanna Klingel 05/20/2011, 9:19 AM

## 2011-05-20 NOTE — Progress Notes (Signed)
D/C instructions given and discussed with pt. Supplies given for wound care. Prescriptions given and discussed with pt. Pt had no questions. Pt status unchanged from morning assessment. D/C to home.

## 2011-05-22 ENCOUNTER — Encounter (HOSPITAL_COMMUNITY): Payer: Self-pay | Admitting: Otolaryngology

## 2011-05-28 ENCOUNTER — Ambulatory Visit: Payer: Medicare Other | Admitting: Radiation Oncology

## 2011-06-11 ENCOUNTER — Ambulatory Visit: Payer: Self-pay | Admitting: Radiation Oncology

## 2011-06-16 ENCOUNTER — Telehealth: Payer: Self-pay | Admitting: Oncology

## 2011-06-16 NOTE — Telephone Encounter (Signed)
Talked to pt, gave her appt date for 07/19/11 , lab and md

## 2011-07-19 ENCOUNTER — Other Ambulatory Visit: Payer: Self-pay | Admitting: Oncology

## 2011-07-19 ENCOUNTER — Other Ambulatory Visit (HOSPITAL_BASED_OUTPATIENT_CLINIC_OR_DEPARTMENT_OTHER): Payer: Medicare Other | Admitting: Lab

## 2011-07-19 ENCOUNTER — Ambulatory Visit (HOSPITAL_BASED_OUTPATIENT_CLINIC_OR_DEPARTMENT_OTHER): Payer: Medicare Other | Admitting: Oncology

## 2011-07-19 ENCOUNTER — Telehealth: Payer: Self-pay | Admitting: Oncology

## 2011-07-19 VITALS — BP 123/71 | HR 18 | Temp 98.7°F | Ht 62.0 in | Wt 105.0 lb

## 2011-07-19 DIAGNOSIS — K219 Gastro-esophageal reflux disease without esophagitis: Secondary | ICD-10-CM

## 2011-07-19 DIAGNOSIS — C50919 Malignant neoplasm of unspecified site of unspecified female breast: Secondary | ICD-10-CM

## 2011-07-19 DIAGNOSIS — C140 Malignant neoplasm of pharynx, unspecified: Secondary | ICD-10-CM

## 2011-07-19 DIAGNOSIS — C12 Malignant neoplasm of pyriform sinus: Secondary | ICD-10-CM

## 2011-07-19 DIAGNOSIS — M810 Age-related osteoporosis without current pathological fracture: Secondary | ICD-10-CM

## 2011-07-19 LAB — COMPREHENSIVE METABOLIC PANEL
Albumin: 4.2 g/dL (ref 3.5–5.2)
CO2: 27 mEq/L (ref 19–32)
Chloride: 97 mEq/L (ref 96–112)
Glucose, Bld: 94 mg/dL (ref 70–99)
Potassium: 5.1 mEq/L (ref 3.5–5.3)
Sodium: 132 mEq/L — ABNORMAL LOW (ref 135–145)
Total Protein: 6.9 g/dL (ref 6.0–8.3)

## 2011-07-19 LAB — CBC WITH DIFFERENTIAL/PLATELET
Eosinophils Absolute: 0.2 10*3/uL (ref 0.0–0.5)
MONO#: 0.7 10*3/uL (ref 0.1–0.9)
NEUT#: 4 10*3/uL (ref 1.5–6.5)
RBC: 3.6 10*6/uL — ABNORMAL LOW (ref 3.70–5.45)
RDW: 13.3 % (ref 11.2–14.5)
WBC: 5.7 10*3/uL (ref 3.9–10.3)

## 2011-07-19 LAB — TSH: TSH: 4.742 u[IU]/mL — ABNORMAL HIGH (ref 0.350–4.500)

## 2011-07-19 MED ORDER — OMEPRAZOLE 20 MG PO CPDR: 20.0000 mg | DELAYED_RELEASE_CAPSULE | Freq: Every day | ORAL | Status: DC

## 2011-07-19 NOTE — Telephone Encounter (Signed)
Gv pt appt for [id number] 

## 2011-07-19 NOTE — Progress Notes (Signed)
Taylor [name] OFFICE PROGRESS NOTE  Cc:  Taylor Harp, MD, MD  DIAGNOSIS:   DIAGNOSIS; PAST AND CURRENT THERAPY:    1.  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  She received concurrent chemoradiation therapy with weekly cisplatin, which started 04/18/2009 and ended 06/06/2009.  Treatment dates were from April 18, 2009 through June 13, 2009.  She had local recurrence and underwent on 05/16/2011 modified level IV left neck dissection with positive margin and gross muscle invasion.   2.  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  She was treated with three cycles of Cytoxan, Taxotere and could not finish all six planned cycles secondary to severe myalgia/arthralgia.  She received adjuvant radiation therapy and was started on Femara from which she was nonadherent until she was diagnosed with piriform sinus SCC.  She is now again on adjuvant Femara since 2011.   INTERVAL HISTORY: Taylor [name] 66 y.o. female returns for regular follow up with her friend.  She reported that it took her 2 months to recover from the recent modified neck dissection.  She had neck pain, fibrosis, restricted range of motion.  She had undergone occupational therapy with improvement of her range of neck motion.  She however has recently developed skin burn sensation of the area of recent neck dissection.  She has not been using biofine cream.  The skin burn is stable with mild erythema without blister, purulent discharge, or skin breakthrough.  There is no alleviating or exacerbating factors.  She still has dysphagia; she underwent esophageal dilation with GI last year.  She had decreased appetite after neck dissection, and she had weight loss about 10-15 lb; however, she thinks that this weight  loss has plateau.  With regard to her history of breast cancer, she denies any palpable breast mass, nipple discharge, skin thickening or erythema.    Patient denies headache, visual changes, confusion, drenching night sweats, palpable lymph node swelling, mucositis, odynophagia, nausea vomiting, jaundice, chest pain, palpitation, shortness of breath, dyspnea on exertion, productive cough, gum bleeding, epistaxis, hematemesis, hemoptysis, abdominal pain, abdominal swelling, early satiety, melena, hematochezia, hematuria, skin rash, spontaneous bleeding, joint swelling, joint pain, heat or cold intolerance, bowel bladder incontinence, back pain, focal motor weakness, paresthesia, depression, suicidal or homocidal ideation, feeling hopelessness.   MEDICAL HISTORY: Past Medical History  Diagnosis Date  . Hyperlipidemia     STATES SHE NO LONGER HAS HI CHOLESTEROL  . Hypertension   . Headache   . Rhinitis   . Throat cancer 2010  . Breast ca 2008    SURGICAL HISTORY:  Past Surgical History  Procedure Date  . Breast lumpectomy   . Appendectomy   . Panendoscopy 05/16/2011    Procedure: PANENDOSCOPY;  Surgeon: Cephus Richer;  Location: [facility] OR;  Service: ENT;  Laterality: N/A;  Direct laryngoscopy, esophagoscopy  . Radical neck dissection 05/16/2011    Procedure: RADICAL NECK DISSECTION;  Surgeon: Cephus Richer;  Location: [facility] OR;  Service: ENT;  Laterality: Left;  Modified radical left neck dissection     MEDICATIONS: Current Outpatient Prescriptions  Medication Sig Dispense Refill  . citalopram (CELEXA) 40 MG tablet Take 40 mg by mouth daily.        . cyclobenzaprine (FLEXERIL) 10 MG tablet Take 10 mg by mouth at bedtime as needed.  For sleep       . desoximetasone (TOPICORT) 0.25 % cream Apply 1 application topically 2 (two) times daily. Apply to affected area       . ibuprofen (ADVIL,MOTRIN) 200 MG tablet Take 200 mg by mouth every 6 (six) hours as needed. For pain       . letrozole  (FEMARA) 2.5 MG tablet Take 2.5 mg by mouth daily.        Marland Kitchen LORazepam (ATIVAN) 1 MG tablet Take 1 mg by mouth every 8 (eight) hours as needed. For anxiety      . naproxen (NAPROXEN DR) 500 MG EC tablet Take 500 mg by mouth 2 (two) times daily as needed. For pain      . ondansetron (ZOFRAN) 8 MG tablet Take by mouth 2 (two) times daily as needed. For nausea       . pseudoephedrine (SUDAFED) 30 MG tablet Take 30 mg by mouth every 4 (four) hours as needed. For allergies       . amLODipine (NORVASC) 5 MG tablet Take 5 mg by mouth daily as needed. For blood pressure      . omeprazole (PRILOSEC) 20 MG capsule Take 1 capsule (20 mg total) by mouth daily.  90 capsule  3    ALLERGIES:  is allergic to ace inhibitors; hydrocodone; hydromorphone; tylox; and sulfa antibiotics.  REVIEW OF SYSTEMS:  The rest of the 14-point review of system was negative.   Filed Vitals:   07/19/11 0943  BP: 123/71  Pulse: 18  Temp: 98.7 F (37.1 C)   Wt Readings from Last 3 Encounters:  07/19/11 105 lb (47.628 kg)  05/19/11 112 lb 14 oz (51.2 kg)  05/19/11 112 lb 14 oz (51.2 kg)   ECOG Performance status: 1  PHYSICAL EXAMINATION:  General:  Thin-appearing woman in no acute distress.  Eyes:  no scleral icterus.  ENT:  There were no oropharyngeal lesions.  Neck was without thyromegaly.  Left neck resection scar was fibrotic with mild erythema without palpable node/mass/open wound/ pain on palpation.  Lymphatics:  Negative cervical, supraclavicular or axillary adenopathy.  Respiratory: lungs were clear bilaterally without wheezing or crackles.  Cardiovascular:  Regular rate and rhythm, S1/S2, without murmur, rub or gallop.  There was no pedal edema.  GI:  abdomen was soft, flat, nontender, nondistended, without organomegaly.  Muscoloskeletal:  no spinal tenderness of palpation of vertebral spine.  Skin exam was without echymosis, petichae.  Neuro exam was nonfocal.  Patient was able to get on and off exam table without  assistance.  Gait was normal.  Patient was alerted and oriented.  Attention was good.   Language was appropriate.  Mood was normal without depression.  Speech was not pressured.  Thought content was not tangential.  Bilateral breast exam was negative for palpable breast mass, nipple discharge, skin thickening or erythema.    LABORATORY/RADIOLOGY DATA: WBC 5.7; Hgb 11.6; Plt 464 Cr 0.59; Ca 9.8; LFT within normal limit.  TSH 4.742  ASSESSMENT AND PLAN:    1. History of oropharynx squamous cell carcinoma:  I discussed with Taylor [name] and her partner that she has recent disease recurrence with neck dissection with grossly positive margin.  They were advised to seek 2nd surgical opinion at [name] to see if more extensive resection is feasible since there is no role for more radiation at this time.  She does not clinically have signs and symptoms of metastatic disease to warrant systemic chemo.  Her CXR in 05/2011 was  negative for lung met; and she does not have focal bone pain.  Thus, by default, further resection to render her diease free would be the only option.  Without further resection, she will eventually have local failure, then distant met; then limited life expectancy.  However, she is not interested at this time since she had a hard time recovering from neck dissection.  She is very comfortable with her decision and well aware of its complication on her life expectancy.  As such, there is no role for surveillance CT unless she has concerning symptoms, and she desires palliative measures.  2. History of breast cancer:  She is doing relatively well on letrozole 1 mg p.o. daily.  This was started in 2011 and is due to finish in 2016.   3. Hot flashes:  She has good symptom contro with  citalopram 40 mg p.o. daily.     4. Surveillance of breast cancer:  She is due for mammogram and bone density in March 2013 which were ordred today.  5. Esophageal stricture and GERD:  She is following with Dr.  Matthias Hughs and underwent a dilatation n in 2012.  She requested Omeprazole 20mg  PO daily which I prescribed. 6. Hypertension:  Well controlled on amlodipine 5 mg p.o. daily. 7. Smoking: she has quit since late 2012.  8. Osteoporosis on last DEXA 08/2009.  In the past, she were not adherent to her breast cancer med; coupled with GERD, I did not add on fosamax.  However, on the next bone density in 08/2011, if her osteoporosis persists or worsens, I may consider Prolia.  Follow up with me in about 5 months.  She also follows with ENT.  The length of time of the face-to-face encounter was 25  minutes. More than 50% of time was spent counseling and coordination of care.

## 2011-07-20 ENCOUNTER — Telehealth: Payer: Self-pay | Admitting: Oncology

## 2011-07-20 NOTE — Telephone Encounter (Signed)
called pts home lmovm for mammo and bone density scan appt for 09/27/2011 @ [facility] and to rtn call to me to confirm appts

## 2011-07-25 ENCOUNTER — Telehealth: Payer: Self-pay | Admitting: Oncology

## 2011-07-25 NOTE — Telephone Encounter (Signed)
pt rtn call and confirmed mammo and bon density appts

## 2011-07-31 ENCOUNTER — Telehealth: Payer: Self-pay | Admitting: Nutrition

## 2011-07-31 NOTE — Telephone Encounter (Signed)
Patient was identified at risk for malnutrition via the Nutrition Risk Screen.  I attempted to contact patient by phone however, there was no answer.

## 2011-08-06 ENCOUNTER — Ambulatory Visit: Payer: Medicare Other | Admitting: Nutrition

## 2011-08-06 ENCOUNTER — Encounter: Payer: Self-pay | Admitting: Nutrition

## 2011-08-06 NOTE — Assessment & Plan Note (Signed)
Ms. Ballengee is a 66 year old female patient of Dr. Lodema Pilot diagnosed with a local recurrence of left piriform sinus cancer.  She is status post concurrent chemoradiation treatments in 2010 and a neck dissection November of 2012.    History also includes hyperlipidemia, hypertension,breast cancer, anxiety, tobacco, osteoporosis, arthritis, dysphasia, esophageal stricture and GERD.  MEDICATIONS INCLUDE:  Celexa, Flexeril, Femara, Ativan, Zofran and Prilosec.  LABS:  Sodium of 1.32 and a TSH of 4.74.  HEIGHT:  62 inches. WEIGHT:  Today 107.4 pounds. USUAL BODY WEIGHT:  145 pounds.  Weight documented as 112 pounds November 2012.  Patient was identified to be at risk secondary to the nutrition risk screening.  She reports poor appetite and thick saliva.  She also has some taste alterations. She reports that she tries to consume the equivalent of Ensure Plus several times a day.  She does not enjoy sweets very much and has to be careful with really spicy foods.  She does have difficulty swallowing, however, does not reveal that she has been on a restricted diet of any kind or a thickened liquid diet of any kind.  NUTRITION DIAGNOSIS:  Food and nutrition related knowledge deficit related to recurrence of piriform sinus cancer as evidenced by no prior need for nutrition related information.  INTERVENTION:  I educated the patient on the importance of consuming higher calorie/higher protein in small amounts throughout the day.  I focused in on healthy fats and protein foods that she should be able to tolerate.  I gave her suggestions for altering taste of food so that she would be able to consume more beverages that were not as sweet.  I have provided her with samples and fact sheets for her to refer to.  I have provided my contact information.  MONITORING/EVALUATION (GOALS):  The patient will tolerate an increased oral diet with increased protein and calories to promote slow safe weight gain.  NEXT VISIT:   Patient prefers to call me to followup after she has had an opportunity to explore some of the options we talked about today.    ______________________________ Zenovia Jarred, RD, LDN Clinical Nutrition Specialist BN/MEDQ  D:  08/06/2011  T:  08/06/2011  Job:  [id number]

## 2011-08-24 ENCOUNTER — Other Ambulatory Visit: Payer: Medicare Other | Admitting: Lab

## 2011-09-10 ENCOUNTER — Other Ambulatory Visit: Payer: Self-pay | Admitting: Radiation Oncology

## 2011-09-13 ENCOUNTER — Other Ambulatory Visit: Payer: Self-pay

## 2011-09-21 ENCOUNTER — Telehealth: Payer: Self-pay | Admitting: *Deleted

## 2011-09-21 MED ORDER — LORAZEPAM 0.5 MG PO TABS: 0.5000 mg | ORAL_TABLET | Freq: Three times a day (TID) | ORAL | Status: AC | PRN

## 2011-09-21 NOTE — Telephone Encounter (Signed)
Pt left VM earlier today requesting refill on Ativan originally prescribed by Dr. Roselind Messier.  Called pt back and informed pt Dr. Gaylyn Rong does not manage Anxiety or anxiety medication,  Instructed pt to contact her PCP. Pt became/sounded upset on phone and states not taking Ativan for nausea but is taking prior to meals to prevent nausea and vomiting from choking w/ eating.  Pt says "It is the only thing that works so I can eat."  States zofran does not help, but kills her appetite.  States ativan relaxes her throat so she can eat w/o choking.   Informed Pt I will notify Dr. Gaylyn Rong and call her back again w/ his reply.

## 2011-09-21 NOTE — Telephone Encounter (Signed)
Ok to refill Ativan 0.5mg  for nausea per Dr. Gaylyn Rong.  Rx called into [facility] on [address] and I called pt to inform her of refill. She verbalized understanding.

## 2011-09-27 ENCOUNTER — Ambulatory Visit
Admission: RE | Admit: 2011-09-27 | Discharge: 2011-09-27 | Disposition: A | Discharge status: Home / Self Care | Payer: Medicare Other | Source: Ambulatory Visit | Attending: Oncology | Admitting: Oncology

## 2011-09-27 DIAGNOSIS — C50919 Malignant neoplasm of unspecified site of unspecified female breast: Secondary | ICD-10-CM

## 2011-10-23 NOTE — Progress Notes (Signed)
Received office notes from Dr. Lunette Stands @ [facility]; forwarded to Dr. Gaylyn Rong.

## 2011-11-16 ENCOUNTER — Telehealth: Payer: Self-pay | Admitting: *Deleted

## 2011-11-16 MED ORDER — ONDANSETRON HCL 8 MG PO TABS: 8.0000 mg | ORAL_TABLET | Freq: Two times a day (BID) | ORAL | Status: DC | PRN

## 2011-11-16 NOTE — Telephone Encounter (Signed)
Pt called for refill on zofran.  States continues to take ativan regularly which helps prevent nausea and helps her eat, but also needs zofran prn.  [facility] and I instructed pt to call back if they do not receive it.  She verbalized understanding.

## 2011-12-10 ENCOUNTER — Ambulatory Visit (HOSPITAL_BASED_OUTPATIENT_CLINIC_OR_DEPARTMENT_OTHER): Payer: Medicare Other | Admitting: Oncology

## 2011-12-10 ENCOUNTER — Other Ambulatory Visit (HOSPITAL_BASED_OUTPATIENT_CLINIC_OR_DEPARTMENT_OTHER): Payer: Medicare Other | Admitting: Lab

## 2011-12-10 ENCOUNTER — Telehealth: Payer: Self-pay | Admitting: Oncology

## 2011-12-10 ENCOUNTER — Encounter: Payer: Self-pay | Admitting: Oncology

## 2011-12-10 VITALS — BP 134/73 | HR 76 | Temp 99.3°F | Ht 62.0 in | Wt 103.8 lb

## 2011-12-10 DIAGNOSIS — C50419 Malignant neoplasm of upper-outer quadrant of unspecified female breast: Secondary | ICD-10-CM

## 2011-12-10 DIAGNOSIS — C50919 Malignant neoplasm of unspecified site of unspecified female breast: Secondary | ICD-10-CM

## 2011-12-10 DIAGNOSIS — R5383 Other fatigue: Secondary | ICD-10-CM

## 2011-12-10 DIAGNOSIS — Z853 Personal history of malignant neoplasm of breast: Secondary | ICD-10-CM

## 2011-12-10 DIAGNOSIS — E871 Hypo-osmolality and hyponatremia: Secondary | ICD-10-CM

## 2011-12-10 DIAGNOSIS — F17200 Nicotine dependence, unspecified, uncomplicated: Secondary | ICD-10-CM

## 2011-12-10 DIAGNOSIS — C140 Malignant neoplasm of pharynx, unspecified: Secondary | ICD-10-CM

## 2011-12-10 DIAGNOSIS — C12 Malignant neoplasm of pyriform sinus: Secondary | ICD-10-CM

## 2011-12-10 DIAGNOSIS — M810 Age-related osteoporosis without current pathological fracture: Secondary | ICD-10-CM

## 2011-12-10 HISTORY — DX: Other fatigue: R53.83

## 2011-12-10 LAB — CBC WITH DIFFERENTIAL/PLATELET
BASO%: 1.2 % (ref 0.0–2.0)
LYMPH%: 9.2 % — ABNORMAL LOW (ref 14.0–49.7)
MCHC: 34.7 g/dL (ref 31.5–36.0)
MONO#: 0.8 10*3/uL (ref 0.1–0.9)
Platelets: 410 10*3/uL — ABNORMAL HIGH (ref 145–400)
RBC: 3.5 10*6/uL — ABNORMAL LOW (ref 3.70–5.45)
WBC: 6.1 10*3/uL (ref 3.9–10.3)
lymph#: 0.6 10*3/uL — ABNORMAL LOW (ref 0.9–3.3)

## 2011-12-10 LAB — TSH: TSH: 4.974 u[IU]/mL — ABNORMAL HIGH (ref 0.350–4.500)

## 2011-12-10 LAB — COMPREHENSIVE METABOLIC PANEL
ALT: 17 U/L (ref 0–35)
CO2: 29 mEq/L (ref 19–32)
Sodium: 131 mEq/L — ABNORMAL LOW (ref 135–145)
Total Bilirubin: 0.4 mg/dL (ref 0.3–1.2)
Total Protein: 6.2 g/dL (ref 6.0–8.3)

## 2011-12-10 NOTE — Telephone Encounter (Signed)
Gv pt appt for nov2013 °

## 2011-12-10 NOTE — Progress Notes (Signed)
[facility]  Telephone:([phone]) [phone] Fax:([phone]) [phone]   OFFICE PROGRESS NOTE   Cc:  [name], MD, MD  DIAGNOSIS; PAST AND CURRENT THERAPY:    1. 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 She received concurrent chemoradiation therapy with weekly cisplatin, which started 04/18/2009 and ended 06/06/2009. Treatment dates were from April 18, 2009 through June 13, 2009. She had local recurrence and underwent on 05/16/2011 modified level IV left neck dissection with positive margin and gross muscle invasion.  2. 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 She was treated with three cycles of Cytoxan, Taxotere and could not finish all six planned cycles secondary to severe myalgia/arthralgia. She received adjuvant radiation therapy and was started on Femara from which she was nonadherent until she was diagnosed with piriform sinus SCC. She is now again on adjuvant Femara since 2011.   INTERVAL HISTORY: Taylor [name] 66 y.o. female returns for regular follow up by herself.  She reported that she has had tight left neck from chemoradiation and neck dissection.  She has been doing PT/OT exercises.  However, she still has problem with eating dry/large food bolus.  She has had further weight loss.  He still smoke cigarettes.  She still has nausea/vomiting with eating.  She has been taking Zofran and Ativan which have helped her.  She denied palpable neck mass.  She is still taking aromatase inhibitor medication for history of breast cancer without problem.  She has right hip pain when she moves it certain way.  She denies back pain, bowel/bladder incontience, paresthesia.  She has mild fatigue; however, she is still independent of activities of daily living.  Her partner  leaves for work several times a weekly; and patient is able to take care of herself.   Patient denies headache, visual changes, confusion, drenching night sweats, palpable lymph node swelling, mucositis, jaundice, chest pain, palpitation, shortness of breath, dyspnea on exertion, productive cough, gum bleeding, epistaxis, hematemesis, hemoptysis, abdominal pain, abdominal swelling, early satiety, melena, hematochezia, hematuria, skin rash, spontaneous bleeding, joint swelling, heat or cold intolerance, bowel bladder incontinence, back pain, focal motor weakness, paresthesia.  She thinks that her mood is good.  She denies depression, suicidal thoughts.    Past Medical History  Diagnosis Date  . Hyperlipidemia     STATES SHE NO LONGER HAS HI CHOLESTEROL  . Hypertension   . Headache   . Rhinitis   . Throat cancer 2010  . Breast CA 2008  . Fatigue 12/10/2011    Past Surgical History  Procedure Date  . Breast lumpectomy   . Appendectomy   . Panendoscopy 05/16/2011    Procedure: PANENDOSCOPY;  Surgeon: Cephus Richer;  Location: [facility] OR;  Service: ENT;  Laterality: N/A;  Direct laryngoscopy, esophagoscopy  . Radical neck dissection 05/16/2011    Procedure: RADICAL NECK DISSECTION;  Surgeon: Cephus Richer;  Location: [facility] OR;  Service: ENT;  Laterality: Left;  Modified radical left neck dissection     Current Outpatient Prescriptions  Medication Sig Dispense Refill  . acetaminophen (TYLENOL) 325 MG tablet Take 650 mg by mouth every 6 (six) hours as needed.      . citalopram (CELEXA) 40 MG tablet Take 40 mg by mouth daily.        . cyclobenzaprine (FLEXERIL) 10 MG tablet  Take 10 mg by mouth at bedtime as needed. For sleep Refill 09/13/2011 faxed to [facility].      Marland Kitchen desoximetasone (TOPICORT) 0.25 % cream Apply 1 application topically 2 (two) times daily. Apply to affected area       . diclofenac (VOLTAREN) 75 MG EC tablet Take 75 mg by mouth 2 (two) times daily.       Marland Kitchen letrozole  (FEMARA) 2.5 MG tablet Take 2.5 mg by mouth daily.        Marland Kitchen LORazepam (ATIVAN) 0.5 MG tablet Place 0.5 mg under the tongue every 6 (six) hours as needed. Anticipatory nausea      . omeprazole (PRILOSEC) 20 MG capsule Take 1 capsule (20 mg total) by mouth daily.  90 capsule  3  . ondansetron (ZOFRAN) 8 MG tablet Take 1 tablet (8 mg total) by mouth every 12 (twelve) hours as needed for nausea. For nausea  30 tablet  3  . pseudoephedrine (SUDAFED) 30 MG tablet Take 30 mg by mouth every 4 (four) hours as needed. For allergies         ALLERGIES:  is allergic to ace inhibitors; hydrocodone; hydromorphone; tylox; and sulfa antibiotics.  REVIEW OF SYSTEMS:  The rest of the 14-point review of system was negative.   Filed Vitals:   12/10/11 0904  BP: 134/73  Pulse: 76  Temp: 99.3 F (37.4 C)   Wt Readings from Last 3 Encounters:  12/10/11 103 lb 12.8 oz (47.083 kg)  08/06/11 107 lb 6.4 oz (48.716 kg)  07/19/11 105 lb (47.628 kg)   ECOG Performance status: 1-2  PHYSICAL EXAMINATION:   General: Thin-appearing, cachectic,  woman in no acute distress. Eyes: no scleral icterus. ENT: There were no oropharyngeal lesions. Neck was without thyromegaly. Left neck resection scar was fibrotic with mild erythema without palpable node/mass/open wound/ pain on palpation. Lymphatics: Negative cervical, supraclavicular or axillary adenopathy. Respiratory: lungs were clear bilaterally without wheezing or crackles. Cardiovascular: Regular rate and rhythm, S1/S2, without murmur, rub or gallop. There was no pedal edema. GI: abdomen was soft, flat, nontender, nondistended, without organomegaly. Muscoloskeletal: no spinal tenderness of palpation of vertebral spine.  She had pain in the right lateral hip with abd/adduct/ flexion/extenstion of the right hip.  Skin exam was without echymosis, petichae. Neuro exam was nonfocal. Patient was able to get on and off exam table without assistance. Gait was normal. Patient was  alerted and oriented. Attention was good. Language was appropriate. Mood was normal without depression. Speech was not pressured. Thought content was not tangential. Bilateral breast exam was negative for palpable breast mass, nipple discharge, skin thickening or erythema.     LABORATORY/RADIOLOGY DATA:  Lab Results  Component Value Date   WBC 6.1 12/10/2011   HGB 11.7 12/10/2011   HCT 33.9* 12/10/2011   PLT 410* 12/10/2011   GLUCOSE 94 07/19/2011   CHOL 322* 12/27/2010   TRIG 83.0 12/27/2010   HDL 168.00 12/27/2010   LDLDIRECT 107.5 12/27/2010   ALKPHOS 95 07/19/2011   ALT 10 07/19/2011   AST 14 07/19/2011   NA 132* 07/19/2011   K 5.1 07/19/2011   CL 97 07/19/2011   CREATININE 0.59 07/19/2011   BUN 10 07/19/2011   CO2 27 07/19/2011   INR 0.9 03/09/2009     ASSESSMENT AND PLAN:   1. History of oropharynx squamous cell carcinoma:  She has recurrent disease.  She is s/p left neck dissection with positive margin.  She is not interested in further resection or  chemotherapy.  I agreed with her decision since she has had a difficult time with the original chemoradiation.  She still smokes cigarette  and is not willing to quit.  With her borderline performance status, I'm not sure if she will be able to withstand aggressive resection or palliative chemotherapy.  There is no reason to obtain surveillance neck CT with incurable disease.  2. History of breast cancer: She is doing relatively well on letrozole 1 mg p.o. daily. This was started in 2011 and is due to finish in 2016.  3. Hot flashes: She has good symptom contro with citalopram 40 mg p.o. daily.  4. Surveillance of breast cancer: her last mammogram in 08/2011 was negative.  Given her incurable HNSCC, I query whether breast mammogram is appropriate.  We'll address this issue again in 08/2012 depending on how she holds up then.  5. Esophageal stricture and GERD: She is following with Dr. Matthias Hughs and underwent a dilatation n in 2012.  She may need more  dilation if she has worsening dysphagia.  Her dysphagia could also be due to her recurrent HNSCC.  6. Hypertension: diet control only due to her weight loss.   7. Smoking: still smoking now.  She does not want to quit due to presence of incurable HNSCC.  8. Osteoporosis on last DEXA 08/2009:  Not pertinent due to incurable HNSCC.   9. Right hip pain:  Most likely DJD or OA.  She is seeing Ortho for treatment.  OK from Onc standpoint for steroid injection if deemed appropriate by Ortho.  Her pain is rather limited.  If it worsens and more diffuse, I may consider bone scan to rule out bone met.  I still have low suspicion for bone met at this time.  10. Follow up:  In about 5 months.  11. Code status:  I discussed with Ms. Schermerhorn the incurable head/neck cancer.  She told me that she had previously discussed with her partner and would like to be DNR/DNI in case she has cardiopulmonary arrest.  I agree with this decision.  12. Dispo:  We discussed that in the future, if and when she becomes more debilitated from her disease, we will consider hospice referral.  She wanted to delay this referral for now since she is still functional.     The length of time of the face-to-face encounter was 25 minutes. More than 50% of time was spent counseling and coordination of care.

## 2011-12-11 ENCOUNTER — Encounter: Payer: Self-pay | Admitting: Oncology

## 2011-12-11 ENCOUNTER — Telehealth: Payer: Self-pay | Admitting: Oncology

## 2011-12-11 ENCOUNTER — Other Ambulatory Visit: Payer: Self-pay | Admitting: Oncology

## 2011-12-11 ENCOUNTER — Telehealth: Payer: Self-pay | Admitting: *Deleted

## 2011-12-11 DIAGNOSIS — E039 Hypothyroidism, unspecified: Secondary | ICD-10-CM

## 2011-12-11 HISTORY — DX: Hypothyroidism, unspecified: E03.9

## 2011-12-11 MED ORDER — LEVOTHYROXINE SODIUM 50 MCG PO TABS: 50.0000 ug | ORAL_TABLET | Freq: Every day | ORAL | Status: DC

## 2011-12-11 NOTE — Telephone Encounter (Signed)
Left VM for pt requesting she call back so I can relay Dr. Lodema Pilot message about starting Synthroid. See Dr. Lodema Pilot note below.

## 2011-12-11 NOTE — Telephone Encounter (Signed)
Pt returned call and I informed her of new order for Synthroid,  To take daily and lab appt in 2 months..  Pt verbalized understanding.

## 2011-12-11 NOTE — Telephone Encounter (Signed)
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  I recommended starting Synthroid PO daily.  I have electronically placed this with her pharmacy.  Please inform her that she will have an appointment in about 2 months to ensure that her thyroid hormone is at the appropriate level.  Thanks.

## 2011-12-11 NOTE — Telephone Encounter (Signed)
called pt schedule lab for 08/12

## 2012-01-02 ENCOUNTER — Encounter: Payer: Self-pay | Admitting: Dietician

## 2012-01-02 NOTE — Progress Notes (Signed)
Brief Out-patient Oncology Nutrition Note  Reason: Positive nutrition risk screen for unintentional weight loss and decreased appetite.   Taylor [name] is a 66 year old patient of Taylor [name], diagnosed with recurrent pyriform sinus. Attempted to contact patient via telephone for nutrition risk. Left patient voice mail with RD contact information.   Wt Readings from Last 10 Encounters:  12/10/11 103 lb 12.8 oz (47.083 kg)  08/06/11 107 lb 6.4 oz (48.716 kg)  07/19/11 105 lb (47.628 kg)  05/19/11 112 lb 14 oz (51.2 kg)  05/19/11 112 lb 14 oz (51.2 kg)  05/10/11 109 lb 5.6 oz (49.6 kg)  12/27/10 110 lb (49.896 kg)  11/20/07 132 lb (59.875 kg)  03/06/07 135 lb (61.236 kg)    RD available for nutrition needs.   Iven Finn [facility] [phone]

## 2012-01-22 ENCOUNTER — Other Ambulatory Visit (HOSPITAL_COMMUNITY): Payer: Self-pay | Admitting: Orthopedic Surgery

## 2012-01-22 DIAGNOSIS — M25551 Pain in right hip: Secondary | ICD-10-CM

## 2012-01-22 DIAGNOSIS — C50919 Malignant neoplasm of unspecified site of unspecified female breast: Secondary | ICD-10-CM

## 2012-01-24 ENCOUNTER — Telehealth: Payer: Self-pay | Admitting: *Deleted

## 2012-01-25 NOTE — Telephone Encounter (Signed)
Pt had called to report dr. Charlett Blake ordered a bone scan of her right hip but Radiology says they should do a whole body scan.  Pt states she feels only needs right hip done due to pain in that area only.  She voiced concern about being exposed to more radiation than necessary.  Asks for nurse and Dr. Lodema Pilot opinion.  Per Dr. Gaylyn Rong,  Bone scan includes the whole body and can't be limited to just the hip area.  If pt is worried she can ask Dr. Charlett Blake to do an MRI of right hip that will not expose pt to radiation.  Dr. Gaylyn Rong says he would defer to Dr. Charlett Blake..  Informed pt and she said she will proceed w/ bone scan as ordered.  She is hoping to get some relief of her hip pain.  Instructed her to call back w/ any new questions/concerns.

## 2012-01-28 ENCOUNTER — Ambulatory Visit (HOSPITAL_COMMUNITY)
Admission: RE | Admit: 2012-01-28 | Discharge: 2012-01-28 | Disposition: A | Discharge status: Home / Self Care | Payer: Medicare Other | Source: Ambulatory Visit | Attending: Orthopedic Surgery | Admitting: Orthopedic Surgery

## 2012-01-28 DIAGNOSIS — M25559 Pain in unspecified hip: Secondary | ICD-10-CM | POA: Insufficient documentation

## 2012-01-28 DIAGNOSIS — M25551 Pain in right hip: Secondary | ICD-10-CM

## 2012-01-28 DIAGNOSIS — C50919 Malignant neoplasm of unspecified site of unspecified female breast: Secondary | ICD-10-CM | POA: Insufficient documentation

## 2012-01-28 MED ORDER — TECHNETIUM TC 99M MEDRONATE IV KIT
25.0000 | PACK | Freq: Once | INTRAVENOUS | Status: AC | PRN
  Administered 2012-01-28: 25 via INTRAVENOUS

## 2012-02-11 ENCOUNTER — Other Ambulatory Visit (HOSPITAL_BASED_OUTPATIENT_CLINIC_OR_DEPARTMENT_OTHER): Payer: Medicare Other | Admitting: Lab

## 2012-02-11 ENCOUNTER — Other Ambulatory Visit: Payer: Self-pay | Admitting: Oncology

## 2012-02-11 DIAGNOSIS — E039 Hypothyroidism, unspecified: Secondary | ICD-10-CM

## 2012-02-14 ENCOUNTER — Telehealth: Payer: Self-pay

## 2012-02-15 NOTE — Progress Notes (Signed)
TSH lab results given to patient; verbalized understanding.

## 2012-03-26 ENCOUNTER — Other Ambulatory Visit: Payer: Self-pay | Admitting: Oncology

## 2012-03-27 ENCOUNTER — Telehealth: Payer: Self-pay | Admitting: *Deleted

## 2012-03-27 NOTE — Telephone Encounter (Signed)
Dr. Gaylyn Rong refilled ativan for nausea.  Refill called into [facility] on [address].

## 2012-04-02 ENCOUNTER — Telehealth: Payer: Self-pay

## 2012-04-02 NOTE — Telephone Encounter (Signed)
Message copied by Kallie Locks on Wed Apr 02, 2012  8:50 AM ------      Message from: HA, Raliegh Ip T      Created: Mon Mar 31, 2012 12:20 PM      Regarding: RE: "Post Bone Marrow Transplant"      Contact: [phone]       That's fine.   She is cleared for root canal.  She has not had chemo or bisphos for a while.             Thanks.       ----- Message -----         From: Marcell Barlow, RN         Sent: 03/31/2012  12:01 PM           To: Exie Parody, MD      Subject: "Post Bone Marrow Transplant"                            Is it okay if patient gets a root canal?            Taylor [name].

## 2012-05-05 ENCOUNTER — Other Ambulatory Visit: Payer: Self-pay | Admitting: Oncology

## 2012-05-12 ENCOUNTER — Other Ambulatory Visit (HOSPITAL_BASED_OUTPATIENT_CLINIC_OR_DEPARTMENT_OTHER): Payer: Medicare Other | Admitting: Lab

## 2012-05-12 ENCOUNTER — Ambulatory Visit (HOSPITAL_BASED_OUTPATIENT_CLINIC_OR_DEPARTMENT_OTHER): Payer: Medicare Other | Admitting: Oncology

## 2012-05-12 ENCOUNTER — Telehealth: Payer: Self-pay | Admitting: Oncology

## 2012-05-12 VITALS — BP 132/70 | HR 84 | Temp 98.2°F | Resp 20 | Ht 62.0 in | Wt 100.8 lb

## 2012-05-12 DIAGNOSIS — Z853 Personal history of malignant neoplasm of breast: Secondary | ICD-10-CM

## 2012-05-12 DIAGNOSIS — C12 Malignant neoplasm of pyriform sinus: Secondary | ICD-10-CM

## 2012-05-12 DIAGNOSIS — C140 Malignant neoplasm of pharynx, unspecified: Secondary | ICD-10-CM

## 2012-05-12 DIAGNOSIS — K219 Gastro-esophageal reflux disease without esophagitis: Secondary | ICD-10-CM

## 2012-05-12 DIAGNOSIS — R5381 Other malaise: Secondary | ICD-10-CM

## 2012-05-12 DIAGNOSIS — M25559 Pain in unspecified hip: Secondary | ICD-10-CM

## 2012-05-12 DIAGNOSIS — R5383 Other fatigue: Secondary | ICD-10-CM

## 2012-05-12 DIAGNOSIS — C50919 Malignant neoplasm of unspecified site of unspecified female breast: Secondary | ICD-10-CM

## 2012-05-12 DIAGNOSIS — M810 Age-related osteoporosis without current pathological fracture: Secondary | ICD-10-CM

## 2012-05-12 LAB — CBC WITH DIFFERENTIAL/PLATELET
Basophils Absolute: 0 10*3/uL (ref 0.0–0.1)
Eosinophils Absolute: 0.2 10*3/uL (ref 0.0–0.5)
HCT: 36.4 % (ref 34.8–46.6)
HGB: 12.6 g/dL (ref 11.6–15.9)
LYMPH%: 12.6 % — ABNORMAL LOW (ref 14.0–49.7)
MCV: 92.6 fL (ref 79.5–101.0)
MONO%: 11.3 % (ref 0.0–14.0)
NEUT#: 4.6 10*3/uL (ref 1.5–6.5)
Platelets: 403 10*3/uL — ABNORMAL HIGH (ref 145–400)

## 2012-05-12 LAB — COMPREHENSIVE METABOLIC PANEL (CC13)
CO2: 28 mEq/L (ref 22–29)
Creatinine: 0.6 mg/dL (ref 0.6–1.1)
Glucose: 111 mg/dl — ABNORMAL HIGH (ref 70–99)
Total Bilirubin: 0.53 mg/dL (ref 0.20–1.20)
Total Protein: 6.5 g/dL (ref 6.4–8.3)

## 2012-05-12 MED ORDER — OMEPRAZOLE 20 MG PO CPDR: 20.0000 mg | DELAYED_RELEASE_CAPSULE | Freq: Every day | ORAL | Status: DC

## 2012-05-12 MED ORDER — PROMETHAZINE HCL 25 MG RE SUPP: 25.0000 mg | Freq: Four times a day (QID) | RECTAL | Status: DC | PRN

## 2012-05-12 MED ORDER — LORAZEPAM 0.5 MG PO TABS: 0.5000 mg | ORAL_TABLET | Freq: Three times a day (TID) | ORAL | Status: DC

## 2012-05-12 MED ORDER — ONDANSETRON HCL 8 MG PO TABS: 8.0000 mg | ORAL_TABLET | Freq: Three times a day (TID) | ORAL | Status: DC | PRN

## 2012-05-12 MED ORDER — DRONABINOL 2.5 MG PO CAPS: 2.5000 mg | ORAL_CAPSULE | Freq: Two times a day (BID) | ORAL | Status: DC

## 2012-05-12 NOTE — Telephone Encounter (Signed)
gv and printed pt appt schedule for April 2014 ° °

## 2012-05-13 ENCOUNTER — Encounter: Payer: Self-pay | Admitting: Oncology

## 2012-05-13 NOTE — Progress Notes (Signed)
[facility]  Telephone:(336) [phone] Fax:(336) [phone]   OFFICE PROGRESS NOTE   Cc:  [name], MD  DIAGNOSIS; PAST AND CURRENT THERAPY:    1. 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 She received concurrent chemoradiation therapy with weekly cisplatin, which started 04/18/2009 and ended 06/06/2009. Treatment dates were from April 18, 2009 through June 13, 2009. She had local recurrence and underwent on 05/16/2011 modified level IV left neck dissection with positive margin and gross muscle invasion.  2. 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 She was treated with three cycles of Cytoxan, Taxotere and could not finish all six planned cycles secondary to severe myalgia/arthralgia. She received adjuvant radiation therapy and was started on Femara from which she was nonadherent until she was diagnosed with piriform sinus SCC. She is now again on adjuvant Femara since 2011.   INTERVAL HISTORY: Taylor [name] 66 y.o. female returns for regular follow up with her partner.  She reported that she has had tight left neck from chemoradiation and neck dissection. She still has problem with eating dry/large food bolus.  She has had further weight loss.  She still smoke cigarettes.  Drinks about 10 beers/day. She still has nausea/vomiting with eating.  She has been taking Zofran and Ativan which have helped her, but still wants more medication for her nausea.  She denied palpable neck mass. States she stopped taking her AI as she has incurable HNSCC.  She denies back pain, bowel/bladder incontience, paresthesia.  She has mild fatigue; however, she is still independent of activities of daily living.  Her partner leaves for work several times a weekly; and patient is able  to take care of herself.   Patient denies headache, visual changes, confusion, drenching night sweats, palpable lymph node swelling, mucositis, jaundice, chest pain, palpitation, shortness of breath, dyspnea on exertion, productive cough, gum bleeding, epistaxis, hematemesis, hemoptysis, abdominal pain, abdominal swelling, early satiety, melena, hematochezia, hematuria, skin rash, spontaneous bleeding, joint swelling, heat or cold intolerance, bowel bladder incontinence, back pain, focal motor weakness, paresthesia.  She thinks that her mood is good.  She denies depression, suicidal thoughts.    Past Medical History  Diagnosis Date  . Hyperlipidemia     STATES SHE NO LONGER HAS HI CHOLESTEROL  . Hypertension   . Headache   . Rhinitis   . Throat cancer 2010  . Breast CA 2008  . Fatigue 12/10/2011  . Hypothyroid 12/11/2011    Past Surgical History  Procedure Date  . Breast lumpectomy   . Appendectomy   . Panendoscopy 05/16/2011    Procedure: PANENDOSCOPY;  Surgeon: Cephus Richer;  Location: [facility];  Service: ENT;  Laterality: N/A;  Direct laryngoscopy, esophagoscopy  . Radical neck dissection 05/16/2011    Procedure: RADICAL NECK DISSECTION;  Surgeon: Cephus Richer;  Location: [facility] OR;  Service: ENT;  Laterality: Left;  Modified radical left neck dissection     Current Outpatient Prescriptions  Medication Sig Dispense Refill  . acetaminophen (TYLENOL) 325 MG tablet Take 650 mg by mouth every 6 (six) hours as needed.      . citalopram (CELEXA) 40 MG tablet Take 40 mg by mouth daily.        . cyclobenzaprine (FLEXERIL) 10 MG tablet Take 10 mg by mouth at bedtime as needed.  For sleep Refill 09/13/2011 faxed to [facility].      Marland Kitchen desoximetasone (TOPICORT) 0.25 % cream Apply 1 application topically 2 (two) times daily. Apply to affected area       . diclofenac (VOLTAREN) 75 MG EC tablet Take 75 mg by mouth 2 (two) times daily.       Marland Kitchen dronabinol (MARINOL) 2.5 MG capsule Take 1 capsule  (2.5 mg total) by mouth 2 (two) times daily before a meal.  60 capsule  3  . levothyroxine (SYNTHROID) 50 MCG tablet Take 1 tablet (50 mcg total) by mouth daily.  30 tablet  5  . LORazepam (ATIVAN) 0.5 MG tablet Take 1-2 tablets (0.5-1 mg total) by mouth every 8 (eight) hours.  90 tablet  3  . omeprazole (PRILOSEC) 20 MG capsule Take 1 capsule (20 mg total) by mouth daily.  90 capsule  3  . ondansetron (ZOFRAN) 8 MG tablet Take 1 tablet (8 mg total) by mouth every 8 (eight) hours as needed for nausea.  90 tablet  3  . promethazine (PHENERGAN) 25 MG suppository Place 1 suppository (25 mg total) rectally every 6 (six) hours as needed for nausea.  12 each  3  . pseudoephedrine (SUDAFED) 30 MG tablet Take 30 mg by mouth every 4 (four) hours as needed. For allergies         ALLERGIES:  is allergic to ace inhibitors; hydrocodone; hydromorphone; tylox; and sulfa antibiotics.  REVIEW OF SYSTEMS:  The rest of the 14-point review of system was negative.   Filed Vitals:   05/12/12 1009  BP: 132/70  Pulse: 84  Temp: 98.2 F (36.8 C)  Resp: 20   Wt Readings from Last 3 Encounters:  05/12/12 100 lb 12.8 oz (45.723 kg)  12/10/11 103 lb 12.8 oz (47.083 kg)  08/06/11 107 lb 6.4 oz (48.716 kg)   ECOG Performance status: 1-2  PHYSICAL EXAMINATION:   General: Thin-appearing, cachectic,  woman in no acute distress. Eyes: no scleral icterus. ENT: There were no oropharyngeal lesions. Neck was without thyromegaly. Left neck resection scar was fibrotic with mild erythema without palpable node/mass/open wound/ pain on palpation. Lymphatics: Negative cervical, supraclavicular or axillary adenopathy. Respiratory: lungs were clear bilaterally without wheezing or crackles. Cardiovascular: Regular rate and rhythm, S1/S2, without murmur, rub or gallop. There was no pedal edema. GI: abdomen was soft, flat, nontender, nondistended, without organomegaly. Muscoloskeletal: no spinal tenderness of palpation of vertebral  spine.  She had pain in the right lateral hip with abd/adduct/ flexion/extenstion of the right hip.  Skin exam was without echymosis, petichae. Neuro exam was nonfocal. Patient was able to get on and off exam table without assistance. Gait was normal. Patient was alerted and oriented. Attention was good. Language was appropriate. Mood was normal without depression. Speech was not pressured. Thought content was not tangential. Bilateral breast exam was negative for palpable breast mass, nipple discharge, skin thickening or erythema.     LABORATORY/RADIOLOGY DATA:  Lab Results  Component Value Date   WBC 6.3 05/12/2012   HGB 12.6 05/12/2012   HCT 36.4 05/12/2012   PLT 403* 05/12/2012   GLUCOSE 111* 05/12/2012   CHOL 322* 12/27/2010   TRIG 83.0 12/27/2010   HDL 168.00 12/27/2010   LDLDIRECT 107.5 12/27/2010   ALKPHOS 104 05/12/2012   ALT 22 05/12/2012   AST 26 05/12/2012   NA 131* 05/12/2012   K 4.6 05/12/2012   CL 98 05/12/2012   CREATININE 0.6 05/12/2012   BUN 9.0 05/12/2012  CO2 28 05/12/2012   INR 0.9 03/09/2009     ASSESSMENT AND PLAN:   1. History of oropharynx squamous cell carcinoma:  She has recurrent disease.  She is s/p left neck dissection with positive margin.  She is not interested in further resection or chemotherapy.  I agreed with her decision since she has had a difficult time with the original chemoradiation.  She still smokes cigarettes  and is not willing to quit.  With her borderline performance status, I'm not sure if she will be able to withstand aggressive resection or palliative chemotherapy.  There is no reason to obtain surveillance neck CT with incurable disease.  2. History of breast cancer: She has stopped letrozole on her own. Given her incurable HNSCC, I did not encourage her to restart. 3. Hot flashes: She has good symptom contro with citalopram 40 mg p.o. daily.  4. Surveillance of breast cancer: her last mammogram in 08/2011 was negative.  Given her  incurable HNSCC, I query whether breast mammogram is appropriate.  She is leaning towards no further mammogram, but will delay the final decision until her next visit. 5. Esophageal stricture and GERD: She is following with Dr. Matthias Hughs and underwent a dilatation n in 2012.  She may need more dilation if she has worsening dysphagia.  Her dysphagia could also be due to her recurrent HNSCC.  6. Hypertension: diet control only due to her weight loss.   7. Smoking: still smoking now.  She does not want to quit due to presence of incurable HNSCC.  8. Osteoporosis on last DEXA 08/2009:  Not pertinent due to incurable HNSCC.   9. Right hip pain:  Most likely DJD or OA.  She is seeing Ortho for treatment.  Her pain is rather limited.  If it worsens and more diffuse, I may consider bone scan to rule out bone met.  I still have low suspicion for bone met at this time.  10. Nausea: Patient thinks nausea could be due to reflux. I have encouraged her to restart Omeprazole. I have refilled her Zofran and Ativan today. I have also given her a prescription for Phenergan suppositories.  11. Protein calorie malnutrition: Continue Ensure supplements as tolerated. Will try Marinol for appetite stimulation and this may also help with nausea.  12. Follow up:  In about 4-5 months.  13. Code status:  I discussed with Ms. Alcalde the incurable head/neck cancer.  She told me that she had previously discussed with her partner and would like to be DNR/DNI in case she has cardiopulmonary arrest.  I agree with this decision.  14. Dispo:  We discussed that in the future, if and when she becomes more debilitated from her disease, we will consider hospice referral.  She wanted to delay this referral for now since she is still functional.     The length of time of the face-to-face encounter was 25 minutes. More than 50% of time was spent counseling and coordination of care.

## 2012-08-04 ENCOUNTER — Telehealth: Payer: Self-pay | Admitting: *Deleted

## 2012-08-04 ENCOUNTER — Other Ambulatory Visit: Payer: Self-pay | Admitting: Oncology

## 2012-08-04 NOTE — Telephone Encounter (Signed)
Pt left VM requests refill on Levothyroxine 50 mcg daily.  Asks if she is supposed to have her thyroid level checked again soon or can wait until seen again in April?

## 2012-08-05 ENCOUNTER — Other Ambulatory Visit: Payer: Self-pay | Admitting: Oncology

## 2012-08-05 NOTE — Telephone Encounter (Signed)
We can wait until 09/2012 since her last two levels were within normal range.  Thanks.

## 2012-08-06 NOTE — Telephone Encounter (Signed)
Called pt back and s/w Okey Regal.  Informed her of synthroid refilled and no need for Lab until next appt in April.  She verbalized understanding and confirmed pt's next appt.Marland Kitchen

## 2012-09-06 ENCOUNTER — Other Ambulatory Visit: Payer: Self-pay | Admitting: Oncology

## 2012-09-07 ENCOUNTER — Other Ambulatory Visit: Payer: Self-pay | Admitting: Oncology

## 2012-10-08 ENCOUNTER — Ambulatory Visit (HOSPITAL_BASED_OUTPATIENT_CLINIC_OR_DEPARTMENT_OTHER): Payer: Medicare Other | Admitting: Oncology

## 2012-10-08 ENCOUNTER — Telehealth: Payer: Self-pay | Admitting: Oncology

## 2012-10-08 ENCOUNTER — Other Ambulatory Visit (HOSPITAL_BASED_OUTPATIENT_CLINIC_OR_DEPARTMENT_OTHER): Payer: Medicare Other | Admitting: Lab

## 2012-10-08 VITALS — BP 161/82 | HR 77 | Temp 97.2°F | Resp 18 | Ht 62.0 in | Wt 96.9 lb

## 2012-10-08 DIAGNOSIS — C140 Malignant neoplasm of pharynx, unspecified: Secondary | ICD-10-CM

## 2012-10-08 DIAGNOSIS — Z853 Personal history of malignant neoplasm of breast: Secondary | ICD-10-CM

## 2012-10-08 DIAGNOSIS — K219 Gastro-esophageal reflux disease without esophagitis: Secondary | ICD-10-CM

## 2012-10-08 DIAGNOSIS — C12 Malignant neoplasm of pyriform sinus: Secondary | ICD-10-CM

## 2012-10-08 DIAGNOSIS — M25559 Pain in unspecified hip: Secondary | ICD-10-CM

## 2012-10-08 LAB — COMPREHENSIVE METABOLIC PANEL (CC13)
Albumin: 2.8 g/dL — ABNORMAL LOW (ref 3.5–5.0)
CO2: 27 mEq/L (ref 22–29)
Calcium: 9.2 mg/dL (ref 8.4–10.4)
Chloride: 96 mEq/L — ABNORMAL LOW (ref 98–107)
Glucose: 112 mg/dl — ABNORMAL HIGH (ref 70–99)
Sodium: 130 mEq/L — ABNORMAL LOW (ref 136–145)
Total Bilirubin: 0.37 mg/dL (ref 0.20–1.20)
Total Protein: 6.1 g/dL — ABNORMAL LOW (ref 6.4–8.3)

## 2012-10-08 LAB — CBC WITH DIFFERENTIAL/PLATELET
Eosinophils Absolute: 0.2 10*3/uL (ref 0.0–0.5)
HCT: 36 % (ref 34.8–46.6)
LYMPH%: 11.7 % — ABNORMAL LOW (ref 14.0–49.7)
MONO#: 1 10*3/uL — ABNORMAL HIGH (ref 0.1–0.9)
NEUT#: 5.2 10*3/uL (ref 1.5–6.5)
Platelets: 489 10*3/uL — ABNORMAL HIGH (ref 145–400)
RBC: 3.9 10*6/uL (ref 3.70–5.45)
WBC: 7.3 10*3/uL (ref 3.9–10.3)
lymph#: 0.9 10*3/uL (ref 0.9–3.3)

## 2012-10-08 LAB — TSH: TSH: 3.918 u[IU]/mL (ref 0.350–4.500)

## 2012-10-08 NOTE — Progress Notes (Signed)
[facility]  Telephone:(336) [phone] Fax:(336) [phone]   OFFICE PROGRESS NOTE   Cc:  [name], MD  DIAGNOSIS; PAST AND CURRENT THERAPY:    1. 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 She received concurrent chemoradiation therapy with weekly cisplatin, which started 04/18/2009 and ended 06/06/2009. Treatment dates were from April 18, 2009 through June 13, 2009. She had local recurrence and underwent on 05/16/2011 modified level IV left neck dissection with positive margin and gross muscle invasion.  2. 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 She was treated with three cycles of Cytoxan, Taxotere and could not finish all six planned cycles secondary to severe myalgia/arthralgia. She received adjuvant radiation therapy and was started on Femara from which she was nonadherent until she was diagnosed with piriform sinus SCC. She is now again on adjuvant Femara since 2011.   INTERVAL HISTORY: Taylor [name] 67 y.o. female returns for regular follow up with her friend Okey Regal.  She reported that she has been having diffuse lower abdominal cramp.  It's worst in the morning a few hours after breakfast which is her biggest meal of the day.  She snacks lightly for lunch and dinner.  The cramp is moderate, feels gaseous, bloating, improves as the day progresses.  She denied need for pain med for abdominal cramp.  She has constipation which she is taking stool softener for.  She denied hematochezia, melena, nausea/vomiting.  She also has lower esophageal dysphagia more to solid than liquid. She has right hip pain that has been going on for years.  She has mild fatigue; however, she is still independent of all activities of daily living including grocery shopping and  cooking. She denied any breast mass.      Past Medical History  Diagnosis Date  . Hyperlipidemia     STATES SHE NO LONGER HAS HI CHOLESTEROL  . Hypertension   . Headache   . Rhinitis   . Throat cancer 2010  . Breast CA 2008  . Fatigue 12/10/2011  . Hypothyroid 12/11/2011    Past Surgical History  Procedure Laterality Date  . Breast lumpectomy    . Appendectomy    . Panendoscopy  05/16/2011    Procedure: PANENDOSCOPY;  Surgeon: Cephus Richer;  Location: [facility] OR;  Service: ENT;  Laterality: N/A;  Direct laryngoscopy, esophagoscopy  . Radical neck dissection  05/16/2011    Procedure: RADICAL NECK DISSECTION;  Surgeon: Cephus Richer;  Location: [facility] OR;  Service: ENT;  Laterality: Left;  Modified radical left neck dissection     Current Outpatient Prescriptions  Medication Sig Dispense Refill  . acetaminophen (TYLENOL) 325 MG tablet Take 650 mg by mouth every 6 (six) hours as needed.      . citalopram (CELEXA) 40 MG tablet Take 40 mg by mouth daily.        . cyclobenzaprine (FLEXERIL) 10 MG tablet Take 10 mg by mouth at bedtime as needed. For sleep Refill 09/13/2011 [facility] to [facility].      Marland Kitchen desoximetasone (TOPICORT) 0.25 % cream Apply 1 application topically 2 (two) times daily. Apply to affected area       . diclofenac (VOLTAREN) 75 MG EC tablet Take 75 mg by mouth 2 (two) times daily.       Marland Kitchen levothyroxine (SYNTHROID, LEVOTHROID) 50  MCG tablet TAKE 1 TABLET (50 MCG TOTAL) BY MOUTH DAILY.  30 tablet  5  . LORazepam (ATIVAN) 0.5 MG tablet Take 1-2 tablets (0.5-1 mg total) by mouth every 8 (eight) hours.  90 tablet  3  . omeprazole (PRILOSEC) 20 MG capsule Take 1 capsule (20 mg total) by mouth daily.  90 capsule  3  . ondansetron (ZOFRAN) 8 MG tablet Take 1 tablet (8 mg total) by mouth every 8 (eight) hours as needed for nausea.  90 tablet  3  . pseudoephedrine (SUDAFED) 30 MG tablet Take 30 mg by mouth every 4 (four) hours as needed. For allergies       . dronabinol (MARINOL)  2.5 MG capsule Take 1 capsule (2.5 mg total) by mouth 2 (two) times daily before a meal.  60 capsule  3  . promethazine (PHENERGAN) 25 MG suppository Place 1 suppository (25 mg total) rectally every 6 (six) hours as needed for nausea.  12 each  3   No current facility-administered medications for this visit.    ALLERGIES:  is allergic to ace inhibitors; hydrocodone; hydromorphone; tylox; and sulfa antibiotics.  REVIEW OF SYSTEMS:  The rest of the 14-point review of system was negative.   Filed Vitals:   10/08/12 0926  BP: 161/82  Pulse: 77  Temp: 97.2 F (36.2 C)  Resp: 18   Wt Readings from Last 3 Encounters:  10/08/12 96 lb 14.4 oz (43.954 kg)  05/12/12 100 lb 12.8 oz (45.723 kg)  12/10/11 103 lb 12.8 oz (47.083 kg)   ECOG Performance status: 1-2  PHYSICAL EXAMINATION:   General: Thin-appearing, cachectic,  woman in no acute distress. Eyes: no scleral icterus. ENT: There were no oropharyngeal lesions. Neck was without thyromegaly. Left neck resection scar was fibrotic with mild erythema without palpable node/mass/open wound/ pain on palpation. Lymphatics: Negative cervical, supraclavicular or axillary adenopathy. Respiratory: lungs were clear bilaterally without wheezing or crackles. Cardiovascular: Regular rate and rhythm, S1/S2, without murmur, rub or gallop. There was no pedal edema. GI: abdomen was soft, flat, nontender, nondistended, without organomegaly. Muscoloskeletal: no spinal tenderness of palpation of vertebral spine.  She had pain in the right lateral hip.  Skin exam was without echymosis, petichae. Neuro exam was nonfocal. Patient was able to get on and off exam table without assistance. Gait was normal. Patient was alerted and oriented. Attention was good. Language was appropriate. Mood was normal without depression. Speech was not pressured. Thought content was not tangential. Bilateral breast exam was negative for palpable breast mass, nipple discharge, skin thickening  or erythema.     LABORATORY/RADIOLOGY DATA:  Lab Results  Component Value Date   WBC 7.3 10/08/2012   HGB 12.5 10/08/2012   HCT 36.0 10/08/2012   PLT 489* 10/08/2012   GLUCOSE 112* 10/08/2012   CHOL 322* 12/27/2010   TRIG 83.0 12/27/2010   HDL 168.00 12/27/2010   LDLDIRECT 107.5 12/27/2010   ALKPHOS 171* 10/08/2012   ALT 22 10/08/2012   AST 28 10/08/2012   NA 130* 10/08/2012   K 4.5 10/08/2012   CL 96* 10/08/2012   CREATININE 0.6 10/08/2012   BUN 9.4 10/08/2012   CO2 27 10/08/2012   INR 0.9 03/09/2009     ASSESSMENT AND PLAN:   1. History of oropharynx squamous cell carcinoma:  She has recurrent disease.  She is not a candidate for repeat treatment.  She still wants to obsevere. 2. History of breast cancer: She had stopped Letrozole due to hot flash and presence of  incurable head/neck cancer.  No surveillance breast mammogram is indicated in this situation.  3. Abdominal cramp:  Intermittent, related to eating.  I advised her to try Simethicone or Gas-X.  If abdominal pain persists or significantly worsened, we may consider CT scan.  4. Esophageal stricture and GERD: She is following with Dr. Matthias Hughs and underwent a dilatation in 2012.  She did not think that she had much relief then.  However, if worsens, I advised her to give it another try.  5. Right hip pain:  Most likely DJD or OA.  She is seeing Ortho for treatment. She is on injection prn.  6. Follow up:  In about 5 months.  7. Code status:  DNR/DNI as previously discussed.     The length of time of the face-to-face encounter was 25 minutes. More than 50% of time was spent counseling and coordination of care.

## 2012-12-01 ENCOUNTER — Other Ambulatory Visit: Payer: Self-pay | Admitting: Oncology

## 2012-12-04 ENCOUNTER — Other Ambulatory Visit: Payer: Self-pay | Admitting: *Deleted

## 2012-12-04 DIAGNOSIS — C140 Malignant neoplasm of pharynx, unspecified: Secondary | ICD-10-CM

## 2012-12-04 DIAGNOSIS — K219 Gastro-esophageal reflux disease without esophagitis: Secondary | ICD-10-CM

## 2012-12-04 MED ORDER — LORAZEPAM 0.5 MG PO TABS: 0.5000 mg | ORAL_TABLET | Freq: Three times a day (TID) | ORAL | Status: DC | PRN

## 2013-01-12 ENCOUNTER — Ambulatory Visit
Admission: RE | Admit: 2013-01-12 | Discharge: 2013-01-12 | Disposition: A | Discharge status: Home / Self Care | Payer: Medicare Other | Source: Ambulatory Visit | Attending: Gastroenterology | Admitting: Gastroenterology

## 2013-01-12 ENCOUNTER — Encounter (HOSPITAL_COMMUNITY): Payer: Self-pay | Admitting: Pharmacy Technician

## 2013-01-12 ENCOUNTER — Encounter (HOSPITAL_COMMUNITY): Payer: Self-pay | Admitting: *Deleted

## 2013-01-12 ENCOUNTER — Other Ambulatory Visit: Payer: Self-pay | Admitting: Gastroenterology

## 2013-01-12 DIAGNOSIS — R1310 Dysphagia, unspecified: Secondary | ICD-10-CM

## 2013-01-13 ENCOUNTER — Encounter (HOSPITAL_COMMUNITY): Payer: Self-pay | Admitting: Anesthesiology

## 2013-01-13 ENCOUNTER — Encounter (HOSPITAL_COMMUNITY)
Admission: RE | Disposition: A | Discharge status: Home / Self Care | Payer: Self-pay | Source: Ambulatory Visit | Attending: Gastroenterology

## 2013-01-13 ENCOUNTER — Ambulatory Visit (HOSPITAL_COMMUNITY): Payer: Medicare Other | Admitting: Anesthesiology

## 2013-01-13 ENCOUNTER — Ambulatory Visit (HOSPITAL_COMMUNITY)
Admission: RE | Admit: 2013-01-13 | Discharge: 2013-01-13 | Disposition: A | Discharge status: Home / Self Care | Payer: Medicare Other | Source: Ambulatory Visit | Attending: Gastroenterology | Admitting: Gastroenterology

## 2013-01-13 ENCOUNTER — Encounter (HOSPITAL_COMMUNITY): Payer: Self-pay | Admitting: *Deleted

## 2013-01-13 DIAGNOSIS — Z923 Personal history of irradiation: Secondary | ICD-10-CM | POA: Insufficient documentation

## 2013-01-13 DIAGNOSIS — R000 Tachycardia, unspecified: Secondary | ICD-10-CM | POA: Insufficient documentation

## 2013-01-13 DIAGNOSIS — E039 Hypothyroidism, unspecified: Secondary | ICD-10-CM | POA: Insufficient documentation

## 2013-01-13 DIAGNOSIS — F17200 Nicotine dependence, unspecified, uncomplicated: Secondary | ICD-10-CM | POA: Insufficient documentation

## 2013-01-13 DIAGNOSIS — E785 Hyperlipidemia, unspecified: Secondary | ICD-10-CM | POA: Insufficient documentation

## 2013-01-13 DIAGNOSIS — R0902 Hypoxemia: Secondary | ICD-10-CM | POA: Insufficient documentation

## 2013-01-13 DIAGNOSIS — C50919 Malignant neoplasm of unspecified site of unspecified female breast: Secondary | ICD-10-CM

## 2013-01-13 DIAGNOSIS — J988 Other specified respiratory disorders: Secondary | ICD-10-CM | POA: Insufficient documentation

## 2013-01-13 DIAGNOSIS — C140 Malignant neoplasm of pharynx, unspecified: Secondary | ICD-10-CM | POA: Insufficient documentation

## 2013-01-13 DIAGNOSIS — Z79899 Other long term (current) drug therapy: Secondary | ICD-10-CM | POA: Insufficient documentation

## 2013-01-13 DIAGNOSIS — K222 Esophageal obstruction: Secondary | ICD-10-CM | POA: Insufficient documentation

## 2013-01-13 DIAGNOSIS — J385 Laryngeal spasm: Secondary | ICD-10-CM | POA: Insufficient documentation

## 2013-01-13 DIAGNOSIS — J4489 Other specified chronic obstructive pulmonary disease: Secondary | ICD-10-CM | POA: Insufficient documentation

## 2013-01-13 DIAGNOSIS — Z853 Personal history of malignant neoplasm of breast: Secondary | ICD-10-CM | POA: Insufficient documentation

## 2013-01-13 DIAGNOSIS — R61 Generalized hyperhidrosis: Secondary | ICD-10-CM | POA: Insufficient documentation

## 2013-01-13 DIAGNOSIS — I10 Essential (primary) hypertension: Secondary | ICD-10-CM

## 2013-01-13 DIAGNOSIS — J449 Chronic obstructive pulmonary disease, unspecified: Secondary | ICD-10-CM | POA: Insufficient documentation

## 2013-01-13 DIAGNOSIS — K219 Gastro-esophageal reflux disease without esophagitis: Secondary | ICD-10-CM | POA: Insufficient documentation

## 2013-01-13 HISTORY — PX: BALLOON DILATION: SHX5330

## 2013-01-13 HISTORY — PX: ESOPHAGOGASTRODUODENOSCOPY: SHX5428

## 2013-01-13 SURGERY — EGD (ESOPHAGOGASTRODUODENOSCOPY)
Anesthesia: Monitor Anesthesia Care

## 2013-01-13 MED ORDER — PROMETHAZINE HCL 25 MG/ML IJ SOLN: 6.2500 mg | INTRAMUSCULAR | Status: DC | PRN

## 2013-01-13 MED ORDER — METOPROLOL TARTRATE 1 MG/ML IV SOLN
INTRAVENOUS | Status: DC | PRN
  Administered 2013-01-13: 5 mg via INTRAVENOUS

## 2013-01-13 MED ORDER — PROPOFOL 10 MG/ML IV EMUL
INTRAVENOUS | Status: DC | PRN
  Administered 2013-01-13 (×3): 50 mg via INTRAVENOUS

## 2013-01-13 MED ORDER — LACTATED RINGERS IV SOLN
INTRAVENOUS | Status: DC
  Administered 2013-01-13: 09:00:00 via INTRAVENOUS

## 2013-01-13 MED ORDER — LIDOCAINE HCL (CARDIAC) 20 MG/ML IV SOLN
INTRAVENOUS | Status: DC | PRN
  Administered 2013-01-13: 50 mg via INTRAVENOUS

## 2013-01-13 MED ORDER — LACTATED RINGERS IV SOLN
INTRAVENOUS | Status: DC | PRN
  Administered 2013-01-13: 07:00:00 via INTRAVENOUS

## 2013-01-13 MED ORDER — SODIUM CHLORIDE 0.9 % IV SOLN: INTRAVENOUS | Status: DC

## 2013-01-13 MED ORDER — SUCCINYLCHOLINE CHLORIDE 20 MG/ML IJ SOLN
INTRAMUSCULAR | Status: DC | PRN
  Administered 2013-01-13: 50 mg via INTRAVENOUS
  Administered 2013-01-13: 100 mg via INTRAVENOUS

## 2013-01-13 NOTE — Progress Notes (Signed)
Troponin 1 drawn by lab.

## 2013-01-13 NOTE — Anesthesia Postprocedure Evaluation (Signed)
  Anesthesia Post-op Note  Patient: Taylor [name]  Procedure(s) Performed: Procedure(s): ESOPHAGOGASTRODUODENOSCOPY (EGD) (N/A) BALLOON DILATION (N/A)  Patient Location: PACU  Anesthesia Type:MAC  Level of Consciousness: awake, alert , oriented and patient cooperative  Airway and Oxygen Therapy: Patient Spontanous Breathing and Patient connected to nasal cannula oxygen  Post-op Pain: none  Post-op Assessment: Post-op Vital signs reviewed  Post-op Vital Signs: stable  Complications: respiratory complications

## 2013-01-13 NOTE — Op Note (Signed)
[facility] [address] [place], [address]   ENDOSCOPY PROCEDURE REPORT  PATIENT: Taylor, [name]  MR#: [id number] BIRTHDATE: 19-Mar-1946 , 67  yrs. old GENDER: Female ENDOSCOPIST:Radley Teston, MD REFERRED BY:  Dr. Fabian Sharp PROCEDURE DATE:  01/13/2013 PROCEDURE:      Upper endoscopy with through-the-scope balloon dilatation of the esophagus ASA CLASS: INDICATIONS:   recurrent dysphagia symptoms in a patient with a prior history of an esophageal ring, dilated 2 years ago MEDICATION:    MAC per anesthesia TOPICAL ANESTHETIC:  DESCRIPTION OF PROCEDURE:   the patient came as an outpatient to the [facility] long endoscopy unit and provided written consent. After appropriate time out, she was sedated. When the Pentax adult video endoscope was placed in her mouth, however, she underwent severe desaturation and the scope was immediately removed and the patient was bagged by the anesthetist until her oxygen saturation improved.  The endoscope was then reinserted and entered the esophagus without undue difficulty. As was noted 2 years ago, there was a tight esophageal ring at the squamocolumnar junction, through which the 10 mm endoscope could not pass. The mucosa of the esophagus was unremarkable.  I proceeded to esophageal dilatation by through-the-scope balloon technique, first to 10 mm and then to 12 mm. During this time, the patient again desaturated. The inflation time for each balloon setting was therefore abbreviated to about 30 seconds. After deflating the second balloon, the scope was able to be passed through the area of the ring into the stomach and briefly into the duodenum and the scope was then promptly removed from the patient. There was mucosal disruption at the level of the ring, consistent with fracture of the ring.  The patient remained hypoxemic for a period of time and management was per the anesthesia team, including administration  of succinylcholine to combat presumed laryngospasm and ongoing bagging. The anesthesiologist came in and did not feel that the patient was a good candidate for intubation based on her anatomy, so prolonged bagging was performed until the medications were off sufficiently that the patient was breathing on her own, a period of approximately 30 minutes.  The patient was therefore taken to the PACU rather than the endoscopy recovery room for observation. She was tachycardic, hypertensive, and diaphoretic following the procedure so she will be ruled out for an MI and observed today. She was responsive, breathing on her own, and moving all extremities when she left the endoscopy unit.     COMPLICATIONS: severe hypoxemia, as noted above  ENDOSCOPIC IMPRESSION:  1. 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 Severe pulmonary instability during procedure  RECOMMENDATIONS:  1. Clinical followup of dysphagia symptoms 2. The patient needs further evaluation for the silent aspiration noted during her recent barium swallow 3. Consideration for elective fiberoptic intubation prior to any subsequent endoscopic procedures      _______________________________ eSigned:  Bernette Redbird, MD 01/13/2013 9:08 AM    PATIENT NAME:  Taylor, [name] MR#: [id number]

## 2013-01-13 NOTE — H&P (Signed)
  Pleasant 67 year old female comes in to the endoscopy unit as an outpatient for esophageal dysphagia, because of a roughly 1 month history of recurrent dysphagia symptoms.  The patient is 2 years status post dilatation of Schatzki's ring to 15 mm. The ring was very tight at that time.  Of interest, the patient has a history of throat cancer and is status post radiation treatment for that. However, her x-ray back them, and her barium swallow yesterday, do not show any impediment to swallowing in that region, such as a radiation-induced stricture. Rather, the hangup is entirely at the GE junction.  Past medical history:  Allergies: Sulfa, hydrocodone and hydromorphone (itching), Tylox (itching)  Outpatient medications: Sudafed, lorazepam, Synthroid, Voltaren, Vicodin, Zofran. Is supposed to be on Prilosec but has not been taking it for the past month due to dysphagia symptoms.  Operations appendectomy, lumpectomy  Chronic medical illnesses: History of throat cancer, history of breast cancer, hypertension, hyperlipoidemia, history of recurrent esophageal stricturing  Habits: Pertinent for 12 beers per day. I believe she also still smokes.  Physical exam: A very thin, pleasant, articulate Caucasian female in no distress. Anicteric and without pallor. Oropharynx benign. Negative for status post radiation treatment. Chest clear to auscultation. Heart without murmur or arrhythmia. Abdomen soft and nontender, nondistended.  Impression: Radiographically documented recurrent distal esophageal stricture (ring), with associated dysphagia symptoms of one month's duration  Plan: Upper endoscopy with repeat through-the-scope balloon dilatation, as was performed 2 years ago, given her marked symptomatic relief thereafter until recently  Taylor [name], M.D. [phone]

## 2013-01-13 NOTE — Progress Notes (Signed)
STAT paged to endoscopy for patient with severe airway obstruction/laryngospasm. Patient being masked ventilated with 100% FiO2 per face mask upon my arrival, with SaO2 in the upper 20's. Patient had received a total of 200mg  Succinylcholine IV in attempt to break laryngospasm and secure airway prior to my arrival.  Visualization of oropharynx with Glide Scope revealed Grade 3 airway with apparent edema of oropharynx. No attempt was made to intubate patient at this time due to inadequate visualization of larynx and acceptable airway per face mask. Patients SaO2 gradually improved to 98% over the next few minutes and was sustained with spontaneous respirations until discharge to PACU. Will obtain EKG and cardiac enzymes in PACU. Dr. Marjorie Smolder aware and will admit patient if deemed necessary by cardiac enzymes.

## 2013-01-13 NOTE — Progress Notes (Signed)
Dr. Rica Mast saw Troponin 1 results.- O.K. TO GO BACK TO ENDO. FOR CARDIOLOGIST TO SEE.

## 2013-01-13 NOTE — Progress Notes (Signed)
Dr. Rica Mast in- saw EKG COPY- ORDERS GIVEN.

## 2013-01-13 NOTE — Progress Notes (Signed)
Cardiac Troponin negative. Will discharge patient back to endoscopy while awaiting cardiac evaluation prior to discharge.

## 2013-01-13 NOTE — Anesthesia Preprocedure Evaluation (Signed)
Anesthesia Evaluation  Patient identified by MRN, date of birth, ID band Patient awake    Reviewed: Allergy & Precautions, H&P , NPO status , Patient's Chart, lab work & pertinent test results  Airway Mallampati: II TM Distance: >3 FB Neck ROM: Full    Dental  (+) Teeth Intact, Dental Advisory Given and Poor Dentition   Pulmonary Current Smoker,  Left radical neck with radiation therapy + rhonchi         Cardiovascular hypertension, Pt. on medications Rhythm:Regular Rate:Normal     Neuro/Psych  Headaches, Anxiety    GI/Hepatic Neg liver ROS, (+)     substance abuse (10-12 beers per day)  alcohol use, Dysphagia   Endo/Other  negative endocrine ROSHypothyroidism   Renal/GU negative Renal ROS  negative genitourinary   Musculoskeletal   Abdominal   Peds  Hematology negative hematology ROS (+)   Anesthesia Other Findings Patient states teeth are brittle  Reproductive/Obstetrics                           Anesthesia Physical Anesthesia Plan  ASA: III  Anesthesia Plan: General and MAC   Post-op Pain Management:    Induction: Intravenous  Airway Management Planned: Simple Face Mask and Nasal Cannula  Additional Equipment:   Intra-op Plan:   Post-operative Plan:   Informed Consent: I have reviewed the patients History and Physical, chart, labs and discussed the procedure including the risks, benefits and alternatives for the proposed anesthesia with the patient or authorized representative who has indicated his/her understanding and acceptance.   Dental advisory given  Plan Discussed with: CRNA  Anesthesia Plan Comments:         Anesthesia Quick Evaluation

## 2013-01-13 NOTE — Progress Notes (Signed)
12 LEAD EKG DONE 

## 2013-01-13 NOTE — Progress Notes (Signed)
[place] asked to evaluate the patient and answer questions regarding disposition. She has no prior cardiac history, she has a history of breast CA, advanced throat CA (elected to observe currently), ongoing tobacco abuse, COPD, GERD/esophageal stricture s/p dilatation, HTN and HLD. She denies prior cardiac issues. She had been having difficulties with dysphagia and followed up with her gastroenterologist Dr. Matthias Hughs who advised repeat EGD +/- dilatation. She did have evidence of an esophageal ring s/p dilatation. Peri-procedurally, she developed pulmonary instability/respiratory decompensation i.e. Increased respiratory effort, hypoxia with O2 sat 20s, hypertensive (240/120s). The patient was noted to have laryngospasm by scope. She received succinylcholine. She was not intubated due to oropharyngeal and changes from throat CA and treatment. She did maintain and airway and respirations eventually improved. O2 sat's 98%. EKG was obtained which revealed poor R wave progression V1-V3, no acute ST/T changes. Trop-I WNL x 1. She denies chest pain during this episode or previously. EKG demonstrates NSR, poor R wave progression V1-V3 and RAE c/w advanced COPD findings. She was reassured of these findings. The patient feels well currently, VSS and would like to go home, and in fact she is requesting when she can smoke and drink again. She is currently undergoing a period of observation from cancer treatment and would like to continue to enjoy her lifestyle choices. She was advised against this. Aggressive cardiac work-up is not warranted at this time in the absence of symptoms and advanced throat CA.    Jacqulyn Bath, PA-C 01/13/2013 11:46 AM I have taken a history, reviewed medications, allergies, PMH, SH, FH, and reviewed ROS and examined the patient.  I agree with the assessment and plan. No further cardiac work up at this time.  Eura Radabaugh C. Daleen Squibb, MD, [facility] Pager:  [phone]

## 2013-01-13 NOTE — Transfer of Care (Signed)
Immediate Anesthesia Transfer of Care Note  Patient: Taylor [name]  Procedure(s) Performed: Procedure(s): ESOPHAGOGASTRODUODENOSCOPY (EGD) (N/A) BALLOON DILATION (N/A)  Patient Location: PACU  Anesthesia Type:MAC  Level of Consciousness: awake and alert   Airway & Oxygen Therapy: Patient Spontanous Breathing and Patient connected to face mask oxygen  Post-op Assessment: Report given to PACU RN and Post -op Vital signs reviewed and stable  Post vital signs: Reviewed and stable  Complications: No apparent anesthesia complications

## 2013-01-14 ENCOUNTER — Encounter (HOSPITAL_COMMUNITY): Payer: Self-pay | Admitting: Gastroenterology

## 2013-02-04 ENCOUNTER — Other Ambulatory Visit: Payer: Self-pay

## 2013-03-13 ENCOUNTER — Other Ambulatory Visit: Payer: Self-pay | Admitting: Oncology

## 2013-03-13 DIAGNOSIS — C140 Malignant neoplasm of pharynx, unspecified: Secondary | ICD-10-CM

## 2013-03-17 ENCOUNTER — Telehealth: Payer: Self-pay | Admitting: Hematology and Oncology

## 2013-03-17 NOTE — Telephone Encounter (Signed)
Moved 9/17 appt to 10/1 w/NG. S/w pt re new appt for 10/1 lb/NG.Marland Kitchen

## 2013-03-18 ENCOUNTER — Other Ambulatory Visit: Payer: Medicare Other | Admitting: Lab

## 2013-03-18 ENCOUNTER — Ambulatory Visit: Payer: Medicare Other | Admitting: Oncology

## 2013-03-31 ENCOUNTER — Other Ambulatory Visit: Payer: Self-pay | Admitting: Hematology and Oncology

## 2013-03-31 DIAGNOSIS — Z853 Personal history of malignant neoplasm of breast: Secondary | ICD-10-CM

## 2013-03-31 DIAGNOSIS — E039 Hypothyroidism, unspecified: Secondary | ICD-10-CM

## 2013-03-31 DIAGNOSIS — E871 Hypo-osmolality and hyponatremia: Secondary | ICD-10-CM

## 2013-04-01 ENCOUNTER — Ambulatory Visit (HOSPITAL_BASED_OUTPATIENT_CLINIC_OR_DEPARTMENT_OTHER): Payer: Medicare Other | Admitting: Hematology and Oncology

## 2013-04-01 ENCOUNTER — Encounter: Payer: Self-pay | Admitting: Hematology and Oncology

## 2013-04-01 ENCOUNTER — Other Ambulatory Visit (HOSPITAL_BASED_OUTPATIENT_CLINIC_OR_DEPARTMENT_OTHER): Payer: Medicare Other | Admitting: Lab

## 2013-04-01 VITALS — BP 134/83 | HR 80 | Temp 98.2°F | Resp 18 | Ht 64.0 in | Wt 98.7 lb

## 2013-04-01 DIAGNOSIS — D649 Anemia, unspecified: Secondary | ICD-10-CM

## 2013-04-01 DIAGNOSIS — C12 Malignant neoplasm of pyriform sinus: Secondary | ICD-10-CM

## 2013-04-01 DIAGNOSIS — R110 Nausea: Secondary | ICD-10-CM

## 2013-04-01 DIAGNOSIS — E039 Hypothyroidism, unspecified: Secondary | ICD-10-CM

## 2013-04-01 DIAGNOSIS — F411 Generalized anxiety disorder: Secondary | ICD-10-CM

## 2013-04-01 DIAGNOSIS — Z853 Personal history of malignant neoplasm of breast: Secondary | ICD-10-CM

## 2013-04-01 DIAGNOSIS — C140 Malignant neoplasm of pharynx, unspecified: Secondary | ICD-10-CM

## 2013-04-01 DIAGNOSIS — D473 Essential (hemorrhagic) thrombocythemia: Secondary | ICD-10-CM

## 2013-04-01 DIAGNOSIS — F17200 Nicotine dependence, unspecified, uncomplicated: Secondary | ICD-10-CM

## 2013-04-01 DIAGNOSIS — E871 Hypo-osmolality and hyponatremia: Secondary | ICD-10-CM

## 2013-04-01 HISTORY — DX: Nausea: R11.0

## 2013-04-01 LAB — CBC WITH DIFFERENTIAL/PLATELET
BASO%: 1.4 % (ref 0.0–2.0)
EOS%: 4.7 % (ref 0.0–7.0)
HGB: 11 g/dL — ABNORMAL LOW (ref 11.6–15.9)
LYMPH%: 11 % — ABNORMAL LOW (ref 14.0–49.7)
MCHC: 34.6 g/dL (ref 31.5–36.0)
MCV: 89.8 fL (ref 79.5–101.0)
MONO%: 13.5 % (ref 0.0–14.0)
Platelets: 579 10*3/uL — ABNORMAL HIGH (ref 145–400)
RBC: 3.55 10*6/uL — ABNORMAL LOW (ref 3.70–5.45)

## 2013-04-01 LAB — COMPREHENSIVE METABOLIC PANEL (CC13)
AST: 19 U/L (ref 5–34)
Albumin: 3.3 g/dL — ABNORMAL LOW (ref 3.5–5.0)
Alkaline Phosphatase: 117 U/L (ref 40–150)
BUN: 9.9 mg/dL (ref 7.0–26.0)
Potassium: 4.2 mEq/L (ref 3.5–5.1)
Sodium: 133 mEq/L — ABNORMAL LOW (ref 136–145)

## 2013-04-01 LAB — T4, FREE: Free T4: 0.86 ng/dL (ref 0.80–1.80)

## 2013-04-01 MED ORDER — ONDANSETRON HCL 8 MG PO TABS: 8.0000 mg | ORAL_TABLET | Freq: Three times a day (TID) | ORAL | Status: DC | PRN

## 2013-04-01 MED ORDER — LORAZEPAM 0.5 MG PO TABS: 0.5000 mg | ORAL_TABLET | Freq: Three times a day (TID) | ORAL | Status: DC | PRN

## 2013-04-01 MED ORDER — LEVOTHYROXINE SODIUM 50 MCG PO TABS: 50.0000 ug | ORAL_TABLET | Freq: Every day | ORAL | Status: DC

## 2013-04-01 NOTE — Progress Notes (Signed)
[facility] OFFICE PROGRESS NOTE  [name], MD  DIAGNOSIS:   1. 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 She received concurrent chemoradiation therapy with weekly cisplatin, which started 04/18/2009 and ended 06/06/2009. Treatment dates were from April 18, 2009 through June 13, 2009. She had local recurrence and underwent on 05/16/2011 modified level IV left neck dissection with positive margin and gross muscle invasion. She has not been given any form of adjuvant treatment since then 2. 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 She was treated with three cycles of Cytoxan, Taxotere and could not finish all six planned cycles secondary to severe myalgia/arthralgia. She received adjuvant radiation therapy and was started on Femara from which she was nonadherent until she was diagnosed with piriform sinus SCC. She is now again on adjuvant Femara since 2011. When her head and neck cancer recurred in 2012, she discontinue Femara and screening mammogram  INTERVAL HISTORY: Taylor [name] 67 y.o. female returns for further followup with her friend Okey Regal. She discontinue her thyroid supplement about 6 months ago. She still have persistent dry mouth and difficulty swallowing. She has to have esophageal dilatation once in a while. She has not lost any weight. Denies any new lumps or bumps. Her energy level is fair. She's able to get by most activities of daily living. She has some anxiety of which she take Ativan on a regular basis. She also takes Zofran for anticipatory nausea. She has some mild constipation recently.  I have reviewed the past medical history, past surgical history, social history and family history with the patient and they are unchanged from previous  note.  ALLERGIES:  is allergic to ace inhibitors; hydrocodone; hydromorphone; tylox; and sulfa antibiotics.  MEDICATIONS: Current outpatient prescriptions:acetaminophen (TYLENOL) 325 MG tablet, Take 650 mg by mouth every 6 (six) hours as needed for pain. , Disp: , Rfl: ;  bisacodyl (DULCOLAX) 5 MG EC tablet, Take 5 mg by mouth daily as needed for constipation., Disp: , Rfl: ;  desoximetasone (TOPICORT) 0.25 % cream, Apply 1 application topically 2 (two) times daily. Apply to affected area , Disp: , Rfl:  diclofenac (VOLTAREN) 75 MG EC tablet, Take 75 mg by mouth 2 (two) times daily. , Disp: , Rfl: ;  levothyroxine (SYNTHROID, LEVOTHROID) 50 MCG tablet, Take 1 tablet (50 mcg total) by mouth daily before breakfast., Disp: 90 tablet, Rfl: 6;  LORazepam (ATIVAN) 0.5 MG tablet, Take 1 tablet (0.5 mg total) by mouth every 8 (eight) hours as needed for anxiety., Disp: 90 tablet, Rfl: 3 ondansetron (ZOFRAN) 8 MG tablet, Take 1 tablet (8 mg total) by mouth every 8 (eight) hours as needed for nausea., Disp: 90 tablet, Rfl: 3;  oxymetazoline (AFRIN) 0.05 % nasal spray, Place 1 spray into the nose 2 (two) times daily., Disp: , Rfl: ;  Pseudoephed-APAP-Guaifenesin (SUDAFED TRIPLE ACTION PO), Take 1 tablet by mouth 2 (two) times daily., Disp: , Rfl:  simethicone (MYLICON) 125 MG chewable tablet, Chew 125 mg by mouth every 6 (six) hours as needed for flatulence., Disp: , Rfl:   REVIEW OF SYSTEMS:   Constitutional: Denies fevers, chills or abnormal weight loss Eyes: Denies blurriness of vision Respiratory: Denies cough, dyspnea or wheezes Cardiovascular: Denies palpitation, chest discomfort or lower extremity swelling Skin: Denies abnormal skin rashes Lymphatics: Denies new lymphadenopathy  or easy bruising Neurological:Denies numbness, tingling or new weaknesses Behavioral/Psych: Mood is stable, no new changes  All other systems were reviewed with the patient and are negative.  PHYSICAL EXAMINATION: ECOG  PERFORMANCE STATUS: 1 - Symptomatic but completely ambulatory  Filed Vitals:   04/01/13 1155  BP: 134/83  Pulse: 80  Temp: 98.2 F (36.8 C)  Resp: 18   Filed Weights   04/01/13 1155  Weight: 98 lb 11.2 oz (44.77 kg)    GENERAL:alert, no distress and comfortable she looks thin and mildly cachectic SKIN: skin color, texture, turgor are normal, no rashes or significant lesions EYES: normal, Conjunctiva are pink and non-injected, sclera clear OROPHARYNX:no exudate, no erythema and lips, buccal mucosa, and tongue normal noted facial deformity NECK: supple, thyroid normal size, non-tender, without nodularity noted significant scarring from prior surgery and radiation LYMPH:  no palpable lymphadenopathy in the cervical, axillary or inguinal LUNGS: clear to auscultation and percussion with normal breathing effort HEART: regular rate & rhythm and no murmurs and no lower extremity edema ABDOMEN:abdomen soft, non-tender and normal bowel sounds Musculoskeletal:no cyanosis of digits and no clubbing  NEURO: alert & oriented x 3 with fluent speech, no focal motor/sensory deficits  LABORATORY DATA:  I have reviewed the data as listed    Component Value Date/Time   NA 133* 04/01/2013 1134   NA 131* 12/10/2011 0843   NA 136 03/14/2011 0954   K 4.2 04/01/2013 1134   K 4.8 12/10/2011 0843   K 5.5* 03/14/2011 0954   CL 96* 10/08/2012 0911   CL 96 12/10/2011 0843   CL 96* 03/14/2011 0954   CO2 27 04/01/2013 1134   CO2 29 12/10/2011 0843   CO2 26 03/14/2011 0954   GLUCOSE 106 04/01/2013 1134   GLUCOSE 112* 10/08/2012 0911   GLUCOSE 112* 12/10/2011 0843   GLUCOSE 113 03/14/2011 0954   BUN 9.9 04/01/2013 1134   BUN 10 12/10/2011 0843   BUN 11 03/14/2011 0954   CREATININE 0.7 04/01/2013 1134   CREATININE 0.68 12/10/2011 0843   CREATININE 0.5* 03/14/2011 0954   CALCIUM 9.5 04/01/2013 1134   CALCIUM 9.7 12/10/2011 0843   CALCIUM 9.5 03/14/2011 0954   PROT 6.9 04/01/2013 1134   PROT 6.2 12/10/2011 0843   PROT 8.0  03/14/2011 0954   ALBUMIN 3.3* 04/01/2013 1134   ALBUMIN 4.1 12/10/2011 0843   [name] 19 04/01/2013 1134   [name] 20 12/10/2011 0843   [name] 30 03/14/2011 0954   ALT 10 04/01/2013 1134   ALT 17 12/10/2011 0843   ALT 22 03/14/2011 0954   ALKPHOS 117 04/01/2013 1134   ALKPHOS 90 12/10/2011 0843   ALKPHOS 101* 03/14/2011 0954   BILITOT 0.27 04/01/2013 1134   BILITOT 0.4 12/10/2011 0843   BILITOT 0.50 03/14/2011 0954   GFRNONAA >90 05/20/2011 0640   GFRAA >90 05/20/2011 0640    I No results found for this basename: SPEP, UPEP,  kappa and lambda light chains    Lab Results  Component Value Date   WBC 7.7 04/01/2013   NEUTROABS 5.3 04/01/2013   HGB 11.0* 04/01/2013   HCT 31.9* 04/01/2013   MCV 89.8 04/01/2013   PLT 579* 04/01/2013      Chemistry      Component Value Date/Time   NA 133* 04/01/2013 1134   NA 131* 12/10/2011 0843   NA 136 03/14/2011 0954   K 4.2 04/01/2013 1134   K 4.8 12/10/2011 0843   K 5.5* 03/14/2011 0954   CL 96* 10/08/2012  0911   CL 96 12/10/2011 0843   CL 96* 03/14/2011 0954   CO2 27 04/01/2013 1134   CO2 29 12/10/2011 0843   CO2 26 03/14/2011 0954   BUN 9.9 04/01/2013 1134   BUN 10 12/10/2011 0843   BUN 11 03/14/2011 0954   CREATININE 0.7 04/01/2013 1134   CREATININE 0.68 12/10/2011 0843   CREATININE 0.5* 03/14/2011 0954      Component Value Date/Time   CALCIUM 9.5 04/01/2013 1134   CALCIUM 9.7 12/10/2011 0843   CALCIUM 9.5 03/14/2011 0954   ALKPHOS 117 04/01/2013 1134   ALKPHOS 90 12/10/2011 0843   ALKPHOS 101* 03/14/2011 0954   [name] 19 04/01/2013 1134   [name] 20 12/10/2011 0843   [name] 30 03/14/2011 0954   ALT 10 04/01/2013 1134   ALT 17 12/10/2011 0843   ALT 22 03/14/2011 0954   BILITOT 0.27 04/01/2013 1134   BILITOT 0.4 12/10/2011 0843   BILITOT 0.50 03/14/2011 0954      ASSESSMENT: History of breast cancer and squamous cell carcinoma of the larynx  PLAN:  #1 history of breast cancer The patient does not want to continue any form of adjuvant treatment for this. We will continue  supportive care only. She also does not one mammogram. #2 history of recurrent head and neck cancer The patient has possible microscopic disease. She does not want to take any adjuvant treatment after her last resection. The patient wants supportive care only. We will not order any imaging study #3 hypothyroidism I have recheck a thyroid function test today. The patient is instructed to resume her thyroid supplement. I have refilled her prescription directly to her pharmacy today #4 anxiety She will continue Ativan as needed for this. I refilled her prescription today. #5 chronic nausea I also refilled her prescription Zofran. She is aware that can sometimes cause constipation #6 preventive care I recommend influenza vaccination today. The patient declined. #7 anemia She is mildly anemic. This is likely anemia chronic disease. We'll observe. She does not require blood transfusion. This no recent bleeding. #8 mild thrombocytosis This could be reactive thrombosis. I recommend prevention again thromboembolism with 81 mg aspirin daily. #9 smoking The patient is not interested to quit. Counseling was provided.  All questions were answered. The patient knows to call the clinic with any problems, questions or concerns. We can certainly see the patient much sooner if necessary. No barriers to learning was detected. I spent 25 minutes counseling the patient face to face. The total time spent in the appointment was 40 minutes and more than 50% was on counseling and review of test results     [facility], Vahe Pienta, MD 04/01/2013 12:47 PM

## 2013-05-07 ENCOUNTER — Other Ambulatory Visit: Payer: Self-pay

## 2013-05-19 ENCOUNTER — Other Ambulatory Visit: Payer: Self-pay | Admitting: Oncology

## 2013-05-19 DIAGNOSIS — C140 Malignant neoplasm of pharynx, unspecified: Secondary | ICD-10-CM

## 2013-05-20 ENCOUNTER — Other Ambulatory Visit: Payer: Self-pay | Admitting: *Deleted

## 2013-05-20 ENCOUNTER — Other Ambulatory Visit: Payer: Self-pay | Admitting: Hematology and Oncology

## 2013-05-20 DIAGNOSIS — F411 Generalized anxiety disorder: Secondary | ICD-10-CM

## 2013-05-20 DIAGNOSIS — E039 Hypothyroidism, unspecified: Secondary | ICD-10-CM

## 2013-05-20 DIAGNOSIS — R110 Nausea: Secondary | ICD-10-CM

## 2013-05-20 DIAGNOSIS — C140 Malignant neoplasm of pharynx, unspecified: Secondary | ICD-10-CM

## 2013-05-20 MED ORDER — LORAZEPAM 0.5 MG PO TABS: 0.5000 mg | ORAL_TABLET | Freq: Three times a day (TID) | ORAL | Status: DC | PRN

## 2013-06-15 ENCOUNTER — Other Ambulatory Visit: Payer: Self-pay | Admitting: *Deleted

## 2013-06-15 ENCOUNTER — Other Ambulatory Visit: Payer: Self-pay | Admitting: Hematology and Oncology

## 2013-06-15 DIAGNOSIS — C140 Malignant neoplasm of pharynx, unspecified: Secondary | ICD-10-CM

## 2013-06-15 MED ORDER — OMEPRAZOLE 20 MG PO CPDR: 20.0000 mg | DELAYED_RELEASE_CAPSULE | Freq: Every day | ORAL | Status: DC

## 2013-07-04 ENCOUNTER — Inpatient Hospital Stay (HOSPITAL_COMMUNITY)
Admission: EM | Admit: 2013-07-04 | Discharge: 2013-07-27 | DRG: 329 | Disposition: A | Discharge status: Hospice | Payer: Medicare Other | Attending: General Surgery | Admitting: General Surgery

## 2013-07-04 ENCOUNTER — Emergency Department (HOSPITAL_COMMUNITY): Payer: Medicare Other

## 2013-07-04 DIAGNOSIS — Z889 Allergy status to unspecified drugs, medicaments and biological substances status: Secondary | ICD-10-CM

## 2013-07-04 DIAGNOSIS — J9601 Acute respiratory failure with hypoxia: Secondary | ICD-10-CM

## 2013-07-04 DIAGNOSIS — K859 Acute pancreatitis without necrosis or infection, unspecified: Secondary | ICD-10-CM

## 2013-07-04 DIAGNOSIS — Y833 Surgical operation with formation of external stoma as the cause of abnormal reaction of the patient, or of later complication, without mention of misadventure at the time of the procedure: Secondary | ICD-10-CM | POA: Diagnosis not present

## 2013-07-04 DIAGNOSIS — Q393 Congenital stenosis and stricture of esophagus: Secondary | ICD-10-CM

## 2013-07-04 DIAGNOSIS — D473 Essential (hemorrhagic) thrombocythemia: Secondary | ICD-10-CM | POA: Diagnosis not present

## 2013-07-04 DIAGNOSIS — R188 Other ascites: Secondary | ICD-10-CM

## 2013-07-04 DIAGNOSIS — R5383 Other fatigue: Secondary | ICD-10-CM

## 2013-07-04 DIAGNOSIS — F410 Panic disorder [episodic paroxysmal anxiety] without agoraphobia: Secondary | ICD-10-CM | POA: Diagnosis not present

## 2013-07-04 DIAGNOSIS — T884XXD Failed or difficult intubation, subsequent encounter: Secondary | ICD-10-CM

## 2013-07-04 DIAGNOSIS — Z9221 Personal history of antineoplastic chemotherapy: Secondary | ICD-10-CM

## 2013-07-04 DIAGNOSIS — Z515 Encounter for palliative care: Secondary | ICD-10-CM

## 2013-07-04 DIAGNOSIS — E43 Unspecified severe protein-calorie malnutrition: Secondary | ICD-10-CM | POA: Diagnosis present

## 2013-07-04 DIAGNOSIS — Q391 Atresia of esophagus with tracheo-esophageal fistula: Secondary | ICD-10-CM

## 2013-07-04 DIAGNOSIS — J9600 Acute respiratory failure, unspecified whether with hypoxia or hypercapnia: Secondary | ICD-10-CM | POA: Diagnosis not present

## 2013-07-04 DIAGNOSIS — Z800 Family history of malignant neoplasm of digestive organs: Secondary | ICD-10-CM

## 2013-07-04 DIAGNOSIS — Z853 Personal history of malignant neoplasm of breast: Secondary | ICD-10-CM

## 2013-07-04 DIAGNOSIS — E785 Hyperlipidemia, unspecified: Secondary | ICD-10-CM | POA: Diagnosis present

## 2013-07-04 DIAGNOSIS — E860 Dehydration: Secondary | ICD-10-CM

## 2013-07-04 DIAGNOSIS — I4891 Unspecified atrial fibrillation: Secondary | ICD-10-CM | POA: Diagnosis not present

## 2013-07-04 DIAGNOSIS — J90 Pleural effusion, not elsewhere classified: Secondary | ICD-10-CM | POA: Diagnosis not present

## 2013-07-04 DIAGNOSIS — J189 Pneumonia, unspecified organism: Secondary | ICD-10-CM | POA: Diagnosis not present

## 2013-07-04 DIAGNOSIS — I469 Cardiac arrest, cause unspecified: Secondary | ICD-10-CM | POA: Diagnosis not present

## 2013-07-04 DIAGNOSIS — A419 Sepsis, unspecified organism: Secondary | ICD-10-CM

## 2013-07-04 DIAGNOSIS — Z79899 Other long term (current) drug therapy: Secondary | ICD-10-CM

## 2013-07-04 DIAGNOSIS — N179 Acute kidney failure, unspecified: Secondary | ICD-10-CM | POA: Diagnosis not present

## 2013-07-04 DIAGNOSIS — T884XXA Failed or difficult intubation, initial encounter: Secondary | ICD-10-CM

## 2013-07-04 DIAGNOSIS — J150 Pneumonia due to Klebsiella pneumoniae: Secondary | ICD-10-CM | POA: Diagnosis present

## 2013-07-04 DIAGNOSIS — F17200 Nicotine dependence, unspecified, uncomplicated: Secondary | ICD-10-CM | POA: Diagnosis present

## 2013-07-04 DIAGNOSIS — Z8249 Family history of ischemic heart disease and other diseases of the circulatory system: Secondary | ICD-10-CM

## 2013-07-04 DIAGNOSIS — E872 Acidosis, unspecified: Secondary | ICD-10-CM | POA: Diagnosis not present

## 2013-07-04 DIAGNOSIS — R34 Anuria and oliguria: Secondary | ICD-10-CM | POA: Diagnosis not present

## 2013-07-04 DIAGNOSIS — R0600 Dyspnea, unspecified: Secondary | ICD-10-CM

## 2013-07-04 DIAGNOSIS — Z9889 Other specified postprocedural states: Secondary | ICD-10-CM

## 2013-07-04 DIAGNOSIS — K5900 Constipation, unspecified: Secondary | ICD-10-CM | POA: Diagnosis present

## 2013-07-04 DIAGNOSIS — K9423 Gastrostomy malfunction: Secondary | ICD-10-CM | POA: Diagnosis not present

## 2013-07-04 DIAGNOSIS — R1310 Dysphagia, unspecified: Secondary | ICD-10-CM

## 2013-07-04 DIAGNOSIS — R578 Other shock: Secondary | ICD-10-CM | POA: Diagnosis not present

## 2013-07-04 DIAGNOSIS — Z91199 Patient's noncompliance with other medical treatment and regimen due to unspecified reason: Secondary | ICD-10-CM

## 2013-07-04 DIAGNOSIS — M1990 Unspecified osteoarthritis, unspecified site: Secondary | ICD-10-CM

## 2013-07-04 DIAGNOSIS — Z8521 Personal history of malignant neoplasm of larynx: Secondary | ICD-10-CM

## 2013-07-04 DIAGNOSIS — J9819 Other pulmonary collapse: Secondary | ICD-10-CM | POA: Diagnosis not present

## 2013-07-04 DIAGNOSIS — E039 Hypothyroidism, unspecified: Secondary | ICD-10-CM

## 2013-07-04 DIAGNOSIS — D638 Anemia in other chronic diseases classified elsewhere: Secondary | ICD-10-CM | POA: Diagnosis present

## 2013-07-04 DIAGNOSIS — C140 Malignant neoplasm of pharynx, unspecified: Secondary | ICD-10-CM

## 2013-07-04 DIAGNOSIS — R634 Abnormal weight loss: Secondary | ICD-10-CM

## 2013-07-04 DIAGNOSIS — K659 Peritonitis, unspecified: Secondary | ICD-10-CM | POA: Diagnosis present

## 2013-07-04 DIAGNOSIS — K275 Chronic or unspecified peptic ulcer, site unspecified, with perforation: Secondary | ICD-10-CM | POA: Diagnosis present

## 2013-07-04 DIAGNOSIS — D75839 Thrombocytosis, unspecified: Secondary | ICD-10-CM | POA: Diagnosis present

## 2013-07-04 DIAGNOSIS — J310 Chronic rhinitis: Secondary | ICD-10-CM | POA: Diagnosis present

## 2013-07-04 DIAGNOSIS — K631 Perforation of intestine (nontraumatic): Principal | ICD-10-CM

## 2013-07-04 DIAGNOSIS — E871 Hypo-osmolality and hyponatremia: Secondary | ICD-10-CM

## 2013-07-04 DIAGNOSIS — Z170 Estrogen receptor positive status [ER+]: Secondary | ICD-10-CM

## 2013-07-04 DIAGNOSIS — E8809 Other disorders of plasma-protein metabolism, not elsewhere classified: Secondary | ICD-10-CM | POA: Diagnosis not present

## 2013-07-04 DIAGNOSIS — F1010 Alcohol abuse, uncomplicated: Secondary | ICD-10-CM | POA: Diagnosis present

## 2013-07-04 DIAGNOSIS — Z888 Allergy status to other drugs, medicaments and biological substances status: Secondary | ICD-10-CM

## 2013-07-04 DIAGNOSIS — R109 Unspecified abdominal pain: Secondary | ICD-10-CM

## 2013-07-04 DIAGNOSIS — I10 Essential (primary) hypertension: Secondary | ICD-10-CM

## 2013-07-04 DIAGNOSIS — R69 Illness, unspecified: Secondary | ICD-10-CM | POA: Diagnosis present

## 2013-07-04 DIAGNOSIS — Z85828 Personal history of other malignant neoplasm of skin: Secondary | ICD-10-CM

## 2013-07-04 DIAGNOSIS — T411X5A Adverse effect of intravenous anesthetics, initial encounter: Secondary | ICD-10-CM | POA: Diagnosis not present

## 2013-07-04 DIAGNOSIS — D62 Acute posthemorrhagic anemia: Secondary | ICD-10-CM | POA: Diagnosis present

## 2013-07-04 DIAGNOSIS — K651 Peritoneal abscess: Secondary | ICD-10-CM | POA: Diagnosis not present

## 2013-07-04 DIAGNOSIS — G893 Neoplasm related pain (acute) (chronic): Secondary | ICD-10-CM | POA: Diagnosis present

## 2013-07-04 DIAGNOSIS — R6521 Severe sepsis with septic shock: Secondary | ICD-10-CM

## 2013-07-04 DIAGNOSIS — F411 Generalized anxiety disorder: Secondary | ICD-10-CM | POA: Diagnosis present

## 2013-07-04 DIAGNOSIS — Z9119 Patient's noncompliance with other medical treatment and regimen: Secondary | ICD-10-CM

## 2013-07-04 DIAGNOSIS — E440 Moderate protein-calorie malnutrition: Secondary | ICD-10-CM | POA: Diagnosis present

## 2013-07-04 DIAGNOSIS — E876 Hypokalemia: Secondary | ICD-10-CM | POA: Diagnosis not present

## 2013-07-04 DIAGNOSIS — Z66 Do not resuscitate: Secondary | ICD-10-CM | POA: Diagnosis present

## 2013-07-04 DIAGNOSIS — E162 Hypoglycemia, unspecified: Secondary | ICD-10-CM | POA: Diagnosis not present

## 2013-07-04 DIAGNOSIS — K289 Gastrojejunal ulcer, unspecified as acute or chronic, without hemorrhage or perforation: Secondary | ICD-10-CM | POA: Diagnosis present

## 2013-07-04 DIAGNOSIS — I429 Cardiomyopathy, unspecified: Secondary | ICD-10-CM | POA: Diagnosis present

## 2013-07-04 DIAGNOSIS — C329 Malignant neoplasm of larynx, unspecified: Secondary | ICD-10-CM | POA: Diagnosis present

## 2013-07-04 HISTORY — DX: Failed or difficult intubation, initial encounter: T88.4XXA

## 2013-07-04 LAB — COMPREHENSIVE METABOLIC PANEL
ALT: 9 U/L (ref 0–35)
AST: 20 U/L (ref 0–37)
Albumin: 3 g/dL — ABNORMAL LOW (ref 3.5–5.2)
Alkaline Phosphatase: 68 U/L (ref 39–117)
BILIRUBIN TOTAL: 0.4 mg/dL (ref 0.3–1.2)
BUN: 23 mg/dL (ref 6–23)
CALCIUM: 9.5 mg/dL (ref 8.4–10.5)
CHLORIDE: 81 meq/L — AB (ref 96–112)
CO2: 22 meq/L (ref 19–32)
CREATININE: 1.63 mg/dL — AB (ref 0.50–1.10)
GFR, EST AFRICAN AMERICAN: 37 mL/min — AB (ref >90)
GFR, EST NON AFRICAN AMERICAN: 32 mL/min — AB (ref >90)
GLUCOSE: 128 mg/dL — AB (ref 70–99)
Potassium: 4 mEq/L (ref 3.7–5.3)
Sodium: 125 mEq/L — ABNORMAL LOW (ref 137–147)
Total Protein: 6.9 g/dL (ref 6.0–8.3)

## 2013-07-04 LAB — CBC WITH DIFFERENTIAL/PLATELET
BASOS ABS: 0 10*3/uL (ref 0.0–0.1)
Basophils Relative: 0 % (ref 0–1)
EOS PCT: 1 % (ref 0–5)
Eosinophils Absolute: 0.1 10*3/uL (ref 0.0–0.7)
HCT: 35.2 % — ABNORMAL LOW (ref 36.0–46.0)
Hemoglobin: 12.3 g/dL (ref 12.0–15.0)
LYMPHS ABS: 0.4 10*3/uL — AB (ref 0.7–4.0)
Lymphocytes Relative: 8 % — ABNORMAL LOW (ref 12–46)
MCH: 29 pg (ref 26.0–34.0)
MCHC: 34.9 g/dL (ref 30.0–36.0)
MCV: 83 fL (ref 78.0–100.0)
MONOS PCT: 5 % (ref 3–12)
Monocytes Absolute: 0.3 10*3/uL (ref 0.1–1.0)
NEUTROS PCT: 86 % — AB (ref 43–77)
Neutro Abs: 4.8 10*3/uL (ref 1.7–7.7)
PLATELETS: 709 10*3/uL — AB (ref 150–400)
RBC: 4.24 MIL/uL (ref 3.87–5.11)
RDW: 13.4 % (ref 11.5–15.5)
WBC Morphology: INCREASED
WBC: 5.6 10*3/uL (ref 4.0–10.5)

## 2013-07-04 LAB — LIPASE, BLOOD: LIPASE: 150 U/L — AB (ref 11–59)

## 2013-07-04 LAB — POCT I-STAT TROPONIN I: TROPONIN I, POC: 0.01 ng/mL (ref 0.00–0.08)

## 2013-07-04 MED ORDER — SODIUM CHLORIDE 0.9 % IV SOLN
1000.0000 mL | Freq: Once | INTRAVENOUS | Status: AC
  Administered 2013-07-05: 1000 mL via INTRAVENOUS

## 2013-07-04 MED ORDER — SODIUM CHLORIDE 0.9 % IV SOLN
1000.0000 mL | Freq: Once | INTRAVENOUS | Status: AC
  Administered 2013-07-04: 1000 mL via INTRAVENOUS

## 2013-07-04 MED ORDER — ONDANSETRON HCL 4 MG/2ML IJ SOLN
4.0000 mg | Freq: Once | INTRAMUSCULAR | Status: AC
  Administered 2013-07-04: 4 mg via INTRAVENOUS
  Filled 2013-07-04: qty 2

## 2013-07-04 MED ORDER — SODIUM CHLORIDE 0.9 % IV SOLN
1000.0000 mL | INTRAVENOUS | Status: DC
  Administered 2013-07-05: 1000 mL via INTRAVENOUS

## 2013-07-04 MED ORDER — HYDROMORPHONE HCL PF 1 MG/ML IJ SOLN
1.0000 mg | Freq: Once | INTRAMUSCULAR | Status: AC
  Administered 2013-07-05: 1 mg via INTRAVENOUS
  Filled 2013-07-04: qty 1

## 2013-07-04 MED ORDER — FENTANYL CITRATE 0.05 MG/ML IJ SOLN
50.0000 ug | INTRAMUSCULAR | Status: DC | PRN
  Administered 2013-07-04: 50 ug via INTRAVENOUS

## 2013-07-04 MED ORDER — FENTANYL CITRATE 0.05 MG/ML IJ SOLN
50.0000 ug | Freq: Once | INTRAMUSCULAR | Status: AC
  Administered 2013-07-04: 50 ug via INTRAVENOUS
  Filled 2013-07-04: qty 2

## 2013-07-04 NOTE — ED Provider Notes (Signed)
CSN: [id number]     Arrival date & time 07/04/13  2159 History   First MD Initiated Contact with Patient 07/04/13 2207     Chief Complaint  Patient presents with  . Abdominal Pain  . Weakness  . Emesis   HPI Patient presents to emergency room with complaints of nausea vomiting and abdominal pain. Patient has history of metastatic cancer. She does have history of breast cancer as well as laryngeal cancer. Patient is no longer receiving any active treatments. She is only getting supportive care at this time. Patient states that she's had decreased appetite over the last week. In the last day however she started developing nausea vomiting and abdominal pain. Patient states she's had multiple episodes of vomiting. Too numerous to count. She denies any diarrhea. She has had constipation and decreased bowel movements. She has not noticed any blood in her emesis. Patient denies any history of prior chronic abdominal problems. She has no known ill contacts.  Pt does admit to drinking a lot of beer yesterday. Past Medical History  Diagnosis Date  . Hyperlipidemia     STATES SHE NO LONGER HAS HI CHOLESTEROL  . Rhinitis   . Fatigue 12/10/2011  . Hypothyroid 12/11/2011  . Hypertension     hx of, no current med for last 3 years  . Throat cancer 2010  . Breast CA 2008    right breast  . Headache(784.0)     tension  . Nausea alone 04/01/2013   Past Surgical History  Procedure Laterality Date  . Panendoscopy  05/16/2011    Procedure: PANENDOSCOPY;  Surgeon: Tyson Alias;  Location: [place];  Service: ENT;  Laterality: N/A;  Direct laryngoscopy, esophagoscopy  . Radical neck dissection  05/16/2011    Procedure: RADICAL NECK DISSECTION;  Surgeon: Tyson Alias;  Location: [place];  Service: ENT;  Laterality: Left;  Modified radical left neck dissection   . Appendectomy  yrs ago  . Cysts removed from back abd breast  yrs ago  . Breast lumpectomy Right 2008  . Esophagogastroduodenoscopy N/A 01/13/2013     Procedure: ESOPHAGOGASTRODUODENOSCOPY (EGD);  Surgeon: Cleotis Nipper, MD;  Location: [facility];  Service: Endoscopy;  Laterality: N/A;  . Balloon dilation N/A 01/13/2013    Procedure: BALLOON DILATION;  Surgeon: Cleotis Nipper, MD;  Location: [facility];  Service: Endoscopy;  Laterality: N/A;   Family History  Problem Relation Age of Onset  . Hypertension    . Throat cancer    . Rheum arthritis    . Hypertension Mother   . Hyperlipidemia Mother   . Throat cancer Father    History  Substance Use Topics  . Smoking status: Current Every Day Smoker -- 2.00 packs/day for 40 years    Types: Cigarettes    Last Attempt to Quit: 04/21/2011  . Smokeless tobacco: Never Used  . Alcohol Use: 0.0 oz/week     Comment: 12 cans beer per day   OB History   Grav Para Term Preterm Abortions TAB SAB Ect Mult Living   0 0             Review of Systems  All other systems reviewed and are negative.    Allergies  Ace inhibitors; Hydrocodone; Hydromorphone; Tylox; and Sulfa antibiotics  Home Medications   Current Outpatient Rx  Name  Route  Sig  Dispense  Refill  . acetaminophen (TYLENOL) 325 MG tablet   Oral   Take 650 mg by mouth every 6 (six)  hours as needed for pain.          . bisacodyl (DULCOLAX) 5 MG EC tablet   Oral   Take 5 mg by mouth daily as needed for constipation.         Marland Kitchen desoximetasone (TOPICORT) 0.25 % cream   Topical   Apply 1 application topically 2 (two) times daily. Apply to affected area          . diclofenac (VOLTAREN) 75 MG EC tablet   Oral   Take 75 mg by mouth 2 (two) times daily.          Marland Kitchen levothyroxine (SYNTHROID, LEVOTHROID) 50 MCG tablet   Oral   Take 1 tablet (50 mcg total) by mouth daily before breakfast.   90 tablet   6   . LORazepam (ATIVAN) 0.5 MG tablet   Oral   Take 1 tablet (0.5 mg total) by mouth every 8 (eight) hours as needed for anxiety.   90 tablet   3   . omeprazole (PRILOSEC) 20 MG capsule   Oral   Take 1  capsule (20 mg total) by mouth daily.   90 capsule   3   . omeprazole (PRILOSEC) 20 MG capsule      TAKE 1 CAPSULE (20 MG TOTAL) BY MOUTH DAILY.   90 capsule   1   . ondansetron (ZOFRAN) 8 MG tablet   Oral   Take 1 tablet (8 mg total) by mouth every 8 (eight) hours as needed for nausea.   90 tablet   3   . oxymetazoline (AFRIN) 0.05 % nasal spray   Nasal   Place 1 spray into the nose 2 (two) times daily.         . Pseudoephed-APAP-Guaifenesin (SUDAFED TRIPLE ACTION PO)   Oral   Take 1 tablet by mouth 2 (two) times daily.         . simethicone (MYLICON) 664 MG chewable tablet   Oral   Chew 125 mg by mouth every 6 (six) hours as needed for flatulence.          BP 105/71  Pulse 104  Temp(Src) 97.6 F (36.4 C) (Oral)  Resp 33  SpO2 91% Physical Exam  Nursing note and vitals reviewed. Constitutional: She appears cachectic. She appears ill. She appears distressed.  HENT:  Head: Normocephalic and atraumatic.  Right Ear: External ear normal.  Left Ear: External ear normal.  Eyes: Conjunctivae are normal. Right eye exhibits no discharge. Left eye exhibits no discharge. No scleral icterus.  Neck: Neck supple. No tracheal deviation present.  Cardiovascular: Normal rate, regular rhythm and intact distal pulses.   Pulmonary/Chest: Effort normal and breath sounds normal. No stridor. No respiratory distress. She has no wheezes. She has no rales.  Abdominal: Soft. Bowel sounds are normal. She exhibits distension. She exhibits no mass. There is tenderness. There is guarding. There is no rebound.  Musculoskeletal: She exhibits no edema and no tenderness.  Neurological: She is alert. No sensory deficit. Cranial nerve deficit:  no gross defecits noted. She exhibits normal muscle tone. She displays no seizure activity. Coordination normal.  General weakness  Skin: Skin is warm. No rash noted. She is not diaphoretic. There is pallor.  Mottled hands ( pt has history of raynauds, not  atypical for her)    ED Course  Procedures (including critical care time) 2232 Brief bedside ultrasound in the ED without obvious aortic dilatation 2232 Confirmed code status with family.  DNR  Labs Review  Labs Reviewed  COMPREHENSIVE METABOLIC PANEL  LIPASE, BLOOD  URINALYSIS, ROUTINE W REFLEX MICROSCOPIC  CBC WITH DIFFERENTIAL   Imaging Review No results found.  EKG Interpretation   None       MDM   Pt does have elevated lipase.  She was drinking beer yesterday.  Symptoms may be related to pancreatitis.  Will check abdominal ct.  Plan on medical admission assuming no acute surgical process on CT scan.  Will turn over to dr Lita Mains.   Kathalene Frames, MD 07/05/13 [phone]

## 2013-07-04 NOTE — ED Notes (Signed)
Patient here with approximately 1 day of Emesis and abdominal pain. Today developed weakness and was found by EMS to be hypotensive to SBP as low as 70. History of Throat cancer x2 and Breast Cancer, but is currently not undergoing treatment.

## 2013-07-05 ENCOUNTER — Inpatient Hospital Stay (HOSPITAL_COMMUNITY): Payer: Medicare Other

## 2013-07-05 ENCOUNTER — Encounter (HOSPITAL_COMMUNITY): Admission: EM | Disposition: A | Discharge status: Hospice | Payer: Self-pay | Source: Home / Self Care

## 2013-07-05 ENCOUNTER — Encounter (HOSPITAL_COMMUNITY): Payer: Medicare Other | Admitting: Certified Registered"

## 2013-07-05 ENCOUNTER — Emergency Department (HOSPITAL_COMMUNITY): Payer: Medicare Other | Admitting: Certified Registered"

## 2013-07-05 ENCOUNTER — Emergency Department (HOSPITAL_COMMUNITY): Payer: Medicare Other

## 2013-07-05 ENCOUNTER — Encounter (HOSPITAL_COMMUNITY): Payer: Self-pay | Admitting: Radiology

## 2013-07-05 DIAGNOSIS — K275 Chronic or unspecified peptic ulcer, site unspecified, with perforation: Secondary | ICD-10-CM | POA: Diagnosis present

## 2013-07-05 DIAGNOSIS — E46 Unspecified protein-calorie malnutrition: Secondary | ICD-10-CM

## 2013-07-05 DIAGNOSIS — R6521 Severe sepsis with septic shock: Secondary | ICD-10-CM

## 2013-07-05 DIAGNOSIS — Z85819 Personal history of malignant neoplasm of unspecified site of lip, oral cavity, and pharynx: Secondary | ICD-10-CM

## 2013-07-05 DIAGNOSIS — A419 Sepsis, unspecified organism: Secondary | ICD-10-CM

## 2013-07-05 DIAGNOSIS — K631 Perforation of intestine (nontraumatic): Principal | ICD-10-CM

## 2013-07-05 DIAGNOSIS — J9600 Acute respiratory failure, unspecified whether with hypoxia or hypercapnia: Secondary | ICD-10-CM

## 2013-07-05 HISTORY — PX: LAPAROTOMY: SHX154

## 2013-07-05 LAB — POCT I-STAT 3, ART BLOOD GAS (G3+)
ACID-BASE DEFICIT: 8 mmol/L — AB (ref 0.0–2.0)
ACID-BASE DEFICIT: 10 mmol/L — AB (ref 0.0–2.0)
Acid-base deficit: 11 mmol/L — ABNORMAL HIGH (ref 0.0–2.0)
BICARBONATE: 16.5 meq/L — AB (ref 20.0–24.0)
Bicarbonate: 20.4 mEq/L (ref 20.0–24.0)
Bicarbonate: 15.2 mEq/L — ABNORMAL LOW (ref 20.0–24.0)
O2 SAT: 100 %
O2 Saturation: 98 %
O2 Saturation: 97 %
PCO2 ART: 33 mmHg — AB (ref 35.0–45.0)
PO2 ART: 465 mmHg — AB (ref 80.0–100.0)
PO2 ART: 102 mmHg — AB (ref 80.0–100.0)
Patient temperature: 97.8
Patient temperature: 98.8
Patient temperature: 98.8
TCO2: 22 mmol/L (ref 0–100)
TCO2: 16 mmol/L (ref 0–100)
TCO2: 18 mmol/L (ref 0–100)
pCO2 arterial: 49.8 mmHg — ABNORMAL HIGH (ref 35.0–45.0)
pCO2 arterial: 38.5 mmHg (ref 35.0–45.0)
pH, Arterial: 7.217 — ABNORMAL LOW (ref 7.350–7.450)
pH, Arterial: 7.271 — ABNORMAL LOW (ref 7.350–7.450)
pH, Arterial: 7.242 — ABNORMAL LOW (ref 7.350–7.450)
pO2, Arterial: 114 mmHg — ABNORMAL HIGH (ref 80.0–100.0)

## 2013-07-05 LAB — BASIC METABOLIC PANEL
BUN: 29 mg/dL — AB (ref 6–23)
CO2: 19 mEq/L (ref 19–32)
Calcium: 6.8 mg/dL — ABNORMAL LOW (ref 8.4–10.5)
Chloride: 96 mEq/L (ref 96–112)
Creatinine, Ser: 1.69 mg/dL — ABNORMAL HIGH (ref 0.50–1.10)
GFR calc Af Amer: 35 mL/min — ABNORMAL LOW (ref >90)
GFR calc non Af Amer: 30 mL/min — ABNORMAL LOW (ref >90)
GLUCOSE: 82 mg/dL (ref 70–99)
POTASSIUM: 4.1 meq/L (ref 3.7–5.3)
Sodium: 127 mEq/L — ABNORMAL LOW (ref 137–147)

## 2013-07-05 LAB — CBC
HEMATOCRIT: 24.2 % — AB (ref 36.0–46.0)
HEMOGLOBIN: 8.4 g/dL — AB (ref 12.0–15.0)
MCH: 29 pg (ref 26.0–34.0)
MCHC: 34.7 g/dL (ref 30.0–36.0)
MCV: 83.4 fL (ref 78.0–100.0)
Platelets: 434 10*3/uL — ABNORMAL HIGH (ref 150–400)
RBC: 2.9 MIL/uL — ABNORMAL LOW (ref 3.87–5.11)
RDW: 13.5 % (ref 11.5–15.5)
WBC: 2.2 10*3/uL — ABNORMAL LOW (ref 4.0–10.5)

## 2013-07-05 LAB — LACTIC ACID, PLASMA
LACTIC ACID, VENOUS: 1.9 mmol/L (ref 0.5–2.2)
Lactic Acid, Venous: 1.8 mmol/L (ref 0.5–2.2)

## 2013-07-05 LAB — GLUCOSE, CAPILLARY
GLUCOSE-CAPILLARY: 108 mg/dL — AB (ref 70–99)
Glucose-Capillary: 57 mg/dL — ABNORMAL LOW (ref 70–99)
Glucose-Capillary: 213 mg/dL — ABNORMAL HIGH (ref 70–99)

## 2013-07-05 LAB — MRSA PCR SCREENING: MRSA by PCR: NEGATIVE

## 2013-07-05 LAB — TROPONIN I: Troponin I: <0.3 ng/mL (ref <0.30)

## 2013-07-05 LAB — PROTIME-INR
INR: 1.49 (ref 0.00–1.49)
Prothrombin Time: 17.6 seconds — ABNORMAL HIGH (ref 11.6–15.2)

## 2013-07-05 LAB — TRIGLYCERIDES: Triglycerides: 134 mg/dL (ref <150)

## 2013-07-05 SURGERY — LAPAROTOMY, EXPLORATORY
Anesthesia: General | Site: Abdomen

## 2013-07-05 MED ORDER — SODIUM CHLORIDE 0.9 % IV BOLUS (SEPSIS)
500.0000 mL | Freq: Once | INTRAVENOUS | Status: AC
  Administered 2013-07-06: 500 mL via INTRAVENOUS

## 2013-07-05 MED ORDER — PHENYLEPHRINE HCL 10 MG/ML IJ SOLN
INTRAMUSCULAR | Status: DC | PRN
  Administered 2013-07-05 (×6): 40 ug via INTRAVENOUS

## 2013-07-05 MED ORDER — DEXTROSE 5 % IV SOLN
30.0000 ug/min | INTRAVENOUS | Status: DC
  Administered 2013-07-05: 150 ug/min via INTRAVENOUS
  Administered 2013-07-06: 110 ug/min via INTRAVENOUS
  Filled 2013-07-05 (×4): qty 4

## 2013-07-05 MED ORDER — SODIUM CHLORIDE 0.9 % IV SOLN
Freq: Once | INTRAVENOUS | Status: AC
  Administered 2013-07-05: 999 mL/h via INTRAVENOUS

## 2013-07-05 MED ORDER — FENTANYL CITRATE 0.05 MG/ML IJ SOLN
50.0000 ug | INTRAMUSCULAR | Status: DC | PRN
Start: 2013-07-05 — End: 2013-07-05

## 2013-07-05 MED ORDER — PIPERACILLIN-TAZOBACTAM 3.375 G IVPB
3.3750 g | Freq: Three times a day (TID) | INTRAVENOUS | Status: DC
  Administered 2013-07-05 – 2013-07-14 (×28): 3.375 g via INTRAVENOUS
  Filled 2013-07-05 (×34): qty 50

## 2013-07-05 MED ORDER — PANTOPRAZOLE SODIUM 40 MG IV SOLR
40.0000 mg | Freq: Every day | INTRAVENOUS | Status: DC
  Filled 2013-07-05: qty 40

## 2013-07-05 MED ORDER — BSS IO SOLN
INTRAOCULAR | Status: AC
  Filled 2013-07-05: qty 15

## 2013-07-05 MED ORDER — ALBUMIN HUMAN 5 % IV SOLN
INTRAVENOUS | Status: DC | PRN
  Administered 2013-07-05 (×2): via INTRAVENOUS

## 2013-07-05 MED ORDER — ROCURONIUM BROMIDE 100 MG/10ML IV SOLN
INTRAVENOUS | Status: DC | PRN
  Administered 2013-07-05 (×2): 50 mg via INTRAVENOUS

## 2013-07-05 MED ORDER — ENOXAPARIN SODIUM 40 MG/0.4ML ~~LOC~~ SOLN
40.0000 mg | SUBCUTANEOUS | Status: DC
  Filled 2013-07-05: qty 0.4

## 2013-07-05 MED ORDER — INSULIN ASPART 100 UNIT/ML ~~LOC~~ SOLN: 0.0000 [IU] | Freq: Three times a day (TID) | SUBCUTANEOUS | Status: DC

## 2013-07-05 MED ORDER — PROPOFOL 10 MG/ML IV EMUL
0.0000 ug/kg/min | INTRAVENOUS | Status: DC
  Administered 2013-07-05: 10 ug/kg/min via INTRAVENOUS
  Filled 2013-07-05: qty 100

## 2013-07-05 MED ORDER — FENTANYL CITRATE 0.05 MG/ML IJ SOLN
INTRAMUSCULAR | Status: DC | PRN
Start: 2013-07-05 — End: 2013-07-05
  Administered 2013-07-05: 100 ug via INTRAVENOUS

## 2013-07-05 MED ORDER — DEXTROSE 50 % IV SOLN
INTRAVENOUS | Status: AC
  Administered 2013-07-05: 50 mL
  Filled 2013-07-05: qty 50

## 2013-07-05 MED ORDER — FENTANYL CITRATE 0.05 MG/ML IJ SOLN
50.0000 ug | INTRAMUSCULAR | Status: DC | PRN
  Administered 2013-07-05: 50 ug via INTRAVENOUS
  Filled 2013-07-05: qty 2

## 2013-07-05 MED ORDER — FENTANYL CITRATE 0.05 MG/ML IJ SOLN: 50.0000 ug | Freq: Once | INTRAMUSCULAR | Status: DC

## 2013-07-05 MED ORDER — SODIUM CHLORIDE 0.9 % IV SOLN
Freq: Once | INTRAVENOUS | Status: AC
  Administered 2013-07-05: 1000 mL/h via INTRAVENOUS

## 2013-07-05 MED ORDER — PANTOPRAZOLE SODIUM 40 MG IV SOLR
40.0000 mg | INTRAVENOUS | Status: DC
  Administered 2013-07-05 – 2013-07-16 (×12): 40 mg via INTRAVENOUS
  Filled 2013-07-05 (×14): qty 40

## 2013-07-05 MED ORDER — DEXTROSE-NACL 5-0.45 % IV SOLN
INTRAVENOUS | Status: DC
  Administered 2013-07-05 (×2): 125 mL/h via INTRAVENOUS

## 2013-07-05 MED ORDER — PIPERACILLIN-TAZOBACTAM 3.375 G IVPB 30 MIN
3.3750 g | Freq: Once | INTRAVENOUS | Status: AC
  Administered 2013-07-05: 3.375 g via INTRAVENOUS
  Filled 2013-07-05: qty 50

## 2013-07-05 MED ORDER — ONDANSETRON HCL 4 MG/2ML IJ SOLN: 4.0000 mg | Freq: Four times a day (QID) | INTRAMUSCULAR | Status: DC | PRN

## 2013-07-05 MED ORDER — FLUCONAZOLE IN SODIUM CHLORIDE 400-0.9 MG/200ML-% IV SOLN
400.0000 mg | INTRAVENOUS | Status: DC
  Administered 2013-07-05 – 2013-07-06 (×2): 400 mg via INTRAVENOUS
  Filled 2013-07-05 (×3): qty 200

## 2013-07-05 MED ORDER — LIDOCAINE HCL (CARDIAC) 20 MG/ML IV SOLN
INTRAVENOUS | Status: DC | PRN
  Administered 2013-07-05: 60 mg via INTRAVENOUS

## 2013-07-05 MED ORDER — SODIUM CHLORIDE 0.9 % IV BOLUS (SEPSIS): 500.0000 mL | Freq: Once | INTRAVENOUS | Status: DC

## 2013-07-05 MED ORDER — LEVOTHYROXINE SODIUM 100 MCG IV SOLR
25.0000 ug | Freq: Every day | INTRAVENOUS | Status: DC
  Administered 2013-07-05 – 2013-07-12 (×8): 25 ug via INTRAVENOUS
  Filled 2013-07-05 (×8): qty 5

## 2013-07-05 MED ORDER — DEXTROSE 50 % IV SOLN: 50.0000 mL | Freq: Once | INTRAVENOUS | Status: AC | PRN

## 2013-07-05 MED ORDER — PHENYLEPHRINE HCL 10 MG/ML IJ SOLN
10.0000 mg | INTRAMUSCULAR | Status: DC | PRN
  Administered 2013-07-05: 50 ug/min via INTRAVENOUS

## 2013-07-05 MED ORDER — MIDAZOLAM HCL 5 MG/5ML IJ SOLN
INTRAMUSCULAR | Status: DC | PRN
  Administered 2013-07-05: 2 mg via INTRAVENOUS
  Administered 2013-07-05 (×2): 0.5 mg via INTRAVENOUS

## 2013-07-05 MED ORDER — METRONIDAZOLE IN NACL 5-0.79 MG/ML-% IV SOLN
500.0000 mg | Freq: Once | INTRAVENOUS | Status: AC
  Administered 2013-07-05: 500 mg via INTRAVENOUS
  Filled 2013-07-05: qty 100

## 2013-07-05 MED ORDER — FENTANYL CITRATE 0.05 MG/ML IJ SOLN
0.0000 ug/h | INTRAMUSCULAR | Status: DC
  Administered 2013-07-05 – 2013-07-06 (×2): 50 ug/h via INTRAVENOUS
  Filled 2013-07-05 (×5): qty 50

## 2013-07-05 MED ORDER — SODIUM CHLORIDE 0.9 % IV SOLN
INTRAVENOUS | Status: DC
  Administered 2013-07-05: 125 mL/h via INTRAVENOUS

## 2013-07-05 MED ORDER — PROPOFOL 10 MG/ML IV BOLUS
INTRAVENOUS | Status: DC | PRN
  Administered 2013-07-05: 200 mg via INTRAVENOUS

## 2013-07-05 MED ORDER — LACTATED RINGERS IV SOLN
INTRAVENOUS | Status: DC | PRN
  Administered 2013-07-05 (×3): via INTRAVENOUS

## 2013-07-05 MED ORDER — 0.9 % SODIUM CHLORIDE (POUR BTL) OPTIME
TOPICAL | Status: DC | PRN
  Administered 2013-07-05: 1000 mL

## 2013-07-05 MED ORDER — INSULIN ASPART 100 UNIT/ML ~~LOC~~ SOLN
0.0000 [IU] | SUBCUTANEOUS | Status: DC
  Administered 2013-07-06: 2 [IU] via SUBCUTANEOUS
  Administered 2013-07-10: 3 [IU] via SUBCUTANEOUS
  Administered 2013-07-11 – 2013-07-25 (×7): 2 [IU] via SUBCUTANEOUS
  Administered 2013-07-25: 3 [IU] via SUBCUTANEOUS

## 2013-07-05 MED ORDER — ARTIFICIAL TEARS OP OINT
TOPICAL_OINTMENT | OPHTHALMIC | Status: DC | PRN
  Administered 2013-07-05: 1 via OPHTHALMIC

## 2013-07-05 MED ORDER — FENTANYL CITRATE 0.05 MG/ML IJ SOLN: 50.0000 ug | INTRAMUSCULAR | Status: DC | PRN

## 2013-07-05 MED ORDER — SODIUM CHLORIDE 0.9 % IV BOLUS (SEPSIS)
1000.0000 mL | Freq: Once | INTRAVENOUS | Status: AC
  Administered 2013-07-05: 1000 mL via INTRAVENOUS

## 2013-07-05 MED ORDER — SUCCINYLCHOLINE CHLORIDE 20 MG/ML IJ SOLN
INTRAMUSCULAR | Status: DC | PRN
  Administered 2013-07-05: 100 mg via INTRAVENOUS

## 2013-07-05 MED ORDER — HYDROMORPHONE HCL PF 1 MG/ML IJ SOLN
1.0000 mg | Freq: Once | INTRAMUSCULAR | Status: AC
  Administered 2013-07-05: 1 mg via INTRAVENOUS
  Filled 2013-07-05: qty 1

## 2013-07-05 MED ORDER — PHENYLEPHRINE HCL 10 MG/ML IJ SOLN: 10.0000 mg | INTRAMUSCULAR | Status: DC | PRN

## 2013-07-05 MED ORDER — FENTANYL BOLUS VIA INFUSION
25.0000 ug | INTRAVENOUS | Status: DC | PRN
  Filled 2013-07-05: qty 50

## 2013-07-05 MED ORDER — HEPARIN SODIUM (PORCINE) 5000 UNIT/ML IJ SOLN
5000.0000 [IU] | Freq: Three times a day (TID) | INTRAMUSCULAR | Status: DC
  Administered 2013-07-05 – 2013-07-25 (×55): 5000 [IU] via SUBCUTANEOUS
  Filled 2013-07-05 (×72): qty 1

## 2013-07-05 MED ORDER — DEXTROSE 5 % IV SOLN
30.0000 ug/min | INTRAVENOUS | Status: DC
  Administered 2013-07-05: 50 ug/min via INTRAVENOUS
  Administered 2013-07-05: 100 ug/min via INTRAVENOUS
  Administered 2013-07-05: 15 ug/min via INTRAVENOUS
  Administered 2013-07-05: 60 ug/min via INTRAVENOUS
  Filled 2013-07-05 (×6): qty 1

## 2013-07-05 MED ORDER — EPINEPHRINE HCL 0.1 MG/ML IJ SOSY
PREFILLED_SYRINGE | INTRAMUSCULAR | Status: DC | PRN
  Administered 2013-07-05: 0.3 mg via INTRAVENOUS

## 2013-07-05 SURGICAL SUPPLY — 58 items
BAG URINE DRAINAGE (UROLOGICAL SUPPLIES) ×3 IMPLANT
BANDAGE GAUZE ELAST BULKY 4 IN (GAUZE/BANDAGES/DRESSINGS) ×3 IMPLANT
BLADE SURG ROTATE 9660 (MISCELLANEOUS) IMPLANT
CANISTER SUCTION 2500CC (MISCELLANEOUS) ×4 IMPLANT
CATH MALECOT BARD  24FR (CATHETERS) ×2
CATH MALECOT BARD 24FR (CATHETERS) ×2 IMPLANT
CHLORAPREP W/TINT 26ML (MISCELLANEOUS) ×4 IMPLANT
COVER MAYO STAND STRL (DRAPES) IMPLANT
COVER SURGICAL LIGHT HANDLE (MISCELLANEOUS) ×4 IMPLANT
DRAIN CHANNEL 19F RND (DRAIN) ×3 IMPLANT
DRAPE LAPAROSCOPIC ABDOMINAL (DRAPES) ×4 IMPLANT
DRAPE PROXIMA HALF (DRAPES) IMPLANT
DRAPE UTILITY 15X26 W/TAPE STR (DRAPE) ×8 IMPLANT
DRAPE WARM FLUID 44X44 (DRAPE) ×4 IMPLANT
DRESSING ALLEVYN LIFE SACRUM (GAUZE/BANDAGES/DRESSINGS) ×3 IMPLANT
DRSG OPSITE POSTOP 4X10 (GAUZE/BANDAGES/DRESSINGS) IMPLANT
DRSG OPSITE POSTOP 4X8 (GAUZE/BANDAGES/DRESSINGS) IMPLANT
DRSG PAD ABDOMINAL 8X10 ST (GAUZE/BANDAGES/DRESSINGS) ×3 IMPLANT
ELECT BLADE 6.5 EXT (BLADE) IMPLANT
ELECT CAUTERY BLADE 6.4 (BLADE) ×8 IMPLANT
ELECT REM PT RETURN 9FT ADLT (ELECTROSURGICAL) ×4
ELECTRODE REM PT RTRN 9FT ADLT (ELECTROSURGICAL) ×2 IMPLANT
EVACUATOR SILICONE 100CC (DRAIN) ×3 IMPLANT
GLOVE BIO SURGEON STRL SZ7 (GLOVE) ×7 IMPLANT
GLOVE BIOGEL PI IND STRL 7.5 (GLOVE) ×2 IMPLANT
GLOVE BIOGEL PI IND STRL 8 (GLOVE) ×1 IMPLANT
GLOVE BIOGEL PI INDICATOR 7.5 (GLOVE) ×2
GLOVE BIOGEL PI INDICATOR 8 (GLOVE) ×2
GOWN STRL NON-REIN LRG LVL3 (GOWN DISPOSABLE) ×12 IMPLANT
GOWN STRL REIN 3XL LVL4 (GOWN DISPOSABLE) ×3 IMPLANT
KIT BASIN OR (CUSTOM PROCEDURE TRAY) ×4 IMPLANT
KIT ROOM TURNOVER OR (KITS) ×4 IMPLANT
LIGASURE IMPACT 36 18CM CVD LR (INSTRUMENTS) IMPLANT
NS IRRIG 1000ML POUR BTL (IV SOLUTION) ×32 IMPLANT
PACK GENERAL/GYN (CUSTOM PROCEDURE TRAY) ×4 IMPLANT
PAD ARMBOARD 7.5X6 YLW CONV (MISCELLANEOUS) ×4 IMPLANT
PENCIL BUTTON HOLSTER BLD 10FT (ELECTRODE) IMPLANT
SPECIMEN JAR LARGE (MISCELLANEOUS) IMPLANT
SPONGE GAUZE 4X4 12PLY (GAUZE/BANDAGES/DRESSINGS) ×3 IMPLANT
SPONGE LAP 18X18 X RAY DECT (DISPOSABLE) ×3 IMPLANT
STAPLER VISISTAT 35W (STAPLE) ×4 IMPLANT
SUCTION POOLE TIP (SUCTIONS) ×4 IMPLANT
SUT ETHILON 2 0 FS 18 (SUTURE) ×9 IMPLANT
SUT PDS AB 1 TP1 54 (SUTURE) IMPLANT
SUT PDS AB 1 TP1 96 (SUTURE) ×8 IMPLANT
SUT SILK 2 0 (SUTURE) ×4
SUT SILK 2 0 SH CR/8 (SUTURE) ×7 IMPLANT
SUT SILK 2-0 18XBRD TIE 12 (SUTURE) ×2 IMPLANT
SUT SILK 3 0 (SUTURE)
SUT SILK 3 0 SH CR/8 (SUTURE) ×4 IMPLANT
SUT SILK 3-0 18XBRD TIE 12 (SUTURE) ×1 IMPLANT
SUT VIC AB 3-0 SH 27 (SUTURE)
SUT VIC AB 3-0 SH 27X BRD (SUTURE) IMPLANT
TOWEL OR 17X26 10 PK STRL BLUE (TOWEL DISPOSABLE) ×4 IMPLANT
TRAY FOLEY CATH 14FRSI W/METER (CATHETERS) ×3 IMPLANT
TUBE CONNECTING 12'X1/4 (SUCTIONS)
TUBE CONNECTING 12X1/4 (SUCTIONS) IMPLANT
YANKAUER SUCT BULB TIP NO VENT (SUCTIONS) IMPLANT

## 2013-07-05 NOTE — Progress Notes (Signed)
[facility] RN updated pt status. Updated CVP now 4-6 range. Updated pt received 2LNS bolus since arrival to SICU with MIVF at 137mL/hr. Updated urine output remains marginal/decreased at 10-15cc/hr. Jackson RN to updated MD. Will continue to monitor. Reather Laurence

## 2013-07-05 NOTE — Progress Notes (Addendum)
Dr Barry Dienes updated pt status. On arrival to SICU per CRNA unable to fully advance NGT to stomach in OR but MD wanted to keep in place in addition to gastric tube to gravity, upon assessment, noted that NGT coiled in mouth and pulled out, MD ok to leave out. Orders for wet to dry dressing change BID, MD ok to start on 7p shift this evening. MD updated pt difficult airway per CRNA, plan to leave vented today and assess for wake up assessment/spontaneous breathing trial on 07/06/13, order for RASS goal -3, MD ok with RASS -1/-2, propofol gtt ordered with PRN fentanyl, pt arrived to SICU on precedex gtt. Updated CBG on arrival to unit 57 and d50 given per protocol, MD to add d5 to MIVF. Updated pt with history of raynauds per family/CRNA and pulse ox not picking up, ABG ordered to assess oxygenation status. CCM consulted. Will continue to monitor. Reather Laurence

## 2013-07-05 NOTE — Progress Notes (Signed)
Dr Barry Dienes updated pt status. Updated CCM able to place IJ central line. Updated neo gtt started and transitioning pt to fentanyl gtt from propofol gtt per CCM, no plans to extubate. Updated recent lab results (CBC, BMP, ABG). Updated minimal urine output. Updated pt with MIVF @125cc /hr. Updated CXR report post IJ placement with questionable nodule. Orders received for 1LNS bolus. Will continue to monitor. Reather Laurence

## 2013-07-05 NOTE — Anesthesia Preprocedure Evaluation (Addendum)
Anesthesia Evaluation  Patient identified by MRN, date of birth, ID band  Reviewed: Allergy & Precautions, H&P , NPO status , Patient's Chart, lab work & pertinent test results  History of Anesthesia Complications (+) DIFFICULT AIRWAY  Airway Mallampati: III  Neck ROM: limited  Mouth opening: Limited Mouth Opening  Dental   Pulmonary Current Smoker,          Cardiovascular hypertension,     Neuro/Psych  Headaches, Anxiety    GI/Hepatic   Endo/Other  Hypothyroidism   Renal/GU      Musculoskeletal   Abdominal   Peds  Hematology   Anesthesia Other Findings Hx oropharyngeal CA s/p chemo &radiation  Reproductive/Obstetrics                       Anesthesia Physical Anesthesia Plan  ASA: III and emergent  Anesthesia Plan: General   Post-op Pain Management:    Induction: Intravenous, Rapid sequence and Cricoid pressure planned  Airway Management Planned: Oral ETT and Video Laryngoscope Planned  Additional Equipment:   Intra-op Plan:   Post-operative Plan: Possible Post-op intubation/ventilation  Informed Consent: I have reviewed the patients History and Physical, chart, labs and discussed the procedure including the risks, benefits and alternatives for the proposed anesthesia with the patient or authorized representative who has indicated his/her understanding and acceptance.   Dental advisory given  Plan Discussed with: Anesthesiologist, CRNA and Surgeon  Anesthesia Plan Comments:        Anesthesia Quick Evaluation

## 2013-07-05 NOTE — Consult Note (Addendum)
PULMONARY / CRITICAL CARE MEDICINE  Name: Taylor [name] MRN: [id number] DOB: 1946-05-06    ADMISSION DATE:  07/04/2013 CONSULTATION DATE:  07/05/12  REFERRING MD :  CCS PRIMARY SERVICE: CCS  CHIEF COMPLAINT:  Respiratory failure  BRIEF PATIENT DESCRIPTION: 68 yo admitted 1/3 with perforated jejunum requiring emergent laparotomy.  PCCM consulted for post-op medical / vent management.   SIGNIFICANT EVENTS / STUDIES:  1/4  CT abdomen >>> Perforated viscus, most likely the thickened distal duodenum. There is pneumoperitoneum and extraluminal enteric contents. Gallstones or gallbladder sludge.  LINES / TUBES: OETT 1/3 >>>  L brach A-lline 1/4 >>> 1/4 R IJ TLC 1/4>>> Foley 1/3 >>>  CULTURES: 1/4  MRSA PCR >>>  ANTIBIOTICS: Zosyn 1/3 >>> Fluconazole 1/3 >>> Flagyl 1/4 >>> 1/5  INTERVAL HISTORY:  Transfused 2 U PRBC.  VITAL SIGNS: Temp:  [98.7 F (37.1 C)-100.5 F (38.1 C)] 99.2 F (37.3 C) (01/05 1145) Pulse Rate:  [25-106] 96 (01/05 1200) Resp:  [9-27] 14 (01/05 1200) BP: (69-165)/(25-123) 124/59 mmHg (01/05 1200) SpO2:  [89 %-100 %] 100 % (01/05 1200) Arterial Line BP: (59-114)/(41-86) 78/69 mmHg (01/04 1700) FiO2 (%):  [40 %] 40 % (01/05 1200) Weight:  [58 kg (127 lb 13.9 oz)] 58 kg (127 lb 13.9 oz) (01/05 0600) HEMODYNAMICS: CVP:  [4 mmHg-11 mmHg] 6 mmHg VENTILATOR SETTINGS: Vent Mode:  [-] PRVC FiO2 (%):  [40 %] 40 % Set Rate:  [24 bmp] 24 bmp Vt Set:  [450 mL] 450 mL PEEP:  [5 cmH20] 5 cmH20 Plateau Pressure:  [18 cmH20-23 cmH20] 20 cmH20 INTAKE / OUTPUT: Intake/Output     01/04 0701 - 01/05 0700 01/05 0701 - 01/06 0700   I.V. (mL/kg) 5897.9 (101.7) 666 (11.5)   Blood  325   IV Piggyback 1300 250   Total Intake(mL/kg) 7197.9 (124.1) 1241 (21.4)   Urine (mL/kg/hr) 590 (0.4) 140 (0.5)   Drains 840 (0.6) 50 (0.2)   Total Output 1430 190   Net +5767.9 +1051         PHYSICAL EXAMINATION: General:  Mechanically ventilated, synchronous Neuro:  Awake,  alert, following comments HEENT:  OETT Cardiovascular:  Regular, no murmurs Lungs:  Bilateral air entry Abdomen:  Soft, abd dressing c/d/i, J-tube, JP drain Musculoskeletal:  Moves all extremities Skin:  Warm/dry, no edema  LABS:  CBC  Recent Labs Lab 07/04/13 2245 07/05/13 0848 07/06/13 0545 07/06/13 0630  WBC 5.6 2.2* 10.2  --   HGB 12.3 8.4* 7.1* 7.0*  HCT 35.2* 24.2* 20.7* 20.5*  PLT 709* 434* 356  --    Coag's  Recent Labs Lab 07/05/13 0848  INR 1.49   BMET  Recent Labs Lab 07/04/13 2245 07/05/13 0848 07/06/13 0545  NA 125* 127* 124*  K 4.0 4.1 4.4  CL 81* 96 95*  CO2 22 19 15*  BUN 23 29* 33*  CREATININE 1.63* 1.69* 1.99*  GLUCOSE 128* 82 109*   Electrolytes  Recent Labs Lab 07/04/13 2245 07/05/13 0848 07/06/13 0545  CALCIUM 9.5 6.8* 7.0*  MG  --   --  1.9   Sepsis Markers  Recent Labs Lab 07/05/13 1200 07/05/13 1612  LATICACIDVEN 1.9 1.8   ABG  Recent Labs Lab 07/05/13 1211 07/05/13 1543 07/06/13 0348  PHART 7.271* 7.242* 7.226*  PCO2ART 33.0* 38.5 36.6  PO2ART 114.0* 102.0* 120.0*   Liver Enzymes  Recent Labs Lab 07/04/13 2245  AST 20  ALT 9  ALKPHOS 68  BILITOT 0.4  ALBUMIN 3.0*   Cardiac Enzymes  Recent Labs Lab 07/05/13 0930  TROPONINI <0.30   Glucose  Recent Labs Lab 07/05/13 1720 07/05/13 2017 07/06/13 0030 07/06/13 0035 07/06/13 0542 07/06/13 0742  GLUCAP 112* 115* 62* 126* 111* 105*   CXR:  1/5 >>> Hardware in good position, bilateral effusions  ASSESSMENT / PLAN:  PULMONARY A: Acute respiratory failure.  Difficult airway. Hx of laryngeal carcinoma s/p chemo / XRT Tobacco abuse. P:   Goal pH>7.30, SpO2>92 Continuous mechanical support VAP bundle Daily SBT Trend ABG/CXR Difficult airway cart  / Anesthesia? at bedside before extubation  CARDIOVASCULAR A:  Hypotension, doubt shock as lactate reassuring. Brief cardiac arrest during intubation. P:  Goal MAP>60 or  SBP>90 Neo-Synephrine gtt  RENAL A:   AKI. Hyponatremia P:   Goal CVP 10-12 Trend BMP D/c D5 1/2NS NS 1000 x 1 Start bicarbonate / D5 gtt  GASTROINTESTINAL A:   Perforated jejunum, s/p laparotomy. Nutrition. GIPx. P:   NPO Protonix ? TNA ? EGD  HEMATOLOGIC A:   Acute blood loss anemia. VTE Px. P:  Trend CBC Heparin [place]  INFECTIOUS A:   Peritonitis. P:   Cx / abx as above D/c Flagyl as Zosyn has sufficient anaerobic coverage  ENDOCRINE A:   Hypothyroidism. Hyper / hypoglycemia  P:   SSI Levothyroxine   NEUROLOGIC A:   ETOH abuse. Pain / Sedation. P:   Goal RASS 0 to -1 D/c Propofol gtt Fentanyl gtt Start Thiamine / Folate  I have personally obtained history, examined patient, evaluated and interpreted laboratory and imaging results, reviewed medical records, formulated assessment / plan and placed orders.  CRITICAL CARE:  The patient is critically ill with multiple organ systems failure and requires high complexity decision making for assessment and support, frequent evaluation and titration of therapies, application of advanced monitoring technologies and extensive interpretation of multiple databases. Critical Care Time devoted to patient care services described in this note is 35 minutes.   Doree Fudge, MD [facility] [place] Pager: [phone]  07/06/2013, 12:13 PM

## 2013-07-05 NOTE — Progress Notes (Signed)
Patient ID: Taylor [name], female   DOB: 03/10/1946, 68 y.o.   MRN: [id number] Pt just out of OR within the last 2 hours.  Hypotensive and decrease UOP.  CCM following patient with [place].  Appreciate their assistance.  Will get some type of central line access and will start TNA for nutritional support.  Labs per ccm.  OG coiled in mouth and removed.  g-tube to gravity drainage.  Will likely do UGI in 3-5 days.  Roxane Puerto E 9:59 AM 07/05/2013

## 2013-07-05 NOTE — Progress Notes (Signed)
Lab called with critical results from CBC sent down this AM after pt arrival to unit. WBC count decrease from 5 range to 1 range with significant Hgb and platelet decrease. Sallye Ober NP updated on results. BMP not yet resulted. Lab to re stick for all labs sent. Reather Laurence

## 2013-07-05 NOTE — Progress Notes (Signed)
Seen, agree with above. Start TNA, get PICC.  Repeat labs later today.

## 2013-07-05 NOTE — Progress Notes (Signed)
ANTIBIOTIC CONSULT NOTE - INITIAL  Pharmacy Consult for Zosyn Indication: perforated viscus  Allergies  Allergen Reactions  . Ace Inhibitors Cough  . Hydrocodone Itching  . Hydromorphone Itching  . Tylox [Oxycodone-Acetaminophen] Itching  . Sulfa Antibiotics Itching and Rash    Patient Measurements: Height: 5' 4.17" (163 cm) Weight: 98 lb 12.3 oz (44.8 kg) IBW/kg (Calculated) : 55.1  Vital Signs: Temp: 97.6 F (36.4 C) (01/04 0039) Temp src: Oral (01/04 0039) BP: 127/76 mmHg (01/04 0039) Pulse Rate: 99 (01/04 0039) Intake/Output from previous day: 01/03 0701 - 01/04 0700 In: 2500 [I.V.:2000; IV Piggyback:500] Out: 30 [Urine:30] Intake/Output from this shift: Total I/O In: 100 [I.V.:100] Out: 50 [Urine:50]  Labs:  Recent Labs  07/04/13 2245  WBC 5.6  HGB 12.3  PLT 709*  CREATININE 1.63*   Estimated Creatinine Clearance: 23.7 ml/min (by C-G formula based on Cr of 1.63). No results found for this basename: VANCOTROUGH, VANCOPEAK, VANCORANDOM, GENTTROUGH, GENTPEAK, GENTRANDOM, TOBRATROUGH, TOBRAPEAK, TOBRARND, AMIKACINPEAK, AMIKACINTROU, AMIKACIN,  in the last 72 hours   Microbiology: No results found for this or any previous visit (from the past 720 hour(s)).  Medical History: Past Medical History  Diagnosis Date  . Hyperlipidemia     STATES SHE NO LONGER HAS HI CHOLESTEROL  . Rhinitis   . Fatigue 12/10/2011  . Hypothyroid 12/11/2011  . Hypertension     hx of, no current med for last 3 years  . Throat cancer 2010  . Breast CA 2008    right breast  . Headache(784.0)     tension  . Nausea alone 04/01/2013    Medications:  Scheduled:  . [START ON 07/06/2013] enoxaparin (LOVENOX) injection  40 mg Subcutaneous Q24H  . insulin aspart  0-15 Units Subcutaneous TID WC  . pantoprazole (PROTONIX) IV  40 mg Intravenous QHS   Infusions:  . sodium chloride    . propofol     Assessment: 68 yo F with history of throat cancer and breast cancer who presents with  ~24 hour history of worsening abdominal pain.  Pharmacy is consulted to start Zosyn.  Patient is near cut off for renal dose adjustment of Zosyn.  Her initial SCr is 1.63 with estimated CrCl ~24.  She is afebrile and WBC is wnl.  Goal of Therapy:  Resolution of possible infection  Plan:  - start Zosyn 3.375gm q8h, extended infusion - f/u kidney function and trend, will need to adjust if worsens - monitor temperature curve, WBC, any cultures, and clinical progression  Ovid Curd E. Jacqlyn Larsen, PharmD Clinical Pharmacist - Resident Pager: [phone] Pharmacy: [phone] 07/05/2013 7:41 AM

## 2013-07-05 NOTE — Procedures (Signed)
Central Venous Catheter Insertion Procedure Note Taylor [name] [id number] [birth year]  Procedure: Insertion of Central Venous Catheter Indications: Assessment of intravascular volume, Drug and/or fluid administration and Frequent blood sampling  Procedure Details Consent: Risks of procedure as well as the alternatives and risks of each were explained to the (patient/caregiver).  Consent for procedure obtained.  Time Out: Verified patient identification, verified procedure, site/side was marked, verified correct patient position, special equipment/implants available, medications/allergies/relevent history reviewed, required imaging and test results available.  Performed  Maximum sterile technique was used including antiseptics, cap, gloves, gown, hand hygiene, mask and sheet. Skin prep: Chlorhexidine; local anesthetic administered  A non-coated triple lumen catheter (allergy to sulfa) was placed in the right internal jugular vein to 16 cm (pt is 5'4) using the Seldinger technique.  Evaluation Blood flow good Complications: No apparent complications Patient did tolerate procedure well. Chest X-ray ordered to verify placement.  CXR: pending.  Procedure performed under direct supervision of Dr. Lake Bells and with ultrasound guidance for real time vessel cannulation.     Taylor Gens, NP-C [facility] Pgr: [phone] or [phone]   07/05/2013, 11:06 AM  Taylor [name] PCCM Pager: [phone] Cell: [phone] If no response, call [phone]

## 2013-07-05 NOTE — Anesthesia Postprocedure Evaluation (Signed)
  Anesthesia Post-op Note  Patient: Taylor [name]  Procedure(s) Performed: Procedure(s): EXPLORATORY LAPAROTOMY, insertion of gastrostomy tube, repair jejunal ulcer perforation. (N/A)  Patient Location: ICU  Anesthesia Type:General  Level of Consciousness: sedated  Airway and Oxygen Therapy: Patient remains intubated per anesthesia plan and Patient placed on Ventilator (see vital sign flow sheet for setting)  Post-op Pain: none  Post-op Assessment: Post-op Vital signs reviewed, Patient's Cardiovascular Status Stable, Respiratory Function Stable, Patent Airway, No signs of Nausea or vomiting and Pain level controlled  Post-op Vital Signs: Reviewed and stable  Complications: No apparent anesthesia complications

## 2013-07-05 NOTE — Consult Note (Signed)
Name: Taylor [name] MRN: [id number] DOB: [birth year]    ADMISSION DATE:  07/04/2013 CONSULTATION DATE:  07/05/12  REFERRING MD :  Dr. Donne Hazel PRIMARY SERVICE: [facility]  CHIEF COMPLAINT:  Respiratory Failure  BRIEF PATIENT DESCRIPTION: 68 y/o F admitted 1/3 with abdominal pain.  Found to have a perforated jejunum requiring emergent laparotomy.  PCCM consulted for post-op medical / vent management.   SIGNIFICANT EVENTS / STUDIES:  1/3 - Admit with abd pain, ex-lap with perforated jejunum, returned to ICU on vent  LINES / TUBES: L brachial aline 1/4>>> OETT 1/3>>> R IJ TLC 1/4>>>  CULTURES: MRSA PCR 1/4>>>  ANTIBIOTICS: Zosyn 1/3>>> Fluconazole 1/3>>> Flagyl 1/4>>>  HISTORY OF PRESENT ILLNESS:  68 y/o F, smoker / ETOH use with PMH of HLD, previous HTN (not on meds), Hypothyroidism, breast cancer (2008), oropharyngeal cancer s/p chemo / XRT (2010), radical neck dissection (2012) who presented to the [facility] ER on 1/3 with a 24 hour history of worsening abdominal pain that began with forceful vomiting.  She is a known difficult airway and carries a letter from Dr. Winfred Leeds from anesthesia regarding specifics.  Patient was taken emergently to OR.  She underwent exploratory laparotomy for primary repair of perforated jejunal interval with omental patch, gastrostomy tube placement and returned to ICU on vent post operatively.    Unable to complete ROS with patient as she is altered on vent.    PAST MEDICAL HISTORY :  Past Medical History  Diagnosis Date  . Hyperlipidemia     STATES SHE NO LONGER HAS HI CHOLESTEROL  . Rhinitis   . Fatigue 12/10/2011  . Hypothyroid 12/11/2011  . Hypertension     hx of, no current med for last 3 years  . Throat cancer 2010  . Breast CA 2008    right breast  . Headache(784.0)     tension  . Nausea alone 04/01/2013  . Difficult airway    Past Surgical History  Procedure Laterality Date  . Panendoscopy  05/16/2011    Procedure: PANENDOSCOPY;   Surgeon: Tyson Alias;  Location: [place];  Service: ENT;  Laterality: N/A;  Direct laryngoscopy, esophagoscopy  . Radical neck dissection  05/16/2011    Procedure: RADICAL NECK DISSECTION;  Surgeon: Tyson Alias;  Location: [place];  Service: ENT;  Laterality: Left;  Modified radical left neck dissection   . Appendectomy  yrs ago  . Cysts removed from back abd breast  yrs ago  . Breast lumpectomy Right 2008  . Esophagogastroduodenoscopy N/A 01/13/2013    Procedure: ESOPHAGOGASTRODUODENOSCOPY (EGD);  Surgeon: Cleotis Nipper, MD;  Location: [facility];  Service: Endoscopy;  Laterality: N/A;  . Balloon dilation N/A 01/13/2013    Procedure: BALLOON DILATION;  Surgeon: Cleotis Nipper, MD;  Location: [facility];  Service: Endoscopy;  Laterality: N/A;   Prior to Admission medications   Medication Sig Start Date End Date Taking? Authorizing Provider  acetaminophen (TYLENOL) 160 MG/5ML elixir Take 15 mg/kg by mouth every 4 (four) hours as needed for fever.   Yes Historical Provider, MD  acetaminophen (TYLENOL) 325 MG tablet Take 650 mg by mouth every 6 (six) hours as needed for pain.    Yes Historical Provider, MD  desoximetasone (TOPICORT) 0.25 % cream Apply 1 application topically 2 (two) times daily. Apply to affected area    Yes Historical Provider, MD  diclofenac (VOLTAREN) 75 MG EC tablet Take 75 mg by mouth 2 (two) times daily.  11/23/11  Yes Historical Provider, MD  levothyroxine (SYNTHROID, LEVOTHROID) 50 MCG tablet Take 1 tablet (50 mcg total) by mouth daily before breakfast. 04/01/13  Yes Heath Lark, MD  LORazepam (ATIVAN) 0.5 MG tablet Take 1 tablet (0.5 mg total) by mouth every 8 (eight) hours as needed for anxiety. 05/20/13  Yes Heath Lark, MD  magnesium hydroxide (MILK OF MAGNESIA) 800 MG/5ML suspension Take 15 mLs by mouth daily as needed for constipation.   Yes Historical Provider, MD  omeprazole (PRILOSEC) 20 MG capsule Take 1 capsule (20 mg total) by mouth daily. 06/15/13  Yes Heath Lark, MD  ondansetron (ZOFRAN) 8 MG tablet Take 1 tablet (8 mg total) by mouth every 8 (eight) hours as needed for nausea. 04/01/13  Yes Heath Lark, MD  oxymetazoline (AFRIN) 0.05 % nasal spray Place 1 spray into the nose 2 (two) times daily.   Yes Historical Provider, MD  Pseudoephed-APAP-Guaifenesin (SUDAFED TRIPLE ACTION PO) Take 1 tablet by mouth 2 (two) times daily.   Yes Historical Provider, MD  simethicone (MYLICON) 194 MG chewable tablet Chew 125 mg by mouth every 6 (six) hours as needed for flatulence.   Yes Historical Provider, MD   Allergies  Allergen Reactions  . Ace Inhibitors Cough  . Hydrocodone Itching  . Hydromorphone Itching  . Tylox [Oxycodone-Acetaminophen] Itching  . Sulfa Antibiotics Itching and Rash    FAMILY HISTORY:  Family History  Problem Relation Age of Onset  . Hypertension    . Throat cancer    . Rheum arthritis    . Hypertension Mother   . Hyperlipidemia Mother   . Throat cancer Father    SOCIAL HISTORY:  reports that she has been smoking Cigarettes.  She has a 80 pack-year smoking history. She has never used smokeless tobacco. She reports that she drinks alcohol. She reports that she does not use illicit drugs.  REVIEW OF SYSTEMS:  Unable to complete as pt is altered on vent.   SUBJECTIVE:   VITAL SIGNS: Temp:  [97.6 F (36.4 C)-97.8 F (36.6 C)] 97.8 F (36.6 C) (01/04 0745) Pulse Rate:  [40-104] 95 (01/04 0915) Resp:  [14-33] 17 (01/04 1000) BP: (78-127)/(36-76) 88/56 mmHg (01/04 1000) SpO2:  [90 %-100 %] 100 % (01/04 0915) Arterial Line BP: (85-106)/(40-48) 93/47 mmHg (01/04 1000) FiO2 (%):  [40 %-100 %] 40 % (01/04 1000) Weight:  [98 lb 12.3 oz (44.8 kg)] 98 lb 12.3 oz (44.8 kg) (01/04 0700) HEMODYNAMICS:   VENTILATOR SETTINGS: Vent Mode:  [-] PRVC FiO2 (%):  [40 %-100 %] 40 % Set Rate:  [14 bmp-16 bmp] 16 bmp Vt Set:  [450 mL] 450 mL PEEP:  [5 cmH20] 5 cmH20 Plateau Pressure:  [15 cmH20] 15 cmH20 INTAKE /  OUTPUT: Intake/Output     01/03 0701 - 01/04 0700 01/04 0701 - 01/05 0700   I.V. (mL/kg) 2000 (44.6) 1172.5 (26.2)   IV Piggyback 500 200   Total Intake(mL/kg) 2500 (55.8) 1372.5 (30.6)   Urine (mL/kg/hr) 30 150 (0.8)   Drains  225 (1.2)   Total Output 30 375   Net +2470 +997.5          PHYSICAL EXAMINATION: General:  Thin, adult female in NAD on vent Neuro:  Sedate, arouses, appropriate & drifts back to sleep HEENT:  Mm pink/moist, no jvd, firm neck s/p XRT Cardiovascular:  s1s2 rrr, no m/r/g Lungs:  resp's even/non-labored, lungs bilaterally clear Abdomen:  Flat, soft, abd dressing c/d/i, J-tube to bedside drainage, JP drain with minimal drainage Musculoskeletal:  No acute deformities Skin:  Warm/dry, no edema  LABS:  CBC  Recent Labs Lab 07/04/13 2245  WBC 5.6  HGB 12.3  HCT 35.2*  PLT 709*   Coag's No results found for this basename: APTT, INR,  in the last 168 hours BMET  Recent Labs Lab 07/04/13 2245  NA 125*  K 4.0  CL 81*  CO2 22  BUN 23  CREATININE 1.63*  GLUCOSE 128*   Electrolytes  Recent Labs Lab 07/04/13 2245  CALCIUM 9.5   Sepsis Markers No results found for this basename: LATICACIDVEN, PROCALCITON, O2SATVEN,  in the last 168 hours ABG  Recent Labs Lab 07/05/13 0835  PHART 7.217*  PCO2ART 49.8*  PO2ART 465.0*   Liver Enzymes  Recent Labs Lab 07/04/13 2245  AST 20  ALT 9  ALKPHOS 68  BILITOT 0.4  ALBUMIN 3.0*   Cardiac Enzymes No results found for this basename: TROPONINI, PROBNP,  in the last 168 hours Glucose  Recent Labs Lab 07/05/13 0758 07/05/13 0831  GLUCAP 57* 213*    Imaging Ct Abdomen Pelvis Wo Contrast  07/05/2013   CLINICAL DATA:  Abdominal pain and weakness.  Vomiting  EXAM: CT ABDOMEN AND PELVIS WITHOUT CONTRAST  TECHNIQUE: Multidetector CT imaging of the abdomen and pelvis was performed following the standard protocol without intravenous contrast.  COMPARISON:  02/21/2009  FINDINGS: BODY WALL:  Unremarkable.  LOWER CHEST: Coronary atherosclerosis. Small right pleural effusion. Subpleural reticulation in the anterior right lung. There may be mild interstitial pulmonary edema at the bases.  ABDOMEN/PELVIS:  Liver: Granulomatous changes.  Biliary: Gallstones or sludge. No evidence of gallbladder inflammation.  Pancreas: Unremarkable.  Spleen: Granulomatous changes.  Adrenals: No acute findings.  Kidneys and ureters: No hydronephrosis or stone.  Bladder: Thick wall, which may be from decompressed state.  Reproductive: Calcified 3.3 cm uterine fibroid. There may be a small right adnexal cyst (approximately 12 mm).  Bowel: Bowel perforation with pneumoperitoneum and contrast spillage. The source appears to be the distal duodenum, which is circumferentially thickened, and may have diverticula. The greater curvature of the stomach also shows wall thickening, but appears intact.  Retroperitoneum: No mass or adenopathy.  Peritoneum: Moderate ascites, with free spillage of enteric contrast. The contrast has accumulated in the left upper quadrant. Pneumoperitoneum.  Vascular: No acute abnormality.  OSSEOUS: No acute abnormalities. Advanced bilateral hip osteoarthritis, right worse than left.  Critical Value/emergent results were called by telephone at the time of interpretation on 07/05/2013 at 2:25 AM to Dr. Ranae Palms , who verbally acknowledged these results.  IMPRESSION: 1. Perforated viscus, most likely the thickened distal duodenum. There is pneumoperitoneum and extraluminal enteric contents. 2. Gallstones or gallbladder sludge.   Electronically Signed   By: Tiburcio Pea M.D.   On: 07/05/2013 02:30   Dg Abd Acute W/chest  07/04/2013   CLINICAL DATA:  Abdominal pain and vomiting. Throat carcinoma and breast carcinoma.  EXAM: ACUTE ABDOMEN SERIES (ABDOMEN 2 VIEW & CHEST 1 VIEW)  COMPARISON:  Chest radiograph on 05/16/2011  FINDINGS: There is no evidence of dilated bowel loops or free intraperitoneal air. No  radiopaque calculi or other significant radiographic abnormality is seen. Severe bilateral hip osteoarthritis is noted, with chronic avascular necrosis and collapse of the right femoral head.  Heart size and mediastinal contours are within normal limits. Both lungs are clear. Calcified granuloma again noted in the left midlung. No evidence of pleural effusion. Surgical clips again seen in the right axilla.  IMPRESSION: No acute findings.   Electronically Signed   By:  Earle Gell M.D.   On: 07/04/2013 23:34    ASSESSMENT / PLAN:  PULMONARY A: Acute Respiratory Failure  Respiratory Acidosis Difficult Airway Hx - intubated w pediatric scope, >50 minutes attempt time Hx of Throat CA s/p Chemo / XRT Tobacco Abuse P:   -full vent support, adjust vent settings to rr 16 -no plans for extubation until cleared from [facility] -would plan for extubation with anesthesia at bedside or in OR.  -trend CXR / ABG -repeat ABG later am  CARDIOVASCULAR A:  Hypotension Brief Cardiac Arrest - during intubation process, received epi x1 with ROSC.   P:  -monitor fluid status, 1L bolus now -CVP Q4 -monitor hemodynamic status -post op EKG -Place central line -troponin x1 -neo for MAP >65  RENAL A:   Hyponatremia Acute Kidney Injury Metabolic / Anion Gap Acidosis P:   -trend BMP -gentle hydration -ensure adequate hydration, renal perfusion -assess lactic acid  GASTROINTESTINAL A:   S/P Ex-Lap for Perforated Jejunum P:   -NPO -see ID -D51/2 NS while NPO -PPI -will likely need TNA for nutrition  HEMATOLOGIC A:   ? Lab Error - post op labs with drop in WBC, Hgb, Platelets P:  -repeat labs pending with lab stick -trend daily H/H -SCD's for DVT proph  INFECTIOUS A:   Peritonitis - in setting of perforated jejunum  P:   -abx as above -antifungal coverage with abd perf -cultures as above -monitor abd wound  ENDOCRINE A:   Hypothyroidism Hyper / Hypoglycemia  P:   -SSI  Q4  NEUROLOGIC A:   ETOH Abuse Pain / Sedation P:   -monitor for evidence of withdrawal, may require CIWA -transition off propofol (hypotension) -Fentanyl gtt for pain  Goals of Care -DNR, but only in situation of no hope for recovery  Noe Gens, NP-C [facility] Pgr: [phone] or [phone]  Attending:  I have seen and examined the patient with nurse practitioner/resident and agree with the note above.   I have personally obtained a history, examined the patient, evaluated laboratory and imaging results, formulated the assessment and plan and placed orders.  CRITICAL CARE: The patient is critically ill with multiple organ systems failure and requires high complexity decision making for assessment and support, frequent evaluation and titration of therapies, application of advanced monitoring technologies and extensive interpretation of multiple databases. Critical Care Time devoted to patient care services described in this note is 45 minutes.   Jillyn Hidden PCCM Pager: [phone] Cell: [phone] If no response, call [phone]  07/05/2013, 11:05 AM

## 2013-07-05 NOTE — ED Notes (Signed)
CT Personnel called to inform of patient having consumed as much contrast as tolerable.

## 2013-07-05 NOTE — Progress Notes (Signed)
[facility] Progress Note Patient Name: Taylor [name] DOB: July 30, 1945 MRN: [id number]  Date of Service  07/05/2013   HPI/Events of Note  Oliguria - has received a total of 3 liters of fluid - remains on pressor support with current UOP of 15 cc/hr.  CVP of 7   eICU Interventions  Plan: 500 cc bolus of NS IV for BP support and oliguria   Intervention Category Intermediate Interventions: Oliguria - evaluation and management;Hypotension - evaluation and management  DETERDING,ELIZABETH 07/05/2013, 11:36 PM

## 2013-07-05 NOTE — Progress Notes (Signed)
Dr Lake Bells updated at bedside. MD updated pt partner [name] at bedside. MD updated pt on neo gtt, left brachial aline positional/dampened waveform, titrating neo gtt based off cuff pressure. MD ok to keep aline for now for ABG draws. Updated minimal UOP. 2nd liter fluid given per Dr Barry Dienes. CVP 8-10. MD to place orders for vent changes and will re assess ABG. Updated weaning off propofol gtt and fentanyl gtt started, titrating for RASS -1. Will continue to monitor. Reather Laurence

## 2013-07-05 NOTE — Anesthesia Procedure Notes (Signed)
Procedure Name: Intubation Performed by: Lelon Perla A Patient Re-evaluated:Patient Re-evaluated prior to inductionOxygen Delivery Method: Circle system utilized Preoxygenation: Pre-oxygenation with 100% oxygen Intubation Type: IV induction Ventilation: Mask ventilation without difficulty Grade View: Grade II Tube type: Oral Number of attempts: 2 Airway Equipment and Method: Video-laryngoscopy Placement Confirmation: ETT inserted through vocal cords under direct vision,  positive ETCO2 and breath sounds checked- equal and bilateral Secured at: 23 cm Tube secured with: Tape Dental Injury: Teeth and Oropharynx as per pre-operative assessment  Difficulty Due To: Difficulty was anticipated and Difficult Airway- due to reduced neck mobility Comments: Visualization with SMALL Adult GlideScope blade Marcie Bal MD

## 2013-07-05 NOTE — Op Note (Signed)
Preoperative diagnosis: perforated viscus Postoperative diagnosis: perforated jejunum Procedure: Exploratory laparotomy, primary repair of perforated jejunal interval with omental patch, gastrostomy tube placement Surgeon: Dr Serita Grammes Assistant: Dr Fanny Skates Anesthesia: general EBL: 100 cc Drains: 19 Fr blake drain around patch Specimen: none Sponge and needle count correct times two Disposition to icu in critical condition  Indications: This is a 68 year-old female female with multiple comorbidities who presents with abdominal pain. She has peritonitis on her exam. She underwent a CT scan that showed a perforated viscus with contrast spilling out. I discussed with she and her partner to the operating room emergently for laparotomy.  Procedure: After informed consent was obtained the patient was taken to the operating room. She has a history of a difficult airway it took about 45 minutes to an hour to obtain an airway. Eventually an endotracheal tube was placed with a pediatric glide scope. She had sequential compression devices on her legs. She was given Zosyn prior to beginning. Her abdomen was then prepped and draped in the standard sterile surgical fashion. A surgical timeout was performed.  A Foley catheter and nasogastric tube had previously been placed. I made a midline incision. Just prior to doing this we lost the blood pressure and she did receive one dose of epinephrine we could not palpate a pulse.I entered into her abdomen with return of purulence and bile. I then explored all 4 quadrants. After I explored all 4 quadrants and ran her entire bowel from her stomach all the way to the rectum there was one perforation of the jejunum right near the ligament of Treitz. This was actively spilling bile. I then took down her stomach where it was still attached to her abdominal wall from her prior gastrostomy site. I then placed 2 2-0 silk sutures in a pursestring fashion. I then made a  hole in her left upper quadrant and placed a 24 [place] Malecot tube. I then placed this inside the stomach and secured this with a pursestring sutures. I then sutured this to the abdominal wall with 2-0 silk suture in a standard fashion. Once I was done with this I went back down and I released the mesenteric attachments also able to view this perforation the ligament of Treitz. We decided eventually to oversew this with 2-0 silk suture. I then mobilized a tongue of omentum placed this over there and place an additional row sutures to secure this. We both thought that it would be better to do this than resect this area given her overall condition.  There was no obvious cause of perforation I then placed a 19 [place] Blake drain near this. Irrigation was performed with liters of saline. We then closed the abdomen with #1 looped PDS. The wounds left open and packed. I secured the drains with 2-0 nylon suture. She had an arterial line placed at completion. She is going to remain intubated and transferred to the ICU.

## 2013-07-05 NOTE — Progress Notes (Signed)
Patient at this time currently having decreased UOP (15cc/hr), and trending down after last bolus. Total of 3 Liters of fluid given during day for decreased UOP. Elink MD called at this time, new orders obtained. Will continue to monitor the patient closely.

## 2013-07-05 NOTE — Progress Notes (Signed)
CBG (finger stick) 57. Will follow hypoglycemic protocol and give D50 and re check. Reather Laurence

## 2013-07-05 NOTE — Transfer of Care (Signed)
Immediate Anesthesia Transfer of Care Note  Patient: Taylor [name]  Procedure(s) Performed: Procedure(s): EXPLORATORY LAPAROTOMY, insertion of gastrostomy tube, repair jejunal ulcer perforation. (N/A)  Patient Location: SICU  Anesthesia Type:General  Level of Consciousness: Patient remains intubated per anesthesia plan  Airway & Oxygen Therapy: Patient placed on Ventilator (see vital sign flow sheet for setting)  Post-op Assessment: Report given to PACU RN and Post -op Vital signs reviewed and stable  Post vital signs: Reviewed and stable  Complications: No apparent anesthesia complications

## 2013-07-05 NOTE — Progress Notes (Signed)
Dr Earnest Conroy (CCM) updated most recent ABG results. Updated pt with left brachial aline, aline positional/dampened, unable to draw last ABG from aline, RT had to stick for ABG. Pt with history of Raynauds and fingers cool, left hand slightly more cool than right. Palpable pulse. MD ok to d/c aline. MD updated pt with history of breast CA to right side, with lymph node removal per family, Noe Gens NP aware cuff pressures in right arm while placing central line, MD updated cuff pressures significantly lower in leg, and unable to get a reading in left forearm. MD ok to continue BP cuff readings in right upper arm. Will continue to monitor. Reather Laurence

## 2013-07-05 NOTE — Progress Notes (Signed)
Sallye Ober NP updated repeat CBG result after D50 given for hyoglycemic event. Pt was 57, now 213. NP to review and or modify current sliding scale orders. Reather Laurence

## 2013-07-05 NOTE — ED Provider Notes (Signed)
Patient's CT with evidence of perforated viscus. Discussed with Dr. Donne Hazel. He is aware of the patient's CT results and will see the patient in the emergency department. I started the patient on broad-spectrum antibiotics. Her pain medications being re-dosed. Her vital signs remain stable in the emergency department.  Dr. Donne Hazel in the emergency department and will take to the patient to the OR.   Julianne Rice, MD 07/06/13 0100

## 2013-07-05 NOTE — H&P (Addendum)
Taylor [name] is an 68 y.o. female.   Chief Complaint: abdominal pain, consult from Dr Lita Mains HPI: 23 yof with history of throat cancer, breast cancer who presents with about 24 hour history of abdominal pain that was worsening. She tried multiple home remedies without any help.  This all began with forceful emesis.  She has been unable to keep anything down.  She denies fevers.  She states her abdomen hurts to even move.  She is present with her partner [name]. 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 She also has history of breast cancer treated with lump/alnd, some chemo and xrt followed by ae therapy. Has dilation of esophageal ring in 2014.  Has letter from Dr Winfred Leeds of anesthesia that states she is difficult airway.    Past Medical History  Diagnosis Date  . Hyperlipidemia     STATES SHE NO LONGER HAS HI CHOLESTEROL  . Rhinitis   . Fatigue 12/10/2011  . Hypothyroid 12/11/2011  . Hypertension     hx of, no current med for last 3 years  . Throat cancer 2010  . Breast CA 2008    right breast  . Headache(784.0)     tension  . Nausea alone 04/01/2013    Past Surgical History  Procedure Laterality Date  . Panendoscopy  05/16/2011    Procedure: PANENDOSCOPY;  Surgeon: Tyson Alias;  Location: [place];  Service: ENT;  Laterality: N/A;  Direct laryngoscopy, esophagoscopy  . Radical neck dissection  05/16/2011    Procedure: RADICAL NECK DISSECTION;  Surgeon: Tyson Alias;  Location: [facility];  Service: ENT;  Laterality: Left;  Modified radical left neck dissection   . Appendectomy  yrs ago  . Cysts removed from back abd breast  yrs ago  . Breast lumpectomy Right 2008  . Esophagogastroduodenoscopy N/A 01/13/2013    Procedure: ESOPHAGOGASTRODUODENOSCOPY (EGD);  Surgeon: Cleotis Nipper, MD;  Location: [facility];  Service: Endoscopy;  Laterality: N/A;  . Balloon  dilation N/A 01/13/2013    Procedure: BALLOON DILATION;  Surgeon: Cleotis Nipper, MD;  Location: [facility];  Service: Endoscopy;  Laterality: N/A;  PEG  Family History  Problem Relation Age of Onset  . Hypertension    . Throat cancer    . Rheum arthritis    . Hypertension Mother   . Hyperlipidemia Mother   . Throat cancer Father    Social History:  reports that she has been smoking Cigarettes.  She has a 80 pack-year smoking history. She has never used smokeless tobacco. She reports that she drinks alcohol. She reports that she does not use illicit drugs. She drinks a 12 pack of beer per day Allergies:  Allergies  Allergen Reactions  . Ace Inhibitors Cough  . Hydrocodone Itching  . Hydromorphone Itching  . Tylox [Oxycodone-Acetaminophen] Itching  . Sulfa Antibiotics Itching and Rash   meds reviewed  Results for orders placed during the hospital encounter of 07/04/13 (from the past 48 hour(s))  COMPREHENSIVE METABOLIC PANEL     Status: Abnormal   Collection Time    07/04/13 10:45 PM      Result Value Range   Sodium 125 (*) 137 - 147 mEq/L   Comment: Please note change in reference range.   Potassium 4.0  3.7 - 5.3 mEq/L   Comment: Please note change in reference range.   Chloride 81 (*) 96 - 112  mEq/L   CO2 22  19 - 32 mEq/L   Glucose, Bld 128 (*) 70 - 99 mg/dL   BUN 23  6 - 23 mg/dL   Creatinine, Ser 1.63 (*) 0.50 - 1.10 mg/dL   Calcium 9.5  8.4 - 10.5 mg/dL   Total Protein 6.9  6.0 - 8.3 g/dL   Albumin 3.0 (*) 3.5 - 5.2 g/dL   AST 20  0 - 37 U/L   ALT 9  0 - 35 U/L   Alkaline Phosphatase 68  39 - 117 U/L   Total Bilirubin 0.4  0.3 - 1.2 mg/dL   GFR calc non Af Amer 32 (*) >90 mL/min   GFR calc Af Amer 37 (*) >90 mL/min   Comment: (NOTE)     The eGFR has been calculated using the CKD EPI equation.     This calculation has not been validated in all clinical situations.     eGFR's persistently <90 mL/min signify possible Chronic Kidney     Disease.  LIPASE,  BLOOD     Status: Abnormal   Collection Time    07/04/13 10:45 PM      Result Value Range   Lipase 150 (*) 11 - 59 U/L  CBC WITH DIFFERENTIAL     Status: Abnormal   Collection Time    07/04/13 10:45 PM      Result Value Range   WBC 5.6  4.0 - 10.5 K/uL   RBC 4.24  3.87 - 5.11 MIL/uL   Hemoglobin 12.3  12.0 - 15.0 g/dL   HCT 35.2 (*) 36.0 - 46.0 %   MCV 83.0  78.0 - 100.0 fL   MCH 29.0  26.0 - 34.0 pg   MCHC 34.9  30.0 - 36.0 g/dL   RDW 13.4  11.5 - 15.5 %   Platelets 709 (*) 150 - 400 K/uL   Neutrophils Relative % 86 (*) 43 - 77 %   Lymphocytes Relative 8 (*) 12 - 46 %   Monocytes Relative 5  3 - 12 %   Eosinophils Relative 1  0 - 5 %   Basophils Relative 0  0 - 1 %   Neutro Abs 4.8  1.7 - 7.7 K/uL   Lymphs Abs 0.4 (*) 0.7 - 4.0 K/uL   Monocytes Absolute 0.3  0.1 - 1.0 K/uL   Eosinophils Absolute 0.1  0.0 - 0.7 K/uL   Basophils Absolute 0.0  0.0 - 0.1 K/uL   RBC Morphology RARE NRBCs     Comment: TARGET CELLS   WBC Morphology INCREASED BANDS (>20% BANDS)     Comment: MILD LEFT SHIFT (1-5% METAS, OCC MYELO, OCC BANDS)     TOXIC GRANULATION     VACUOLATED NEUTROPHILS   Smear Review PLATELET CLUMPS NOTED ON SMEAR    POCT I-STAT TROPONIN I     Status: None   Collection Time    07/04/13 10:47 PM      Result Value Range   Troponin i, poc 0.01  0.00 - 0.08 ng/mL   Comment 3            Comment: Due to the release kinetics of cTnI,     a negative result within the first hours     of the onset of symptoms does not rule out     myocardial infarction with certainty.     If myocardial infarction is still suspected,     repeat the test at appropriate intervals.   Ct Abdomen Pelvis  Wo Contrast  07/05/2013   CLINICAL DATA:  Abdominal pain and weakness.  Vomiting  EXAM: CT ABDOMEN AND PELVIS WITHOUT CONTRAST  TECHNIQUE: Multidetector CT imaging of the abdomen and pelvis was performed following the standard protocol without intravenous contrast.  COMPARISON:  02/21/2009  FINDINGS: BODY  WALL: Unremarkable.  LOWER CHEST: Coronary atherosclerosis. Small right pleural effusion. Subpleural reticulation in the anterior right lung. There may be mild interstitial pulmonary edema at the bases.  ABDOMEN/PELVIS:  Liver: Granulomatous changes.  Biliary: Gallstones or sludge. No evidence of gallbladder inflammation.  Pancreas: Unremarkable.  Spleen: Granulomatous changes.  Adrenals: No acute findings.  Kidneys and ureters: No hydronephrosis or stone.  Bladder: Thick wall, which may be from decompressed state.  Reproductive: Calcified 3.3 cm uterine fibroid. There may be a small right adnexal cyst (approximately 12 mm).  Bowel: Bowel perforation with pneumoperitoneum and contrast spillage. The source appears to be the distal duodenum, which is circumferentially thickened, and may have diverticula. The greater curvature of the stomach also shows wall thickening, but appears intact.  Retroperitoneum: No mass or adenopathy.  Peritoneum: Moderate ascites, with free spillage of enteric contrast. The contrast has accumulated in the left upper quadrant. Pneumoperitoneum.  Vascular: No acute abnormality.  OSSEOUS: No acute abnormalities. Advanced bilateral hip osteoarthritis, right worse than left.  Critical Value/emergent results were called by telephone at the time of interpretation on 07/05/2013 at 2:25 AM to Dr. Lita Mains , who verbally acknowledged these results.  IMPRESSION: 1. Perforated viscus, most likely the thickened distal duodenum. There is pneumoperitoneum and extraluminal enteric contents. 2. Gallstones or gallbladder sludge.   Electronically Signed   By: Jorje Guild M.D.   On: 07/05/2013 02:30   Dg Abd Acute W/chest  07/04/2013   CLINICAL DATA:  Abdominal pain and vomiting. Throat carcinoma and breast carcinoma.  EXAM: ACUTE ABDOMEN SERIES (ABDOMEN 2 VIEW & CHEST 1 VIEW)  COMPARISON:  Chest radiograph on 05/16/2011  FINDINGS: There is no evidence of dilated bowel loops or free intraperitoneal air.  No radiopaque calculi or other significant radiographic abnormality is seen. Severe bilateral hip osteoarthritis is noted, with chronic avascular necrosis and collapse of the right femoral head.  Heart size and mediastinal contours are within normal limits. Both lungs are clear. Calcified granuloma again noted in the left midlung. No evidence of pleural effusion. Surgical clips again seen in the right axilla.  IMPRESSION: No acute findings.   Electronically Signed   By: Earle Gell M.D.   On: 07/04/2013 23:34    Review of Systems  Constitutional: Positive for chills and diaphoresis. Negative for fever.  Respiratory: Negative for shortness of breath.   Cardiovascular: Negative for chest pain.  Gastrointestinal: Positive for nausea, vomiting and abdominal pain.    Blood pressure 127/76, pulse 99, temperature 97.6 F (36.4 C), temperature source Oral, resp. rate 25, SpO2 97.00%. Physical Exam  Constitutional: She appears distressed.  HENT:  Head: Normocephalic and atraumatic.  Eyes: No scleral icterus.  Cardiovascular: Normal rate, regular rhythm and normal heart sounds.   Respiratory: Effort normal and breath sounds normal. She has no wheezes. She has no rales.  GI: She exhibits no distension. Bowel sounds are decreased. There is generalized tenderness. There is rigidity, rebound and guarding.    Lymphadenopathy:    She has no cervical adenopathy.  Skin: She is diaphoretic.     Assessment/Plan Perforated viscus  I am not exactly sure what is perforated but she has peritonitis and ct consistent with bowel perforation.  She  is dnr but states this is only if there is no clear hope of recovery.  I recommended emergency laparotomy for perforation.  We discussed at length the surgery, postoperative course and risks.  Risks include but are not limited to bleeding, infection, reoperation, anastomotic leakage, open wound, mi, arrythmias, stroke, dvt, pe or death.  We also discussed possible  prolonged intubation due to airway issues.  They both understand and will proceed asap to or.    Drequan Ironside 07/05/2013, 3:27 AM

## 2013-07-05 NOTE — Progress Notes (Addendum)
Sallye Ober NP updated CXR results post intubation/post central line placement. Updated ABG results from 1200 and lactic acid. Updated neo gtt infusing at 46mcg/min, sbp>90, MAP 60s. Brachial aline positional. CVP 10. NP updated CXR report post central line/intubation. Will continue to monitor. Reather Laurence

## 2013-07-06 ENCOUNTER — Inpatient Hospital Stay (HOSPITAL_COMMUNITY): Payer: Medicare Other

## 2013-07-06 DIAGNOSIS — E871 Hypo-osmolality and hyponatremia: Secondary | ICD-10-CM

## 2013-07-06 DIAGNOSIS — E039 Hypothyroidism, unspecified: Secondary | ICD-10-CM

## 2013-07-06 LAB — BASIC METABOLIC PANEL
BUN: 33 mg/dL — ABNORMAL HIGH (ref 6–23)
CHLORIDE: 95 meq/L — AB (ref 96–112)
CO2: 15 meq/L — AB (ref 19–32)
CREATININE: 1.99 mg/dL — AB (ref 0.50–1.10)
Calcium: 7 mg/dL — ABNORMAL LOW (ref 8.4–10.5)
GFR calc Af Amer: 29 mL/min — ABNORMAL LOW (ref >90)
GFR calc non Af Amer: 25 mL/min — ABNORMAL LOW (ref >90)
GLUCOSE: 109 mg/dL — AB (ref 70–99)
Potassium: 4.4 mEq/L (ref 3.7–5.3)
Sodium: 124 mEq/L — ABNORMAL LOW (ref 137–147)

## 2013-07-06 LAB — PREPARE RBC (CROSSMATCH)

## 2013-07-06 LAB — GLUCOSE, CAPILLARY
GLUCOSE-CAPILLARY: 112 mg/dL — AB (ref 70–99)
GLUCOSE-CAPILLARY: 115 mg/dL — AB (ref 70–99)
GLUCOSE-CAPILLARY: 77 mg/dL (ref 70–99)
GLUCOSE-CAPILLARY: 96 mg/dL (ref 70–99)
Glucose-Capillary: 105 mg/dL — ABNORMAL HIGH (ref 70–99)
Glucose-Capillary: 111 mg/dL — ABNORMAL HIGH (ref 70–99)
Glucose-Capillary: 126 mg/dL — ABNORMAL HIGH (ref 70–99)
Glucose-Capillary: 62 mg/dL — ABNORMAL LOW (ref 70–99)
Glucose-Capillary: 84 mg/dL (ref 70–99)

## 2013-07-06 LAB — POCT I-STAT 3, ART BLOOD GAS (G3+)
Acid-base deficit: 11 mmol/L — ABNORMAL HIGH (ref 0.0–2.0)
Acid-base deficit: 10 mmol/L — ABNORMAL HIGH (ref 0.0–2.0)
BICARBONATE: 15.8 meq/L — AB (ref 20.0–24.0)
Bicarbonate: 15 mEq/L — ABNORMAL LOW (ref 20.0–24.0)
O2 Saturation: 98 %
O2 Saturation: 98 %
PCO2 ART: 36.6 mmHg (ref 35.0–45.0)
PH ART: 7.272 — AB (ref 7.350–7.450)
PO2 ART: 120 mmHg — AB (ref 80.0–100.0)
PO2 ART: 119 mmHg — AB (ref 80.0–100.0)
Patient temperature: 100.5
TCO2: 16 mmol/L (ref 0–100)
TCO2: 17 mmol/L (ref 0–100)
pCO2 arterial: 34.3 mmHg — ABNORMAL LOW (ref 35.0–45.0)
pH, Arterial: 7.226 — ABNORMAL LOW (ref 7.350–7.450)

## 2013-07-06 LAB — CBC
HEMATOCRIT: 20.7 % — AB (ref 36.0–46.0)
HEMOGLOBIN: 7.1 g/dL — AB (ref 12.0–15.0)
MCH: 28.4 pg (ref 26.0–34.0)
MCHC: 34.3 g/dL (ref 30.0–36.0)
MCV: 82.8 fL (ref 78.0–100.0)
Platelets: 356 10*3/uL (ref 150–400)
RBC: 2.5 MIL/uL — ABNORMAL LOW (ref 3.87–5.11)
RDW: 13.4 % (ref 11.5–15.5)
WBC: 10.2 10*3/uL (ref 4.0–10.5)

## 2013-07-06 LAB — HEMOGLOBIN AND HEMATOCRIT, BLOOD
HEMATOCRIT: 20.5 % — AB (ref 36.0–46.0)
HEMOGLOBIN: 7 g/dL — AB (ref 12.0–15.0)

## 2013-07-06 LAB — MAGNESIUM: MAGNESIUM: 1.9 mg/dL (ref 1.5–2.5)

## 2013-07-06 LAB — ABO/RH: ABO/RH(D): B POS

## 2013-07-06 MED ORDER — SODIUM BICARBONATE 8.4 % IV SOLN
INTRAVENOUS | Status: DC
  Administered 2013-07-06 – 2013-07-08 (×4): via INTRAVENOUS
  Filled 2013-07-06 (×13): qty 150

## 2013-07-06 MED ORDER — FOLIC ACID 5 MG/ML IJ SOLN
1.0000 mg | Freq: Every day | INTRAMUSCULAR | Status: DC
  Administered 2013-07-06 – 2013-07-12 (×7): 1 mg via INTRAVENOUS
  Filled 2013-07-06 (×7): qty 0.2

## 2013-07-06 MED ORDER — SODIUM CHLORIDE 0.9 % IV BOLUS (SEPSIS)
1000.0000 mL | Freq: Once | INTRAVENOUS | Status: AC
  Administered 2013-07-06: 1000 mL via INTRAVENOUS

## 2013-07-06 MED ORDER — SODIUM CHLORIDE 0.9 % IV SOLN: INTRAVENOUS | Status: DC

## 2013-07-06 MED ORDER — THIAMINE HCL 100 MG/ML IJ SOLN
100.0000 mg | Freq: Every day | INTRAMUSCULAR | Status: DC
  Administered 2013-07-06 – 2013-07-12 (×7): 100 mg via INTRAVENOUS
  Filled 2013-07-06 (×7): qty 1

## 2013-07-06 MED ORDER — FLUCONAZOLE IN SODIUM CHLORIDE 200-0.9 MG/100ML-% IV SOLN
200.0000 mg | INTRAVENOUS | Status: DC
  Administered 2013-07-07 – 2013-07-17 (×11): 200 mg via INTRAVENOUS
  Filled 2013-07-06 (×11): qty 100

## 2013-07-06 NOTE — Progress Notes (Signed)
UR Completed.  Taylor [name] Jane [phone] 07/06/2013  

## 2013-07-06 NOTE — Clinical Documentation Improvement (Signed)
THIS DOCUMENT IS NOT A PERMANENT PART OF THE MEDICAL RECORD  Please update your documentation with the medical record to reflect your response to this query. If you need help knowing how to do this please call [phone].  07/06/13  Dear Dr.Wakefield Rolley Sims  In an effort to better capture your patient's severity of illness, reflect appropriate length of stay and utilization of resources, a review of the patient medical record has revealed the following indicators.    Based on your clinical judgment, please clarify and document in a progress note and/or discharge summary the clinical condition associated with the following supporting information:  In responding to this query please exercise your independent judgment.  The fact that a query is asked, does not imply that any particular answer is desired or expected.   Possible Clinical Conditions?   " Expected Acute Blood Loss Anemia  " Acute Blood Loss Anemia  " Acute on chronic blood loss anemia  " Precipitous drop in Hematocrit  " Other Condition  " Cannot Clinically Determine    Supporting Information: Anemia, multifactorial, 2 units PRBCs today, noted per 01/05 progress notes.  Diagnostics: H&H on 01/03:  12.3/35.2 H&H on 01/05:  7.0/20.5  IV fluids / plasma expanders: 01/04:  Albumin 5%: 500 ml, noted per Anesthesia record.  Reviewed: Additional documentation provided per 07/07/13 progress notes.  Thank You,  Theron Arista, Clinical Documentation Specialist: [place]

## 2013-07-06 NOTE — Progress Notes (Signed)
30 mL fentanyl gtt wasted in sink with another RN witness Anderson [place]).

## 2013-07-06 NOTE — Progress Notes (Signed)
Dr Earnest Conroy updated at bedside. MD updated pt with low grade temp this shift, tmax 100.4, 99.9 pre blood transfusion. HR 80-100s, SR with PVC. Updated weaning neo, MD ok to wean for MAP>65, SBP>90.  MD updated AM labs/ABG. MD to order bicarb gtt, updated fentanyl at 74mcg/hr, RASS -1, will follow all commands. CVP 7-9 range. 1LNS bolus ordered. MD updated UOP marginal, updated output for shift. MD ok to continue SQ heparin. Updated gastric tube to gravity, no NGT. MD updated pt family, [name], at bedside. Will continue to monitor. Reather Laurence

## 2013-07-06 NOTE — Progress Notes (Signed)
INITIAL NUTRITION ASSESSMENT  DOCUMENTATION CODES Per approved criteria  -Not Applicable   INTERVENTION:  Initiate nutrition support (EN vs TPN) within next 24-48 hours if prolonged intubation expected RD to follow for nutrition care plan  NUTRITION DIAGNOSIS: Inadequate oral intake related to inability to eat as evidenced by NPO status  Goal: Pt to meet >/= 90% of their estimated nutrition needs   Monitor:  Nutrition support initiation, respiratory status, weight, labs, I/O's  Reason for Assessment: VDRF  68 y.o. female  Admitting Dx: abdominal pain  ASSESSMENT: Patient with PMH of throat & breast cancer who presented with 24 hour history of abdominal pain; 1/4 CT abdomen showed perforated viscus, most likely the thickened distal duodenum.  Patient s/p procedure 1/4: EXPLORATORY LAPAROTOMY PRIMARY REPAIR OF PERFORATED JEJUNAL INTERVAL WITH OMENTAL PATCH GASTROSTOMY TUBE PLACEMENT  Patient is currently intubated on ventilator support -- NGT to LIS MV: 13.3 L/min Temp (24hrs), Avg:99.5 F (37.5 C), Min:98.7 F (37.1 C), Max:100.5 F (38.1 C)   Surgery note reviewed.  NGT with bilious output.  G-tube to gravity drainage.  RD unable to obtain nutrition hx or complete Nutrition Focused Physical Exam at this time for possible malnutrition identification.   Height: Ht Readings from Last 1 Encounters:  07/05/13 5' 4.17" (1.63 m)    Weight: Wt Readings from Last 1 Encounters:  07/06/13 127 lb 13.9 oz (58 kg)    Ideal Body Weight: 120 lb  % Ideal Body Weight: 105%  Wt Readings from Last 10 Encounters:  07/06/13 127 lb 13.9 oz (58 kg)  07/06/13 127 lb 13.9 oz (58 kg)  04/01/13 98 lb 11.2 oz (44.77 kg)  01/13/13 94 lb (42.638 kg)  01/13/13 94 lb (42.638 kg)  10/08/12 96 lb 14.4 oz (43.954 kg)  05/12/12 100 lb 12.8 oz (45.723 kg)  12/10/11 103 lb 12.8 oz (47.083 kg)  08/06/11 107 lb 6.4 oz (48.716 kg)  07/19/11 105 lb (47.628 kg)    Usual Body Weight: 98  lb  % Usual Body Weight: 129%  BMI:  Body mass index is 21.83 kg/(m^2).  Estimated Nutritional Needs: Kcal: 1600-1750 Protein: 85-95 gm Fluid: 1.6-1.7 L  Skin: surgical incisions (neck, sacrum, abdomen)  Diet Order: NPO  EDUCATION NEEDS: -No education needs identified at this time   Intake/Output Summary (Last 24 hours) at 07/06/13 1404 Last data filed at 07/06/13 1300  Gross per 24 hour  Intake 6259.2 ml  Output   1305 ml  Net 4954.2 ml    Labs:   Recent Labs Lab 07/04/13 2245 07/05/13 0848 07/06/13 0545  NA 125* 127* 124*  K 4.0 4.1 4.4  CL 81* 96 95*  CO2 22 19 15*  BUN 23 29* 33*  CREATININE 1.63* 1.69* 1.99*  CALCIUM 9.5 6.8* 7.0*  MG  --   --  1.9  GLUCOSE 128* 82 109*    CBG (last 3)   Recent Labs  07/06/13 0542 07/06/13 0742 07/06/13 1239  GLUCAP 111* 105* 84    Scheduled Meds: . fentaNYL  50 mcg Intravenous Once  . [START ON 07/07/2013] fluconazole (DIFLUCAN) IV  200 mg Intravenous Q24H  . folic acid  1 mg Intravenous Daily  . heparin subcutaneous  5,000 Units Subcutaneous Q8H  . insulin aspart  0-15 Units Subcutaneous Q4H  . levothyroxine  25 mcg Intravenous Daily  . pantoprazole (PROTONIX) IV  40 mg Intravenous Q24H  . piperacillin-tazobactam (ZOSYN)  IV  3.375 g Intravenous Q8H  . thiamine  100 mg Intravenous Daily  Continuous Infusions: . fentaNYL infusion INTRAVENOUS 50 mcg/hr (07/06/13 1337)  . phenylephrine (NEO-SYNEPHRINE) Adult infusion 40 mcg/min (07/06/13 1300)  .  sodium bicarbonate  infusion 1000 mL      Past Medical History  Diagnosis Date  . Hyperlipidemia     STATES SHE NO LONGER HAS HI CHOLESTEROL  . Rhinitis   . Fatigue 12/10/2011  . Hypothyroid 12/11/2011  . Hypertension     hx of, no current med for last 3 years  . Throat cancer 2010  . Breast CA 2008    right breast  . Headache(784.0)     tension  . Nausea alone 04/01/2013  . Difficult airway     Past Surgical History  Procedure Laterality Date  .  Panendoscopy  05/16/2011    Procedure: PANENDOSCOPY;  Surgeon: Tyson Alias;  Location: [facility];  Service: ENT;  Laterality: N/A;  Direct laryngoscopy, esophagoscopy  . Radical neck dissection  05/16/2011    Procedure: RADICAL NECK DISSECTION;  Surgeon: Tyson Alias;  Location: [facility];  Service: ENT;  Laterality: Left;  Modified radical left neck dissection   . Appendectomy  yrs ago  . Cysts removed from back abd breast  yrs ago  . Breast lumpectomy Right 2008  . Esophagogastroduodenoscopy N/A 01/13/2013    Procedure: ESOPHAGOGASTRODUODENOSCOPY (EGD);  Surgeon: Cleotis Nipper, MD;  Location: [facility];  Service: Endoscopy;  Laterality: N/A;  . Balloon dilation N/A 01/13/2013    Procedure: BALLOON DILATION;  Surgeon: Cleotis Nipper, MD;  Location: [facility];  Service: Endoscopy;  Laterality: N/A;    Arthur Holms, RD, LDN Pager #: [phone] After-Hours Pager #: [phone]

## 2013-07-06 NOTE — Progress Notes (Signed)
I have seen and examined the pt and agree with PA-Osborne's progress note. PRBC tx today Con't G-tube to drainage JP SS

## 2013-07-06 NOTE — Progress Notes (Signed)
Patient ID: Taylor [name], female   DOB: 1945/09/07, 68 y.o.   MRN: [id number] 1 Day Post-Op  Subjective: Pt awakens on vent.  RN reports urine output is up and down.  Responds to fluid boluses then goes back down.  Still hypotensive on 127mcg or levo.  Objective: Vital signs in last 24 hours: Temp:  [98.7 F (37.1 C)-100.5 F (38.1 C)] 99.6 F (37.6 C) (01/05 0915) Pulse Rate:  [25-99] 86 (01/05 0915) Resp:  [0-27] 24 (01/05 0915) BP: (59-146)/(25-123) 115/45 mmHg (01/05 0915) SpO2:  [17 %-100 %] 100 % (01/05 0915) Arterial Line BP: (59-114)/(38-86) 78/69 mmHg (01/04 1700) FiO2 (%):  [40 %] 40 % (01/05 0915) Weight:  [127 lb 13.9 oz (58 kg)] 127 lb 13.9 oz (58 kg) (01/05 0600)    Intake/Output from previous day: 01/04 0701 - 01/05 0700 In: 7197.9 [I.V.:5897.9; IV Piggyback:1300] Out: 1430 [Urine:590; Drains:840] Intake/Output this shift: Total I/O In: 517.3 [I.V.:392.3; IV Piggyback:125] Out: 100 [Urine:100]  PE: Abd: soft, drain with serosang output, NGT with some bilious output, incisional wound just changed, RN reports it was very clean.  Lab Results:   Recent Labs  07/05/13 0848 07/06/13 0545 07/06/13 0630  WBC 2.2* 10.2  --   HGB 8.4* 7.1* 7.0*  HCT 24.2* 20.7* 20.5*  PLT 434* 356  --    BMET  Recent Labs  07/05/13 0848 07/06/13 0545  NA 127* 124*  K 4.1 4.4  CL 96 95*  CO2 19 15*  GLUCOSE 82 109*  BUN 29* 33*  CREATININE 1.69* 1.99*  CALCIUM 6.8* 7.0*   PT/INR  Recent Labs  07/05/13 0848  LABPROT 17.6*  INR 1.49   CMP     Component Value Date/Time   NA 124* 07/06/2013 0545   NA 133* 04/01/2013 1134   NA 136 03/14/2011 0954   K 4.4 07/06/2013 0545   K 4.2 04/01/2013 1134   K 5.5* 03/14/2011 0954   CL 95* 07/06/2013 0545   CL 96* 10/08/2012 0911   CL 96* 03/14/2011 0954   CO2 15* 07/06/2013 0545   CO2 27 04/01/2013 1134   CO2 26 03/14/2011 0954   GLUCOSE 109* 07/06/2013 0545   GLUCOSE 106 04/01/2013 1134   GLUCOSE 112* 10/08/2012 0911   GLUCOSE  113 03/14/2011 0954   BUN 33* 07/06/2013 0545   BUN 9.9 04/01/2013 1134   BUN 11 03/14/2011 0954   CREATININE 1.99* 07/06/2013 0545   CREATININE 0.7 04/01/2013 1134   CREATININE 0.5* 03/14/2011 0954   CALCIUM 7.0* 07/06/2013 0545   CALCIUM 9.5 04/01/2013 1134   CALCIUM 9.5 03/14/2011 0954   PROT 6.9 07/04/2013 2245   PROT 6.9 04/01/2013 1134   PROT 8.0 03/14/2011 0954   ALBUMIN 3.0* 07/04/2013 2245   ALBUMIN 3.3* 04/01/2013 1134   AST 20 07/04/2013 2245   AST 19 04/01/2013 1134   AST 30 03/14/2011 0954   ALT 9 07/04/2013 2245   ALT 10 04/01/2013 1134   ALT 22 03/14/2011 0954   ALKPHOS 68 07/04/2013 2245   ALKPHOS 117 04/01/2013 1134   ALKPHOS 101* 03/14/2011 0954   BILITOT 0.4 07/04/2013 2245   BILITOT 0.27 04/01/2013 1134   BILITOT 0.50 03/14/2011 0954   GFRNONAA 25* 07/06/2013 0545   GFRAA 29* 07/06/2013 0545   Lipase     Component Value Date/Time   LIPASE 150* 07/04/2013 2245       Studies/Results: Ct Abdomen Pelvis Wo Contrast  07/05/2013   CLINICAL DATA:  Abdominal pain and  weakness.  Vomiting  EXAM: CT ABDOMEN AND PELVIS WITHOUT CONTRAST  TECHNIQUE: Multidetector CT imaging of the abdomen and pelvis was performed following the standard protocol without intravenous contrast.  COMPARISON:  02/21/2009  FINDINGS: BODY WALL: Unremarkable.  LOWER CHEST: Coronary atherosclerosis. Small right pleural effusion. Subpleural reticulation in the anterior right lung. There may be mild interstitial pulmonary edema at the bases.  ABDOMEN/PELVIS:  Liver: Granulomatous changes.  Biliary: Gallstones or sludge. No evidence of gallbladder inflammation.  Pancreas: Unremarkable.  Spleen: Granulomatous changes.  Adrenals: No acute findings.  Kidneys and ureters: No hydronephrosis or stone.  Bladder: Thick wall, which may be from decompressed state.  Reproductive: Calcified 3.3 cm uterine fibroid. There may be a small right adnexal cyst (approximately 12 mm).  Bowel: Bowel perforation with pneumoperitoneum and contrast spillage. The  source appears to be the distal duodenum, which is circumferentially thickened, and may have diverticula. The greater curvature of the stomach also shows wall thickening, but appears intact.  Retroperitoneum: No mass or adenopathy.  Peritoneum: Moderate ascites, with free spillage of enteric contrast. The contrast has accumulated in the left upper quadrant. Pneumoperitoneum.  Vascular: No acute abnormality.  OSSEOUS: No acute abnormalities. Advanced bilateral hip osteoarthritis, right worse than left.  Critical Value/emergent results were called by telephone at the time of interpretation on 07/05/2013 at 2:25 AM to Dr. Lita Mains , who verbally acknowledged these results.  IMPRESSION: 1. Perforated viscus, most likely the thickened distal duodenum. There is pneumoperitoneum and extraluminal enteric contents. 2. Gallstones or gallbladder sludge.   Electronically Signed   By: Jorje Guild M.D.   On: 07/05/2013 02:30   Dg Chest Port 1 View  07/06/2013   CLINICAL DATA:  Jejunal ulceration.  EXAM: PORTABLE CHEST - 1 VIEW  COMPARISON:  07/05/2013 and 05/16/2011.  FINDINGS: Endotracheal tube tip 2.5 cm above the carina.  Right central line tip mid superior vena cava level. No gross pneumothorax.  Stable appearance of biapical pleural thickening greater on the right without associated bony destruction.  Interval development of bilateral pleural effusions with basilar atelectasis. Basilar infiltrate could not be excluded in the proper clinical setting.  Pulmonary vascular congestion most notable centrally.  Granuloma left mid lung.  Post right axillary lymph node dissection.  No gross pneumothorax.  Heart size top-normal.  IMPRESSION: Interval development of bilateral pleural effusions with basilar atelectasis. Basilar infiltrate could not be excluded in the proper clinical setting.  Pulmonary vascular congestion most notable centrally.   Electronically Signed   By: Chauncey Cruel M.D.   On: 07/06/2013 07:16   Dg Chest  Port 1 View  07/05/2013   ADDENDUM REPORT: 07/05/2013 12:12  ADDENDUM: There is a 1.1 cm nodular density overlying the left lower chest. There is also a calcified granuloma in the left mid chest. The nodular structure could represent a pulmonary nodule or a nipple shadow. This area could be further evaluated with a followup study with nipple markers.  These results were called by telephone at the time of interpretation on 07/05/2013 at 12:11 PM to the patient's nurse, Anderson Malta, who verbally acknowledged these results.   Electronically Signed   By: Markus Daft M.D.   On: 07/05/2013 12:12   07/05/2013   CLINICAL DATA:  Check endotracheal tube and central line.  EXAM: PORTABLE CHEST - 1 VIEW  COMPARISON:  07/04/2013  FINDINGS: Endotracheal tube has been placed and positioned 3.2 cm above the carina. Right jugular central line tip in the lower SVC region. No evidence for a  pneumothorax. Increased patchy densities in the lower lungs may represent dependent edema and/or atelectasis. Heart size is normal. Surgical clips in the right axilla.  IMPRESSION: Endotracheal tube and central line are well positioned. Negative for a pneumothorax.  Increased densities in the lower lungs may represent atelectasis or mild edema.  Electronically Signed: By: Markus Daft M.D. On: 07/05/2013 11:52   Dg Abd Acute W/chest  07/04/2013   CLINICAL DATA:  Abdominal pain and vomiting. Throat carcinoma and breast carcinoma.  EXAM: ACUTE ABDOMEN SERIES (ABDOMEN 2 VIEW & CHEST 1 VIEW)  COMPARISON:  Chest radiograph on 05/16/2011  FINDINGS: There is no evidence of dilated bowel loops or free intraperitoneal air. No radiopaque calculi or other significant radiographic abnormality is seen. Severe bilateral hip osteoarthritis is noted, with chronic avascular necrosis and collapse of the right femoral head.  Heart size and mediastinal contours are within normal limits. Both lungs are clear. Calcified granuloma again noted in the left midlung. No evidence  of pleural effusion. Surgical clips again seen in the right axilla.  IMPRESSION: No acute findings.   Electronically Signed   By: Earle Gell M.D.   On: 07/04/2013 23:34    Anti-infectives: Anti-infectives   Start     Dose/Rate Route Frequency Ordered Stop   07/05/13 0815  fluconazole (DIFLUCAN) IVPB 400 mg     400 mg 100 mL/hr over 120 Minutes Intravenous Every 24 hours 07/05/13 0800     07/05/13 0800  piperacillin-tazobactam (ZOSYN) IVPB 3.375 g     3.375 g 12.5 mL/hr over 240 Minutes Intravenous 3 times per day 07/05/13 0742     07/05/13 0230  piperacillin-tazobactam (ZOSYN) IVPB 3.375 g     3.375 g 100 mL/hr over 30 Minutes Intravenous  Once 07/05/13 0229 07/05/13 0344   07/05/13 0230  metroNIDAZOLE (FLAGYL) IVPB 500 mg     500 mg 100 mL/hr over 60 Minutes Intravenous  Once 07/05/13 0229 07/05/13 0444       Assessment/Plan  1. POD 1, s/p ex lap with primary repair and graham patch of perforated jejunum 2. Septic shock 3. VDRF 4. ARF 5. PCM/TNA 6. Breast cancer 7. Head and neck cancer 8. H/o HTN 9. Anemia, multi-factorial  Plan: 1. Cont with g-tube to gravity drainage. 2. Completely NPO. Will plan for UGI around POD5 to determine when patient can begin some type of feedings. 3. 2 units of pRBCs today 4. Greatly appreciate all of CCMs assistance and management of this patient.    LOS: 2 days    Armonii Sieh E 07/06/2013, 9:26 AM Pager: [phone]

## 2013-07-07 LAB — GLUCOSE, CAPILLARY
GLUCOSE-CAPILLARY: 119 mg/dL — AB (ref 70–99)
GLUCOSE-CAPILLARY: 78 mg/dL (ref 70–99)
Glucose-Capillary: 102 mg/dL — ABNORMAL HIGH (ref 70–99)
Glucose-Capillary: 104 mg/dL — ABNORMAL HIGH (ref 70–99)
Glucose-Capillary: 101 mg/dL — ABNORMAL HIGH (ref 70–99)
Glucose-Capillary: 92 mg/dL (ref 70–99)

## 2013-07-07 LAB — TYPE AND SCREEN
ABO/RH(D): B POS
ANTIBODY SCREEN: NEGATIVE
UNIT DIVISION: 0
Unit division: 0

## 2013-07-07 LAB — BASIC METABOLIC PANEL
BUN: 32 mg/dL — ABNORMAL HIGH (ref 6–23)
CO2: 19 meq/L (ref 19–32)
CREATININE: 1.95 mg/dL — AB (ref 0.50–1.10)
Calcium: 7.4 mg/dL — ABNORMAL LOW (ref 8.4–10.5)
Chloride: 90 mEq/L — ABNORMAL LOW (ref 96–112)
GFR calc Af Amer: 29 mL/min — ABNORMAL LOW (ref >90)
GFR calc non Af Amer: 25 mL/min — ABNORMAL LOW (ref >90)
Glucose, Bld: 110 mg/dL — ABNORMAL HIGH (ref 70–99)
POTASSIUM: 3.4 meq/L — AB (ref 3.7–5.3)
Sodium: 123 mEq/L — ABNORMAL LOW (ref 137–147)

## 2013-07-07 LAB — CBC
HEMATOCRIT: 25.2 % — AB (ref 36.0–46.0)
Hemoglobin: 9 g/dL — ABNORMAL LOW (ref 12.0–15.0)
MCH: 29.1 pg (ref 26.0–34.0)
MCHC: 35.7 g/dL (ref 30.0–36.0)
MCV: 81.6 fL (ref 78.0–100.0)
Platelets: 281 10*3/uL (ref 150–400)
RBC: 3.09 MIL/uL — ABNORMAL LOW (ref 3.87–5.11)
RDW: 13.8 % (ref 11.5–15.5)
WBC: 12.8 10*3/uL — ABNORMAL HIGH (ref 4.0–10.5)

## 2013-07-07 MED ORDER — POTASSIUM CHLORIDE 10 MEQ/50ML IV SOLN
10.0000 meq | INTRAVENOUS | Status: AC
  Administered 2013-07-07 (×5): 10 meq via INTRAVENOUS
  Filled 2013-07-07 (×4): qty 50

## 2013-07-07 MED ORDER — POTASSIUM CHLORIDE 10 MEQ/50ML IV SOLN
INTRAVENOUS | Status: AC
  Administered 2013-07-07: 13:00:00 10 meq via INTRAVENOUS
  Filled 2013-07-07: qty 50

## 2013-07-07 MED ORDER — BIOTENE DRY MOUTH MT LIQD
15.0000 mL | Freq: Four times a day (QID) | OROMUCOSAL | Status: DC
  Administered 2013-07-07 – 2013-07-23 (×54): 15 mL via OROMUCOSAL

## 2013-07-07 MED ORDER — CHLORHEXIDINE GLUCONATE 0.12 % MT SOLN
15.0000 mL | Freq: Two times a day (BID) | OROMUCOSAL | Status: DC
  Administered 2013-07-07 – 2013-07-23 (×32): 15 mL via OROMUCOSAL
  Filled 2013-07-07 (×26): qty 15

## 2013-07-07 NOTE — Progress Notes (Signed)
2 Days Post-Op  Subjective: PT doing better today.  No acute issues overnight.  Objective: Vital signs in last 24 hours: Temp:  [98.4 F (36.9 C)-100.4 F (38 C)] 98.4 F (36.9 C) (01/06 0400) Pulse Rate:  [29-109] 89 (01/06 0700) Resp:  [4-27] 15 (01/06 0700) BP: (90-165)/(40-91) 114/47 mmHg (01/06 0700) SpO2:  [95 %-100 %] 100 % (01/06 0700) FiO2 (%):  [40 %] 40 % (01/06 0700) Weight:  [132 lb 4.4 oz (60 kg)] 132 lb 4.4 oz (60 kg) (01/06 0600)    Intake/Output from previous day: 01/05 0701 - 01/06 0700 In: 4401.2 [I.V.:2776.2; Blood:325; IV Piggyback:1300] Out: 1625 [Urine:980; Drains:645] Intake/Output this shift:    General appearance: alert and cooperative GI: soft, approp ttp, ND, active BS, Gtube to gravity, JP serous, wound c/d/u  Lab Results:   Recent Labs  07/06/13 0545 07/06/13 0630 07/07/13 0415  WBC 10.2  --  12.8*  HGB 7.1* 7.0* 9.0*  HCT 20.7* 20.5* 25.2*  PLT 356  --  281   BMET  Recent Labs  07/06/13 0545 07/07/13 0415  NA 124* 123*  K 4.4 3.4*  CL 95* 90*  CO2 15* 19  GLUCOSE 109* 110*  BUN 33* 32*  CREATININE 1.99* 1.95*  CALCIUM 7.0* 7.4*   PT/INR  Recent Labs  07/05/13 0848  LABPROT 17.6*  INR 1.49   ABG  Recent Labs  07/06/13 0348 07/06/13 1852  PHART 7.226* 7.272*  HCO3 15.0* 15.8*    Studies/Results: Dg Chest Port 1 View  07/06/2013   CLINICAL DATA:  Jejunal ulceration.  EXAM: PORTABLE CHEST - 1 VIEW  COMPARISON:  07/05/2013 and 05/16/2011.  FINDINGS: Endotracheal tube tip 2.5 cm above the carina.  Right central line tip mid superior vena cava level. No gross pneumothorax.  Stable appearance of biapical pleural thickening greater on the right without associated bony destruction.  Interval development of bilateral pleural effusions with basilar atelectasis. Basilar infiltrate could not be excluded in the proper clinical setting.  Pulmonary vascular congestion most notable centrally.  Granuloma left mid lung.  Post right  axillary lymph node dissection.  No gross pneumothorax.  Heart size top-normal.  IMPRESSION: Interval development of bilateral pleural effusions with basilar atelectasis. Basilar infiltrate could not be excluded in the proper clinical setting.  Pulmonary vascular congestion most notable centrally.   Electronically Signed   By: Chauncey Cruel M.D.   On: 07/06/2013 07:16   Dg Chest Port 1 View  07/05/2013   ADDENDUM REPORT: 07/05/2013 12:12  ADDENDUM: There is a 1.1 cm nodular density overlying the left lower chest. There is also a calcified granuloma in the left mid chest. The nodular structure could represent a pulmonary nodule or a nipple shadow. This area could be further evaluated with a followup study with nipple markers.  These results were called by telephone at the time of interpretation on 07/05/2013 at 12:11 PM to the patient's nurse, Anderson Malta, who verbally acknowledged these results.   Electronically Signed   By: Markus Daft M.D.   On: 07/05/2013 12:12   07/05/2013   CLINICAL DATA:  Check endotracheal tube and central line.  EXAM: PORTABLE CHEST - 1 VIEW  COMPARISON:  07/04/2013  FINDINGS: Endotracheal tube has been placed and positioned 3.2 cm above the carina. Right jugular central line tip in the lower SVC region. No evidence for a pneumothorax. Increased patchy densities in the lower lungs may represent dependent edema and/or atelectasis. Heart size is normal. Surgical clips in the right axilla.  IMPRESSION: Endotracheal tube and central line are well positioned. Negative for a pneumothorax.  Increased densities in the lower lungs may represent atelectasis or mild edema.  Electronically Signed: By: Markus Daft M.D. On: 07/05/2013 11:52    Anti-infectives: Anti-infectives   Start     Dose/Rate Route Frequency Ordered Stop   07/07/13 0815  fluconazole (DIFLUCAN) IVPB 200 mg     200 mg 100 mL/hr over 60 Minutes Intravenous Every 24 hours 07/06/13 1030     07/05/13 0815  fluconazole (DIFLUCAN) IVPB  400 mg  Status:  Discontinued     400 mg 100 mL/hr over 120 Minutes Intravenous Every 24 hours 07/05/13 0800 07/06/13 1030   07/05/13 0800  piperacillin-tazobactam (ZOSYN) IVPB 3.375 g     3.375 g 12.5 mL/hr over 240 Minutes Intravenous 3 times per day 07/05/13 0742     07/05/13 0230  piperacillin-tazobactam (ZOSYN) IVPB 3.375 g     3.375 g 100 mL/hr over 30 Minutes Intravenous  Once 07/05/13 0229 07/05/13 0344   07/05/13 0230  metroNIDAZOLE (FLAGYL) IVPB 500 mg     500 mg 100 mL/hr over 60 Minutes Intravenous  Once 07/05/13 0229 07/05/13 0444      Assessment/Plan: s/p Procedure(s): EXPLORATORY LAPAROTOMY, insertion of gastrostomy tube, repair jejunal ulcer perforation. (N/A) Con't abx Will order UGI in 2-3 days to assess repair con't dressing changes Vent mgmt as per CCM   LOS: 3 days    Rosario Jacks., [facility] 07/07/2013

## 2013-07-07 NOTE — Progress Notes (Signed)
In to see patient at this time and noticed that the patient throughout the night has had increased PVC's and at times Bigeminy NSR. MD Deterding notified at this time of patients am K 3.4 and yesterdays mag 1.9. New orders obtained at this time. Will continue to monitor the patient closely.

## 2013-07-07 NOTE — Progress Notes (Signed)
PULMONARY / CRITICAL CARE MEDICINE  Name: Taylor [name] MRN: [id number] DOB: Mar 19, 1946    ADMISSION DATE:  07/04/2013 CONSULTATION DATE:  07/05/12  REFERRING MD :  [facility]S PRIMARY SERVICE: [facility]  CHIEF COMPLAINT:  Respiratory failure  BRIEF PATIENT DESCRIPTION: 68 yo admitted 1/3 with perforated jejunum requiring emergent laparotomy.  PCCM consulted for post-op medical / vent management.   SIGNIFICANT EVENTS / STUDIES:  1/4  CT abdomen >>> Perforated viscus, most likely the thickened distal duodenum. There is pneumoperitoneum and extraluminal enteric contents. Gallstones or gallbladder sludge.  LINES / TUBES: OETT 1/3 >>>  L brach A-lline 1/4 >>> 1/4 R IJ TLC 1/4>>> Foley 1/3 >>>  CULTURES: 1/4  MRSA PCR >>> neg  ANTIBIOTICS: Zosyn 1/3 >>> Fluconazole 1/3 >>> Flagyl 1/4 >>> 1/5  INTERVAL HISTORY:  No overnight issues.  VITAL SIGNS: Temp:  [98.2 F (36.8 C)-100.1 F (37.8 C)] 98.2 F (36.8 C) (01/06 0807) Pulse Rate:  [29-109] 100 (01/06 0917) Resp:  [4-27] 27 (01/06 0917) BP: (90-148)/(40-91) 125/77 mmHg (01/06 0917) SpO2:  [96 %-100 %] 100 % (01/06 0917) FiO2 (%):  [40 %] 40 % (01/06 0917) Weight:  [60 kg (132 lb 4.4 oz)] 60 kg (132 lb 4.4 oz) (01/06 0600) HEMODYNAMICS: CVP:  [7 mmHg-10 mmHg] 9 mmHg VENTILATOR SETTINGS: Vent Mode:  [-] PRVC FiO2 (%):  [40 %] 40 % Set Rate:  [24 bmp] 24 bmp Vt Set:  [450 mL] 450 mL PEEP:  [5 cmH20] 5 cmH20 Plateau Pressure:  [13 [id number] cmH20] 18 cmH20 INTAKE / OUTPUT: Intake/Output     01/05 0701 - 01/06 0700 01/06 0701 - 01/07 0700   I.V. (mL/kg) 2776.2 (46.3) 205.6 (3.4)   Blood 325    IV Piggyback 1300 200   Total Intake(mL/kg) 4401.2 (73.4) 405.6 (6.8)   Urine (mL/kg/hr) 980 (0.7) 110 (0.5)   Drains 645 (0.4)    Total Output 1625 110   Net +2776.2 +295.6         PHYSICAL EXAMINATION: General:  Mechanically ventilated, synchronous Neuro:  Sleepy but arouses to stimulation HEENT:  OETT Cardiovascular:  Regular, no  murmurs Lungs:  Bilateral breath sounds Abdomen:  Soft, abd dressing c/d/i, J-tube, JP drain Musculoskeletal:  Moves all extremities Skin:  Warm/dry, no edema  LABS:  CBC  Recent Labs Lab 07/05/13 0848 07/06/13 0545 07/06/13 0630 07/07/13 0415  WBC 2.2* 10.2  --  12.8*  HGB 8.4* 7.1* 7.0* 9.0*  HCT 24.2* 20.7* 20.5* 25.2*  PLT 434* 356  --  281   Coag's  Recent Labs Lab 07/05/13 0848  INR 1.49   BMET  Recent Labs Lab 07/05/13 0848 07/06/13 0545 07/07/13 0415  NA 127* 124* 123*  K 4.1 4.4 3.4*  CL 96 95* 90*  CO2 19 15* 19  BUN 29* 33* 32*  CREATININE 1.69* 1.99* 1.95*  GLUCOSE 82 109* 110*   Electrolytes  Recent Labs Lab 07/05/13 0848 07/06/13 0545 07/07/13 0415  CALCIUM 6.8* 7.0* 7.4*  MG  --  1.9  --    Sepsis Markers  Recent Labs Lab 07/05/13 1200 07/05/13 1612  LATICACIDVEN 1.9 1.8   ABG  Recent Labs Lab 07/05/13 1543 07/06/13 0348 07/06/13 1852  PHART 7.242* 7.226* 7.272*  PCO2ART 38.5 36.6 34.3*  PO2ART 102.0* 120.0* 119.0*   Liver Enzymes  Recent Labs Lab 07/04/13 2245  AST 20  ALT 9  ALKPHOS 68  BILITOT 0.4  ALBUMIN 3.0*   Cardiac Enzymes  Recent Labs Lab 07/05/13 0930  TROPONINI <  0.30   Glucose  Recent Labs Lab 07/06/13 1610 07/06/13 2040 07/07/13 0047 07/07/13 0409 07/07/13 0803 07/07/13 0806  GLUCAP 77 96 78 102* 39* 104*   CXR:  None today  ASSESSMENT / PLAN:  PULMONARY A: Acute respiratory failure.  Difficult airway. Hx of laryngeal carcinoma s/p chemo / XRT Tobacco abuse. P:   Goal pH>7.30, SpO2>92 Continuous mechanical support Persistent metabolic acidosis limiting extubation VAP bundle Daily SBT Trend ABG/CXR Difficult airway cart  / Anesthesia? at bedside before extubation  CARDIOVASCULAR A:  Hypotension, doubt shock as lactate reassuring. Brief cardiac arrest during intubation. P:  Goal MAP>60 or SBP>90 Neo-Synephrine gtt  RENAL A:   AKI. Hyponatremia P:   Goal CVP  10-12 Trend BMP K 10 x 5 Increase bicarbonate gtt to 150  GASTROINTESTINAL A:   Perforated jejunum, s/p laparotomy. Nutrition. GIPx. P:   NPO Protonix ? TNA ? EGD  HEMATOLOGIC A:   Acute blood loss anemia. VTE Px. P:  Trend CBC Heparin [place]  INFECTIOUS A:   Peritonitis. P:   Cx / abx as above  ENDOCRINE A:   Hypothyroidism. Hyper / hypoglycemia  P:   SSI Levothyroxine   NEUROLOGIC A:   ETOH abuse. Pain / Sedation. P:   Goal RASS 0 to -1 Fentanyl gtt Start Thiamine / Folate  I have personally obtained history, examined patient, evaluated and interpreted laboratory and imaging results, reviewed medical records, formulated assessment / plan and placed orders.  CRITICAL CARE:  The patient is critically ill with multiple organ systems failure and requires high complexity decision making for assessment and support, frequent evaluation and titration of therapies, application of advanced monitoring technologies and extensive interpretation of multiple databases. Critical Care Time devoted to patient care services described in this note is 35 minutes.   Doree Fudge, MD Pulmonary and [place] Pager: [phone]  07/07/2013, 10:42 AM

## 2013-07-07 NOTE — Progress Notes (Signed)
eLink Physician-Brief Progress Note Patient Name: Taylor [name] DOB: Jan 29, 1946 MRN: [id number]  Date of Service  07/07/2013   HPI/Events of Note  hypokalemia   eICU Interventions  Potassium replaced   Intervention Category Intermediate Interventions: Electrolyte abnormality - evaluation and management  DETERDING,ELIZABETH 07/07/2013, 5:59 AM

## 2013-07-08 ENCOUNTER — Encounter (HOSPITAL_COMMUNITY): Payer: Self-pay | Admitting: General Surgery

## 2013-07-08 DIAGNOSIS — C140 Malignant neoplasm of pharynx, unspecified: Secondary | ICD-10-CM

## 2013-07-08 DIAGNOSIS — I469 Cardiac arrest, cause unspecified: Secondary | ICD-10-CM

## 2013-07-08 LAB — BASIC METABOLIC PANEL
BUN: 27 mg/dL — ABNORMAL HIGH (ref 6–23)
CALCIUM: 7.5 mg/dL — AB (ref 8.4–10.5)
CO2: 30 mEq/L (ref 19–32)
Chloride: 89 mEq/L — ABNORMAL LOW (ref 96–112)
Creatinine, Ser: 1.54 mg/dL — ABNORMAL HIGH (ref 0.50–1.10)
GFR calc Af Amer: 39 mL/min — ABNORMAL LOW (ref >90)
GFR calc non Af Amer: 34 mL/min — ABNORMAL LOW (ref >90)
GLUCOSE: 117 mg/dL — AB (ref 70–99)
POTASSIUM: 3 meq/L — AB (ref 3.7–5.3)
Sodium: 130 mEq/L — ABNORMAL LOW (ref 137–147)

## 2013-07-08 LAB — GLUCOSE, CAPILLARY
GLUCOSE-CAPILLARY: 101 mg/dL — AB (ref 70–99)
GLUCOSE-CAPILLARY: 116 mg/dL — AB (ref 70–99)
GLUCOSE-CAPILLARY: 118 mg/dL — AB (ref 70–99)
GLUCOSE-CAPILLARY: 98 mg/dL (ref 70–99)
Glucose-Capillary: 124 mg/dL — ABNORMAL HIGH (ref 70–99)
Glucose-Capillary: 115 mg/dL — ABNORMAL HIGH (ref 70–99)
Glucose-Capillary: 113 mg/dL — ABNORMAL HIGH (ref 70–99)

## 2013-07-08 LAB — CBC
HCT: 25.8 % — ABNORMAL LOW (ref 36.0–46.0)
HEMOGLOBIN: 9.2 g/dL — AB (ref 12.0–15.0)
MCH: 28.8 pg (ref 26.0–34.0)
MCHC: 35.7 g/dL (ref 30.0–36.0)
MCV: 80.9 fL (ref 78.0–100.0)
Platelets: 262 10*3/uL (ref 150–400)
RBC: 3.19 MIL/uL — AB (ref 3.87–5.11)
RDW: 14 % (ref 11.5–15.5)
WBC: 13 10*3/uL — ABNORMAL HIGH (ref 4.0–10.5)

## 2013-07-08 LAB — POCT I-STAT 3, ART BLOOD GAS (G3+)
ACID-BASE EXCESS: 9 mmol/L — AB (ref 0.0–2.0)
BICARBONATE: 33.9 meq/L — AB (ref 20.0–24.0)
O2 Saturation: 97 %
PO2 ART: 86 mmHg (ref 80.0–100.0)
Patient temperature: 98
TCO2: 35 mmol/L (ref 0–100)
pCO2 arterial: 46.2 mmHg — ABNORMAL HIGH (ref 35.0–45.0)
pH, Arterial: 7.472 — ABNORMAL HIGH (ref 7.350–7.450)

## 2013-07-08 MED ORDER — FUROSEMIDE 10 MG/ML IJ SOLN
20.0000 mg | Freq: Once | INTRAMUSCULAR | Status: AC
  Administered 2013-07-08: 20 mg via INTRAVENOUS
  Filled 2013-07-08: qty 2

## 2013-07-08 MED ORDER — FENTANYL BOLUS VIA INFUSION
50.0000 ug | INTRAVENOUS | Status: DC | PRN
  Filled 2013-07-08: qty 200

## 2013-07-08 MED ORDER — POTASSIUM CHLORIDE 10 MEQ/50ML IV SOLN
10.0000 meq | INTRAVENOUS | Status: AC
  Administered 2013-07-08 (×6): 10 meq via INTRAVENOUS
  Filled 2013-07-08 (×2): qty 50

## 2013-07-08 NOTE — Progress Notes (Signed)
eLink Physician-Brief Progress Note Patient Name: Taylor [name] DOB: 09-May-1946 MRN: [id number]  Date of Service  07/08/2013   HPI/Events of Note  hypokalemia  eICU Interventions  Potassium replaced   Intervention Category Intermediate Interventions: Electrolyte abnormality - evaluation and management  Sarissa Dern 07/08/2013, 6:41 AM

## 2013-07-08 NOTE — Progress Notes (Signed)
3 Days Post-Op  Subjective: Pt doing well. Awake, interactive  Objective: Vital signs in last 24 hours: Temp:  [98.2 F (36.8 C)-99.3 F (37.4 C)] 98.2 F (36.8 C) (01/07 0736) Pulse Rate:  [29-104] 82 (01/07 0700) Resp:  [13-27] 21 (01/07 0700) BP: (105-136)/(43-96) 105/67 mmHg (01/07 0700) SpO2:  [99 %-100 %] 100 % (01/07 0700) FiO2 (%):  [40 %] 40 % (01/07 0700)    Intake/Output from previous day: 01/06 0701 - 01/07 0700 In: 4174.2 [I.V.:3524.2; IV Piggyback:650] Out: 2670 [Urine:2045; Drains:625] Intake/Output this shift:    General appearance: alert and cooperative GI: soft, approp ttp, ND, active BS, midline wound c/d/i, JP serous  Lab Results:   Recent Labs  07/07/13 0415 07/08/13 0400  WBC 12.8* 13.0*  HGB 9.0* 9.2*  HCT 25.2* 25.8*  PLT 281 262   BMET  Recent Labs  07/07/13 0415 07/08/13 0400  NA 123* 130*  K 3.4* 3.0*  CL 90* 89*  CO2 19 30  GLUCOSE 110* 117*  BUN 32* 27*  CREATININE 1.95* 1.54*  CALCIUM 7.4* 7.5*   PT/INR  Recent Labs  07/05/13 0848  LABPROT 17.6*  INR 1.49   ABG  Recent Labs  07/06/13 0348 07/06/13 1852  PHART 7.226* 7.272*  HCO3 15.0* 15.8*    Studies/Results: No results found.  Anti-infectives: Anti-infectives   Start     Dose/Rate Route Frequency Ordered Stop   07/07/13 0815  fluconazole (DIFLUCAN) IVPB 200 mg     200 mg 100 mL/hr over 60 Minutes Intravenous Every 24 hours 07/06/13 1030     07/05/13 0815  fluconazole (DIFLUCAN) IVPB 400 mg  Status:  Discontinued     400 mg 100 mL/hr over 120 Minutes Intravenous Every 24 hours 07/05/13 0800 07/06/13 1030   07/05/13 0800  piperacillin-tazobactam (ZOSYN) IVPB 3.375 g     3.375 g 12.5 mL/hr over 240 Minutes Intravenous 3 times per day 07/05/13 0742     07/05/13 0230  piperacillin-tazobactam (ZOSYN) IVPB 3.375 g     3.375 g 100 mL/hr over 30 Minutes Intravenous  Once 07/05/13 0229 07/05/13 0344   07/05/13 0230  metroNIDAZOLE (FLAGYL) IVPB 500 mg     500 mg 100 mL/hr over 60 Minutes Intravenous  Once 07/05/13 0229 07/05/13 0444      Assessment/Plan: s/p Procedure(s): EXPLORATORY LAPAROTOMY, insertion of gastrostomy tube, repair jejunal ulcer perforation. (N/A) Con't abx Will need UGI to eval jej repair in next 1-2d Weaning as tol from vent   LOS: 4 days    Rosario Jacks., [facility] 07/08/2013

## 2013-07-08 NOTE — Progress Notes (Signed)
ANTIBIOTIC CONSULT NOTE - Follow-up  Pharmacy Consult for Zosyn Indication: perforated viscus  Allergies  Allergen Reactions  . Ace Inhibitors Cough  . Hydrocodone Itching  . Hydromorphone Itching  . Tylox [Oxycodone-Acetaminophen] Itching  . Sulfa Antibiotics Itching and Rash    Patient Measurements: Height: 5' 4.17" (163 cm) Weight: 132 lb 4.4 oz (60 kg) IBW/kg (Calculated) : 55.1  Vital Signs: Temp: 98.2 F (36.8 C) (01/07 0736) Temp src: Oral (01/07 0736) BP: 153/85 mmHg (01/07 1000) Pulse Rate: 52 (01/07 1000) Intake/Output from previous day: 01/06 0701 - 01/07 0700 In: 4174.2 [I.V.:3524.2; IV Piggyback:650] Out: 2670 [Urine:2045; Drains:625] Intake/Output from this shift: Total I/O In: 725 [I.V.:455; Other:20; IV Piggyback:250] Out: 615 [Urine:475; Drains:140]  Labs:  Recent Labs  07/06/13 0545 07/06/13 0630 07/07/13 0415 07/08/13 0400  WBC 10.2  --  12.8* 13.0*  HGB 7.1* 7.0* 9.0* 9.2*  PLT 356  --  281 262  CREATININE 1.99*  --  1.95* 1.54*   Estimated Creatinine Clearance: 30.8 ml/min (by C-G formula based on Cr of 1.54). No results found for this basename: VANCOTROUGH, Corlis Leak, VANCORANDOM, GENTTROUGH, GENTPEAK, GENTRANDOM, TOBRATROUGH, TOBRAPEAK, TOBRARND, AMIKACINPEAK, AMIKACINTROU, AMIKACIN,  in the last 72 hours   Microbiology: Recent Results (from the past 720 hour(s))  MRSA PCR SCREENING     Status: None   Collection Time    07/05/13  7:29 AM      Result Value Range Status   MRSA by PCR NEGATIVE  NEGATIVE Final   Comment:            The GeneXpert MRSA Assay (FDA     approved for NASAL specimens     only), is one component of a     comprehensive MRSA colonization     surveillance program. It is not     intended to diagnose MRSA     infection nor to guide or     monitor treatment for     MRSA infections.    Assessment: 68 yo F with history of throat cancer and breast cancer who presented with worsening abdominal pain. Found to have  a perforated jejunum and now s/p laparotomy. She continues on zosyn D#4. Pt is afebrile and WBC is 13. Scr is down to 1.54. Dose remains appropriate. She is also no fluconazole.   1/4 Zosyn>> 1/4 Flucon>>  Goal of Therapy:  Resolution of possible infection  Plan:  1. Continue zosyn 3.375gm IV Q8H (4 hr inf) 2. F/u renal fxn, C&S, clinical status   Salome Arnt, PharmD, BCPS Pager # [phone] 07/08/2013 10:45 AM

## 2013-07-08 NOTE — Progress Notes (Signed)
PULMONARY / CRITICAL CARE MEDICINE  Name: Taylor [name] MRN: [id number] DOB: 1945-09-25    ADMISSION DATE:  07/04/2013 CONSULTATION DATE:  07/05/12  REFERRING MD :  CCS PRIMARY SERVICE: CCS  CHIEF COMPLAINT:  Respiratory failure  BRIEF PATIENT DESCRIPTION: 68 yo admitted 1/3 with perforated jejunum requiring emergent laparotomy.  PCCM consulted for post-op medical / vent management.   LINES / TUBES: OETT 1/3 (prolonged and tough intubation in OR with arrest) >>>  L brach A-lline 1/4 >>> 1/4 R IJ TLC 1/4>>>  (positional, consider PICC when GFR better) Foley 1/3 >>>  CULTURES: 1/4  MRSA PCR >>> neg  ANTIBIOTICS: Zosyn 1/3 >>> Fluconazole 1/3 >>> Flagyl 1/4 >>> 1/5    SIGNIFICANT EVENTS / STUDIES:  1/4  CT abdomen >>> Perforated viscus, most likely the thickened distal duodenum. There is pneumoperitoneum and extraluminal enteric contents. Gallstones or gallbladder sludge.  07/07/13: No overnight issues.    SUBJECTIVE/OVERNIGHT/INTERVAL HX 07/08/13: RT and RN report very diffiult intubation in OR on 07/04/13 - took 1h and arrested and used size 7 tub due to layngeal tube. Currently doing well on SBT x 15 min and cuff leak positive.  But is on fent gtt  VITAL SIGNS: Temp:  [98.2 F (36.8 C)-99.3 F (37.4 C)] 98.2 F (36.8 C) (01/07 0736) Pulse Rate:  [29-104] 86 (01/07 0800) Resp:  [13-27] 21 (01/07 0800) BP: (105-141)/(43-96) 141/70 mmHg (01/07 0800) SpO2:  [99 %-100 %] 100 % (01/07 0800) FiO2 (%):  [40 %] 40 % (01/07 0800) HEMODYNAMICS: CVP:  [7 mmHg-11 mmHg] 10 mmHg VENTILATOR SETTINGS: Vent Mode:  [-] PRVC FiO2 (%):  [40 %] 40 % Set Rate:  [24 bmp] 24 bmp Vt Set:  [450 mL] 450 mL PEEP:  [5 cmH20] 5 cmH20 Plateau Pressure:  [18 cmH20-23 cmH20] 22 cmH20 INTAKE / OUTPUT: Intake/Output     01/06 0701 - 01/07 0700 01/07 0701 - 01/08 0700   I.V. (mL/kg) 3524.2 (58.7) 152.5 (2.5)   Blood     Other  20   IV Piggyback 650 150   Total Intake(mL/kg) 4174.2 (69.6)  322.5 (5.4)   Urine (mL/kg/hr) 2045 (1.4) 225 (1.8)   Drains 625 (0.4) 140 (1.1)   Total Output 2670 365   Net +1504.2 -42.5         PHYSICAL EXAMINATION: General:  Mechanically ventilated, synchronous Neuro:  RASS 0. CAM-ICU neg for delirium. Sitting on bed HEENT:  OETT Cardiovascular:  Regular, no murmurs Lungs:  Bilateral breath sounds Abdomen:  Soft, abd dressing c/d/i, J-tube, JP drain Musculoskeletal:  Moves all extremities Skin:  Warm/dry, no edema  LABS:  PULMONARY  Recent Labs Lab 07/05/13 0835 07/05/13 1211 07/05/13 1543 07/06/13 0348 07/06/13 1852  PHART 7.217* 7.271* 7.242* 7.226* 7.272*  PCO2ART 49.8* 33.0* 38.5 36.6 34.3*  PO2ART 465.0* 114.0* 102.0* 120.0* 119.0*  HCO3 20.4 15.2* 16.5* 15.0* 15.8*  TCO2 22 16 18 16 17   O2SAT 100.0 98.0 97.0 98.0 98.0    CBC  Recent Labs Lab 07/06/13 0545 07/06/13 0630 07/07/13 0415 07/08/13 0400  HGB 7.1* 7.0* 9.0* 9.2*  HCT 20.7* 20.5* 25.2* 25.8*  WBC 10.2  --  12.8* 13.0*  PLT 356  --  281 262    COAGULATION  Recent Labs Lab 07/05/13 0848  INR 1.49    CARDIAC   Recent Labs Lab 07/05/13 0930  TROPONINI <0.30   No results found for this basename: PROBNP,  in the last 168 hours   CHEMISTRY  Recent Labs Lab 07/04/13  2245 07/05/13 0848 07/06/13 0545 07/07/13 0415 07/08/13 0400  NA 125* 127* 124* 123* 130*  K 4.0 4.1 4.4 3.4* 3.0*  CL 81* 96 95* 90* 89*  CO2 22 19 15* 19 30  GLUCOSE 128* 82 109* 110* 117*  BUN 23 29* 33* 32* 27*  CREATININE 1.63* 1.69* 1.99* 1.95* 1.54*  CALCIUM 9.5 6.8* 7.0* 7.4* 7.5*  MG  --   --  1.9  --   --    Estimated Creatinine Clearance: 30.8 ml/min (by C-G formula based on Cr of 1.54).   LIVER  Recent Labs Lab 07/04/13 2245 07/05/13 0848  AST 20  --   ALT 9  --   ALKPHOS 68  --   BILITOT 0.4  --   PROT 6.9  --   ALBUMIN 3.0*  --   INR  --  1.49     INFECTIOUS  Recent Labs Lab 07/05/13 1200 07/05/13 1612  LATICACIDVEN 1.9 1.8      ENDOCRINE CBG (last 3)   Recent Labs  07/07/13 2034 07/07/13 2350 07/08/13 0350  GLUCAP 92 119* 118*         IMAGING x48h  No results found.     ASSESSMENT / PLAN:  PULMONARY A: Acute respiratory failure.  Difficult airway during intubation 07/14/13 and per report aneshtesia prefers to be present for extubation Hx of laryngeal carcinoma s/p chemo / XRT Tobacco abuse.  1/7//15: Just started SBt and doing well but effusions +/- metabolic acidosis and on continuous fent gtt might preclude extubation P:   Continuous mechanical support Check ABG for Persistent metabolic acidosis limiting extubation VAP bundle Daily SBT Trend ABG/CXR Difficult airway cart; callr Anesthesia at bedside before extubation  CARDIOVASCULAR A:  Hypotension, doubt shock as lactate reassuring. Brief cardiac arrest during intubation.  07/08/13: Off neo. AT risk for CHF due to chemoc. CXR with effusion P:  Goal MAP>60 or SBP>90 Check echo and bnp Empiric lasix   RENAL A:   AKI. Hyponatremia Non gap metabolic acidosis due to GI surgery - on bicarb gtt  P:   Goal CVP 10-12 Trend BMP K 10 x 5 Check abg and consider decreae bicarb ggtt  GASTROINTESTINAL A:   Perforated jejunum, s/p laparotomy. Nutrition. GIPx.  P:   NPO;  Protonix Feeding route to be decided one week since 07/04/2013 by CCS - TNA v Oral   HEMATOLOGIC A:   Acute blood loss anemia. VTE Px. P:  Trend CBC Heparin [place] PRBC for hgb < 7gm%  INFECTIOUS A:   Peritonitis. P:   Cx / abx as above  ENDOCRINE A:   Hypothyroidism. Hyper / hypoglycemia  P:   SSI Levothyroxine   NEUROLOGIC A:   ETOH abuse. Pain / Sedation.  07/08/13: Normal mental status on fent gttt P:   Goal RASS 0 to -1 Fentanyl gtt -> change to PRN. If fails start precedex gtt Rx Thiamine / Folate  GLOBAL 07/08/13: no family at bedside  I have personally obtained history, examined patient, evaluated and interpreted  laboratory and imaging results, reviewed medical records, formulated assessment / plan and placed orders.  CRITICAL CARE:  The patient is critically ill with multiple organ systems failure and requires high complexity decision making for assessment and support, frequent evaluation and titration of therapies, application of advanced monitoring technologies and extensive interpretation of multiple databases. Critical Care Time devoted to patient care services described in this note is 35 minutes.    Dr. Brand Males, M.D., [facility].C.P Pulmonary and  Critical Care Medicine Staff Physician [facility] Pager: [phone], If no answer or between  15:00h - 7:00h: call [phone]  07/08/2013 9:29 AM

## 2013-07-08 NOTE — Progress Notes (Signed)
  Echocardiogram 2D Echocardiogram has been performed.  Taylor [name] 07/08/2013, 3:12 PM

## 2013-07-08 NOTE — Progress Notes (Signed)
[facility] ADULT ICU REPLACEMENT PROTOCOL FOR AM LAB REPLACEMENT ONLY  The patient does not apply for the [facility] Adult ICU Electrolyte Replacment Protocol based on the criteria listed below:   1. Is GFR >/= 40 ml/min? no  Patient's GFR today is 34 2. Is urine output >/= 0.5 ml/kg/hr for the last 6 hours? no Patient's UOP is  ml/kg/hr 3. Is BUN < 60 mg/dL? yes  Patient's BUN today is 27 4. Abnormal electrolyte(s):K 3.0 5. Ordered repletion with: NA  6. If [name] panic level lab has been reported, has the CCM MD in charge been notified? yes.   Physician:  Dr Jimmy Footman  Taylor [name], Taylor [name] 07/08/2013 5:35 AM

## 2013-07-09 ENCOUNTER — Inpatient Hospital Stay (HOSPITAL_COMMUNITY): Payer: Medicare Other

## 2013-07-09 ENCOUNTER — Encounter (HOSPITAL_COMMUNITY): Payer: Self-pay | Admitting: Anesthesiology

## 2013-07-09 DIAGNOSIS — T884XXA Failed or difficult intubation, initial encounter: Secondary | ICD-10-CM

## 2013-07-09 DIAGNOSIS — N179 Acute kidney failure, unspecified: Secondary | ICD-10-CM | POA: Diagnosis not present

## 2013-07-09 DIAGNOSIS — K275 Chronic or unspecified peptic ulcer, site unspecified, with perforation: Secondary | ICD-10-CM

## 2013-07-09 DIAGNOSIS — D62 Acute posthemorrhagic anemia: Secondary | ICD-10-CM | POA: Diagnosis present

## 2013-07-09 DIAGNOSIS — Z9889 Other specified postprocedural states: Secondary | ICD-10-CM

## 2013-07-09 DIAGNOSIS — Z5189 Encounter for other specified aftercare: Secondary | ICD-10-CM

## 2013-07-09 DIAGNOSIS — E440 Moderate protein-calorie malnutrition: Secondary | ICD-10-CM | POA: Diagnosis present

## 2013-07-09 LAB — BASIC METABOLIC PANEL
BUN: 20 mg/dL (ref 6–23)
BUN: 18 mg/dL (ref 6–23)
CALCIUM: 7.6 mg/dL — AB (ref 8.4–10.5)
CHLORIDE: 88 meq/L — AB (ref 96–112)
CO2: 40 mEq/L (ref 19–32)
CO2: 37 mEq/L — ABNORMAL HIGH (ref 19–32)
CREATININE: 1.12 mg/dL — AB (ref 0.50–1.10)
Calcium: 7.3 mg/dL — ABNORMAL LOW (ref 8.4–10.5)
Chloride: 90 mEq/L — ABNORMAL LOW (ref 96–112)
Creatinine, Ser: 1.24 mg/dL — ABNORMAL HIGH (ref 0.50–1.10)
GFR calc non Af Amer: 50 mL/min — ABNORMAL LOW (ref >90)
GFR, EST AFRICAN AMERICAN: 51 mL/min — AB (ref >90)
GFR, EST AFRICAN AMERICAN: 58 mL/min — AB (ref >90)
GFR, EST NON AFRICAN AMERICAN: 44 mL/min — AB (ref >90)
Glucose, Bld: 91 mg/dL (ref 70–99)
Glucose, Bld: 62 mg/dL — ABNORMAL LOW (ref 70–99)
Potassium: 2.5 mEq/L — CL (ref 3.7–5.3)
Potassium: 3.1 mEq/L — ABNORMAL LOW (ref 3.7–5.3)
SODIUM: 136 meq/L — AB (ref 137–147)
SODIUM: 141 meq/L (ref 137–147)

## 2013-07-09 LAB — GLUCOSE, CAPILLARY
GLUCOSE-CAPILLARY: 96 mg/dL (ref 70–99)
GLUCOSE-CAPILLARY: 78 mg/dL (ref 70–99)
Glucose-Capillary: 39 mg/dL — CL (ref 70–99)
Glucose-Capillary: 96 mg/dL (ref 70–99)
Glucose-Capillary: 83 mg/dL (ref 70–99)

## 2013-07-09 LAB — CBC WITH DIFFERENTIAL/PLATELET
Basophils Absolute: 0 10*3/uL (ref 0.0–0.1)
Basophils Relative: 0 % (ref 0–1)
EOS ABS: 0.2 10*3/uL (ref 0.0–0.7)
EOS PCT: 2 % (ref 0–5)
HEMATOCRIT: 25.3 % — AB (ref 36.0–46.0)
Hemoglobin: 9.1 g/dL — ABNORMAL LOW (ref 12.0–15.0)
LYMPHS ABS: 0.5 10*3/uL — AB (ref 0.7–4.0)
Lymphocytes Relative: 4 % — ABNORMAL LOW (ref 12–46)
MCH: 29.5 pg (ref 26.0–34.0)
MCHC: 36 g/dL (ref 30.0–36.0)
MCV: 82.1 fL (ref 78.0–100.0)
MONO ABS: 1.2 10*3/uL — AB (ref 0.1–1.0)
Monocytes Relative: 10 % (ref 3–12)
NEUTROS ABS: 9.9 10*3/uL — AB (ref 1.7–7.7)
Neutrophils Relative %: 84 % — ABNORMAL HIGH (ref 43–77)
Platelets: 246 10*3/uL (ref 150–400)
RBC: 3.08 MIL/uL — ABNORMAL LOW (ref 3.87–5.11)
RDW: 14.6 % (ref 11.5–15.5)
WBC: 11.8 10*3/uL — ABNORMAL HIGH (ref 4.0–10.5)

## 2013-07-09 LAB — MAGNESIUM: Magnesium: 1.1 mg/dL — ABNORMAL LOW (ref 1.5–2.5)

## 2013-07-09 LAB — PHOSPHORUS: PHOSPHORUS: 3.5 mg/dL (ref 2.3–4.6)

## 2013-07-09 LAB — PRO B NATRIURETIC PEPTIDE: Pro B Natriuretic peptide (BNP): 17532 pg/mL — ABNORMAL HIGH (ref 0–125)

## 2013-07-09 MED ORDER — POTASSIUM CHLORIDE 10 MEQ/50ML IV SOLN
10.0000 meq | INTRAVENOUS | Status: AC
  Administered 2013-07-09 – 2013-07-10 (×4): 10 meq via INTRAVENOUS
  Filled 2013-07-09 (×3): qty 50

## 2013-07-09 MED ORDER — POTASSIUM CHLORIDE 10 MEQ/50ML IV SOLN
10.0000 meq | INTRAVENOUS | Status: AC
  Administered 2013-07-09 (×10): 10 meq via INTRAVENOUS
  Filled 2013-07-09: qty 50

## 2013-07-09 MED ORDER — DEXTROSE 50 % IV SOLN: 25.0000 mL | Freq: Once | INTRAVENOUS | Status: AC | PRN

## 2013-07-09 MED ORDER — FENTANYL BOLUS VIA INFUSION
25.0000 ug | INTRAVENOUS | Status: DC | PRN
  Filled 2013-07-09: qty 50

## 2013-07-09 MED ORDER — DEXTROSE 50 % IV SOLN
INTRAVENOUS | Status: AC
  Administered 2013-07-09: 25 mL
  Filled 2013-07-09: qty 50

## 2013-07-09 MED ORDER — POTASSIUM CHLORIDE 10 MEQ/50ML IV SOLN
INTRAVENOUS | Status: AC
  Administered 2013-07-09: 10 meq via INTRAVENOUS
  Filled 2013-07-09: qty 50

## 2013-07-09 MED ORDER — FUROSEMIDE 10 MG/ML IJ SOLN
40.0000 mg | Freq: Three times a day (TID) | INTRAMUSCULAR | Status: AC
  Administered 2013-07-09 (×2): 40 mg via INTRAVENOUS
  Filled 2013-07-09 (×2): qty 4

## 2013-07-09 NOTE — Progress Notes (Signed)
[facility] ICU Electrolyte Replacement Protocol  Patient Name: Taylor [name] DOB: [birth year] MRN: [id number]  Date of Service  07/09/2013   HPI/Events of Note    Recent Labs Lab 07/05/13 0848 07/06/13 0545 07/07/13 0415 07/08/13 0400 07/09/13 0430  NA 127* 124* 123* 130* 136*  K 4.1 4.4 3.4* 3.0* 2.5*  CL 96 95* 90* 89* 88*  CO2 19 15* 19 30 40*  GLUCOSE 82 109* 110* 117* 91  BUN 29* 33* 32* 27* 20  CREATININE 1.69* 1.99* 1.95* 1.54* 1.24*  CALCIUM 6.8* 7.0* 7.4* 7.5* 7.3*  MG  --  1.9  --   --  1.1*  PHOS  --   --   --   --  3.5    Estimated Creatinine Clearance: 38.3 ml/min (by C-G formula based on Cr of 1.24).  Intake/Output     01/07 0701 - 01/08 0700   I.V. (mL/kg) 1955 (32.6)   Other 20   IV Piggyback 500   Total Intake(mL/kg) 2475 (41.3)   Urine (mL/kg/hr) 5220 (3.6)   Drains 630 (0.4)   Total Output 5850   Net -3375        - I/O DETAILED x24h    Total I/O In: 925 [I.V.:825; IV Piggyback:100] Out: 2560 [Urine:2440; Drains:120] - I/O THIS SHIFT    ASSESSMENT   eICURN Interventions  K+2.5 ICU Electrolyte Replacement Protocol criteria met. K+ Replaced per protocol. MD notified    ASSESSMENT: MAJOR ELECTROLYTE    [name] 07/09/2013, 6:16 AM

## 2013-07-09 NOTE — Progress Notes (Signed)
Hypoglycemic Event  CBG: 69  Treatment: 1/2 amp D50 (12.5 g)  Symptoms: none  Follow-up CBG Result: 83   Tynika Luddy, Francetta Found  Remember to initiate Hypoglycemia Order Set & complete

## 2013-07-09 NOTE — Progress Notes (Signed)
CRITICAL VALUE ALERT  Critical value received:  Potassium 2.5.     CO2 40   Date of notification:  07/09/13  Time of notification:  0552  Critical value read back: yes   Nurse who received alert:  Vivia Ewing RN  MD notified (1st page):  Dr. Earnest Conroy. (left message with RN in black box)    Time of first page:  [phone]  MD notified (2nd page):  Time of second page:  Responding MD:    Time MD responded:

## 2013-07-09 NOTE — Progress Notes (Signed)
4 Days Post-Op  Subjective: Patient remains intubated, but appears very comfortable on vent No pain indicated Drain - serous output  Objective: Vital signs in last 24 hours: Temp:  [97.6 F (36.4 C)-99 F (37.2 C)] 98.4 F (36.9 C) (01/08 0745) Pulse Rate:  [30-100] 84 (01/08 0900) Resp:  [10-24] 16 (01/08 0900) BP: (90-162)/(42-85) 128/64 mmHg (01/08 0900) SpO2:  [95 %-100 %] 100 % (01/08 0900) FiO2 (%):  [40 %] 40 % (01/08 0800)    Intake/Output from previous day: 01/07 0701 - 01/08 0700 In: 2600 [I.V.:2030; IV Piggyback:550] Out: 3557 [Urine:5420; Drains:630] Intake/Output this shift: Total I/O In: 300 [I.V.:150; IV Piggyback:150] Out: 675 [Urine:525; Drains:150]  General appearance: alert, cooperative and no distress Resp: bilateral rhonchi GI: soft, non-distended; G-tube site clear Incision clean; minimal granulation and drainage  Lab Results:   Recent Labs  07/08/13 0400 07/09/13 0430  WBC 13.0* 11.8*  HGB 9.2* 9.1*  HCT 25.8* 25.3*  PLT 262 246   BMET  Recent Labs  07/08/13 0400 07/09/13 0430  NA 130* 136*  K 3.0* 2.5*  CL 89* 88*  CO2 30 40*  GLUCOSE 117* 91  BUN 27* 20  CREATININE 1.54* 1.24*  CALCIUM 7.5* 7.3*   PT/INR No results found for this basename: LABPROT, INR,  in the last 72 hours ABG  Recent Labs  07/06/13 1852 07/08/13 1037  PHART 7.272* 7.472*  HCO3 15.8* 33.9*    Studies/Results: Dg Chest Port 1 View  07/09/2013   CLINICAL DATA:  On ventilator, evaluate endotracheal tube position, history breast cancer, throat cancer  EXAM: PORTABLE CHEST - 1 VIEW  COMPARISON:  Portable exam 0622 hr compared to 07/06/2013  FINDINGS: Tip of endotracheal tube projects 4.6 cm above carina.  Right jugular central venous catheter tip projects over SVC.  Normal heart size and mediastinal contours.  Bilateral pleural effusions and atelectasis.  Cannot exclude basilar infiltrates.  Calcified granuloma in the left mid lung is unchanged.  No  pneumothorax.  Bones demineralized.  Surgical clips right axilla.  IMPRESSION: Persistent bibasilar effusions atelectasis, cannot exclude underlying basilar infiltrates.   Electronically Signed   By: Lavonia Dana M.D.   On: 07/09/2013 08:11    Anti-infectives: Anti-infectives   Start     Dose/Rate Route Frequency Ordered Stop   07/07/13 0815  fluconazole (DIFLUCAN) IVPB 200 mg     200 mg 100 mL/hr over 60 Minutes Intravenous Every 24 hours 07/06/13 1030     07/05/13 0815  fluconazole (DIFLUCAN) IVPB 400 mg  Status:  Discontinued     400 mg 100 mL/hr over 120 Minutes Intravenous Every 24 hours 07/05/13 0800 07/06/13 1030   07/05/13 0800  piperacillin-tazobactam (ZOSYN) IVPB 3.375 g     3.375 g 12.5 mL/hr over 240 Minutes Intravenous 3 times per day 07/05/13 0742     07/05/13 0230  piperacillin-tazobactam (ZOSYN) IVPB 3.375 g     3.375 g 100 mL/hr over 30 Minutes Intravenous  Once 07/05/13 0229 07/05/13 0344   07/05/13 0230  metroNIDAZOLE (FLAGYL) IVPB 500 mg     500 mg 100 mL/hr over 60 Minutes Intravenous  Once 07/05/13 0229 07/05/13 0444      Assessment/Plan: s/p Procedure(s): EXPLORATORY LAPAROTOMY, insertion of gastrostomy tube, repair jejunal ulcer perforation. (N/A) I have ordered an upper gi with water soluble contrast via her g-tube to evaluate jejunal repair Extubation per CCM and Anesthesia. If the patient is to be extubated today, the upper GI can be delayed until tomorrow, as she  probably should not leave unit for radiology right after being exubated.  Discussed with patient, family member, and nurse.  LOS: 5 days    Taylor Cordy K. 07/09/2013

## 2013-07-09 NOTE — Progress Notes (Addendum)
PULMONARY / CRITICAL CARE MEDICINE  Name: Taylor [name] MRN: [id number] DOB: 1946-05-08    ADMISSION DATE:  07/04/2013 CONSULTATION DATE:  07/05/12  REFERRING MD :  CCS PRIMARY SERVICE: CCS  CHIEF COMPLAINT:  Respiratory failure  BRIEF PATIENT DESCRIPTION: 68 yo admitted 1/3 with perforated jejunum requiring emergent laparotomy.  PCCM consulted for post-op medical / vent management.   LINES / TUBES: OETT 1/3 (prolonged and tough intubation in OR with arrest) >>>  L brach A-lline 1/4 >>> 1/4 R IJ TLC 1/4>>>  (positional, consider PICC when GFR better) Foley 1/3 >>>  CULTURES: 1/4  MRSA PCR >>> neg  ANTIBIOTICS: Zosyn 1/3 >>> Fluconazole 1/3 >>> Flagyl 1/4 >>> 1/5    SIGNIFICANT EVENTS / STUDIES:  1/4  CT abdomen >>> Perforated viscus, most likely the thickened distal duodenum. There is pneumoperitoneum and extraluminal enteric contents. Gallstones or gallbladder sludge.  SUBJECTIVE/OVERNIGHT/INTERVAL HX Pt ready to extubate, will ask anesthesia for standby  VITAL SIGNS: Temp:  [97.6 F (36.4 C)-99 F (37.2 C)] 98.4 F (36.9 C) (01/08 0745) Pulse Rate:  [30-100] 86 (01/08 1000) Resp:  [10-24] 17 (01/08 1000) BP: (90-162)/(42-81) 121/75 mmHg (01/08 1000) SpO2:  [95 %-100 %] 100 % (01/08 1000) FiO2 (%):  [40 %] 40 % (01/08 0800) HEMODYNAMICS: CVP:  [8 mmHg-10 mmHg] 9 mmHg VENTILATOR SETTINGS: Vent Mode:  [-] CPAP;PSV FiO2 (%):  [40 %] 40 % Set Rate:  [15 bmp-24 bmp] 15 bmp Vt Set:  [450 mL] 450 mL PEEP:  [5 cmH20] 5 cmH20 Pressure Support:  [5 cmH20] 5 cmH20 Plateau Pressure:  [13 cmH20-17 cmH20] 16 cmH20 INTAKE / OUTPUT: Intake/Output     01/07 0701 - 01/08 0700 01/08 0701 - 01/09 0700   I.V. (mL/kg) 2030 (33.8) 300 (5)   Other 20    IV Piggyback 550 200   Total Intake(mL/kg) 2600 (43.3) 500 (8.3)   Urine (mL/kg/hr) 5420 (3.8) 725 (3.3)   Drains 630 (0.4) 150 (0.7)   Total Output 6050 875   Net -3450 -375         PHYSICAL EXAMINATION: General:   Mechanically ventilated, synchronous Neuro:  RASS 0. CAM-ICU neg for delirium. Sitting on bed HEENT:  OETT Cardiovascular:  Regular, no murmurs Lungs:  Bilateral breath sounds Abdomen:  Soft, abd dressing c/d/i, J-tube, JP drain Musculoskeletal:  Moves all extremities Skin:  Warm/dry, no edema  LABS:  PULMONARY  Recent Labs Lab 07/05/13 1211 07/05/13 1543 07/06/13 0348 07/06/13 1852 07/08/13 1037  PHART 7.271* 7.242* 7.226* 7.272* 7.472*  PCO2ART 33.0* 38.5 36.6 34.3* 46.2*  PO2ART 114.0* 102.0* 120.0* 119.0* 86.0  HCO3 15.2* 16.5* 15.0* 15.8* 33.9*  TCO2 16 18 16 17  35  O2SAT 98.0 97.0 98.0 98.0 97.0    CBC  Recent Labs Lab 07/07/13 0415 07/08/13 0400 07/09/13 0430  HGB 9.0* 9.2* 9.1*  HCT 25.2* 25.8* 25.3*  WBC 12.8* 13.0* 11.8*  PLT 281 262 246    COAGULATION  Recent Labs Lab 07/05/13 0848  INR 1.49    CARDIAC    Recent Labs Lab 07/05/13 0930  TROPONINI <0.30    Recent Labs Lab 07/09/13 0430  PROBNP 17532.0*     CHEMISTRY  Recent Labs Lab 07/05/13 0848 07/06/13 0545 07/07/13 0415 07/08/13 0400 07/09/13 0430  NA 127* 124* 123* 130* 136*  K 4.1 4.4 3.4* 3.0* 2.5*  CL 96 95* 90* 89* 88*  CO2 19 15* 19 30 40*  GLUCOSE 82 109* 110* 117* 91  BUN 29* 33* 32* 27*  20  CREATININE 1.69* 1.99* 1.95* 1.54* 1.24*  CALCIUM 6.8* 7.0* 7.4* 7.5* 7.3*  MG  --  1.9  --   --  1.1*  PHOS  --   --   --   --  3.5   Estimated Creatinine Clearance: 38.3 ml/min (by C-G formula based on Cr of 1.24).   LIVER  Recent Labs Lab 07/04/13 2245 07/05/13 0848  AST 20  --   ALT 9  --   ALKPHOS 68  --   BILITOT 0.4  --   PROT 6.9  --   ALBUMIN 3.0*  --   INR  --  1.49     INFECTIOUS  Recent Labs Lab 07/05/13 1200 07/05/13 1612  LATICACIDVEN 1.9 1.8     ENDOCRINE CBG (last 3)   Recent Labs  07/08/13 2345 07/09/13 0358 07/09/13 0742  GLUCAP 101* 96 96    IMAGING x48h  Dg Chest Port 1 View  07/09/2013   CLINICAL DATA:  On  ventilator, evaluate endotracheal tube position, history breast cancer, throat cancer  EXAM: PORTABLE CHEST - 1 VIEW  COMPARISON:  Portable exam 0622 hr compared to 07/06/2013  FINDINGS: Tip of endotracheal tube projects 4.6 cm above carina.  Right jugular central venous catheter tip projects over SVC.  Normal heart size and mediastinal contours.  Bilateral pleural effusions and atelectasis.  Cannot exclude basilar infiltrates.  Calcified granuloma in the left mid lung is unchanged.  No pneumothorax.  Bones demineralized.  Surgical clips right axilla.  IMPRESSION: Persistent bibasilar effusions atelectasis, cannot exclude underlying basilar infiltrates.   Electronically Signed   By: Lavonia Dana M.D.   On: 07/09/2013 08:11       ASSESSMENT / PLAN: Principal Problem:   Perforated ulcer Active Problems:   TOBACCO USE   Difficult airway for intubation   AKI (acute kidney injury)   Protein-calorie malnutrition, moderate   Acute blood loss anemia   S/P exploratory laparotomy   PULMONARY A: Acute respiratory failure. Passed SBT  NOTE THIS IS NOT POSTOP RESP FAILURE, THIS WAS DUE TO Difficult airway Difficult airway during intubation 07/14/13 and per report aneshtesia prefers to be present for extubation Hx of laryngeal carcinoma s/p chemo / XRT Tobacco abuse.  P:   Continuous mechanical support VAP bundle Passed sbt Trend ABG/CXR Difficult airway cart; anesthesia will be present for extub today  CARDIOVASCULAR A:  Shock resolved Brief cardiac arrest during intubation.  P:  Goal MAP>60 or SBP>90 Check echo and bnp Empiric lasix   RENAL  Recent Labs Lab 07/05/13 0848 07/06/13 0545 07/07/13 0415 07/08/13 0400 07/09/13 0430  NA 127* 124* 123* 130* 136*  K 4.1 4.4 3.4* 3.0* 2.5*  CL 96 95* 90* 89* 88*  CO2 19 15* 19 30 40*  GLUCOSE 82 109* 110* 117* 91  BUN 29* 33* 32* 27* 20  CREATININE 1.69* 1.99* 1.95* 1.54* 1.24*  CALCIUM 6.8* 7.0* 7.4* 7.5* 7.3*  MG  --  1.9  --    --  1.1*  PHOS  --   --   --   --  3.5    Recent Labs Lab 07/05/13 1211 07/05/13 1543 07/06/13 0348 07/06/13 1852 07/08/13 1037  PHART 7.271* 7.242* 7.226* 7.272* 7.472*  PCO2ART 33.0* 38.5 36.6 34.3* 46.2*  PO2ART 114.0* 102.0* 120.0* 119.0* 86.0  HCO3 15.2* 16.5* 15.0* 15.8* 33.9*  TCO2 16 18 16 17  35  O2SAT 98.0 97.0 98.0 98.0 97.0     A:   AKI. Improved  Hyponatremia Non gap metabolic acidosis due to GI surgery - on bicarb gtt>>resolved , will d/c naHco3 drip  P:   Goal CVP 10-12 Trend BMP  GASTROINTESTINAL A:   Perforated jejunum, s/p laparotomy. Nutrition. GIPx.  P:   NPO;  Protonix Hold TF   HEMATOLOGIC A:   Acute blood loss anemia. VTE Px. P:  Trend CBC Heparin [place] PRBC for hgb < 7gm%  INFECTIOUS A:   Peritonitis. P:   Cx / abx as above  ENDOCRINE A:   Hypothyroidism. Hyper / hypoglycemia  P:   SSI Levothyroxine   NEUROLOGIC A:   ETOH abuse. Pain / Sedation. Off fent drips P:   Goal RASS 0 to -1 Rx Thiamine / Folate  GLOBAL Updated partner at bedside 1/8   I have personally obtained history, examined patient, evaluated and interpreted laboratory and imaging results, reviewed medical records, formulated assessment / plan and placed orders.  CRITICAL CARE:  The patient is critically ill with multiple organ systems failure and requires high complexity decision making for assessment and support, frequent evaluation and titration of therapies, application of advanced monitoring technologies and extensive interpretation of multiple databases. Critical Care Time devoted to patient care services described in this note is 35 minutes.    Mariel Sleet Beeper  [phone]  Cell  [phone]  If no response or cell goes to voicemail, call beeper [phone]  07/09/2013 10:39 AM

## 2013-07-09 NOTE — Progress Notes (Signed)
NUTRITION FOLLOW UP  Intervention:    Recommend Speech Path consult for swallow evaluation; add interventions accordingly  RD to follow for nutrition care plan  Nutrition Dx:   Inadequate oral intake related to inability to eat, s/p extubation as evidenced by NPO status, ongoing  Goal:   Pt to meet >/= 90% of their estimated nutrition needs, currently unmet  Monitor:   PO diet advancement & intake, weight, labs, I/O's  Assessment:   Patient with PMH of throat & breast cancer who presented with 24 hour history of abdominal pain; 1/4 CT abdomen showed perforated viscus, most likely the thickened distal duodenum.   Patient s/p procedure 1/4:  EXPLORATORY LAPAROTOMY  PRIMARY REPAIR OF PERFORATED JEJUNAL INTERVAL WITH OMENTAL PATCH  GASTROSTOMY TUBE PLACEMENT  Patient extubated 1/8.  NGT out with extubation.  Plan for upper GI via G-tube for jejunal repair evaluation.  Height: Ht Readings from Last 1 Encounters:  07/05/13 5' 4.17" (1.63 m)    Weight Status:   Wt Readings from Last 1 Encounters:  07/07/13 132 lb 4.4 oz (60 kg)    Re-estimated needs:  Kcal: 1700-1900 Protein: 85-95 gm Fluid: 1.7-1.9 L  Skin: surgical incisions (neck, sacrum, abdomen)  Diet Order: NPO   Intake/Output Summary (Last 24 hours) at 07/09/13 1144 Last data filed at 07/09/13 1045  Gross per 24 hour  Intake   2175 ml  Output   5980 ml  Net  -3805 ml    Labs:   Recent Labs Lab 07/06/13 0545 07/07/13 0415 07/08/13 0400 07/09/13 0430  NA 124* 123* 130* 136*  K 4.4 3.4* 3.0* 2.5*  CL 95* 90* 89* 88*  CO2 15* 19 30 40*  BUN 33* 32* 27* 20  CREATININE 1.99* 1.95* 1.54* 1.24*  CALCIUM 7.0* 7.4* 7.5* 7.3*  MG 1.9  --   --  1.1*  PHOS  --   --   --  3.5  GLUCOSE 109* 110* 117* 91    CBG (last 3)   Recent Labs  07/08/13 2345 07/09/13 0358 07/09/13 0742  GLUCAP 101* 96 96    Scheduled Meds: . antiseptic oral rinse  15 mL Mouth Rinse QID  . chlorhexidine  15 mL Mouth Rinse  BID  . fluconazole (DIFLUCAN) IV  200 mg Intravenous Q24H  . folic acid  1 mg Intravenous Daily  . furosemide  40 mg Intravenous Q8H  . heparin subcutaneous  5,000 Units Subcutaneous Q8H  . insulin aspart  0-15 Units Subcutaneous Q4H  . levothyroxine  25 mcg Intravenous Daily  . pantoprazole (PROTONIX) IV  40 mg Intravenous Q24H  . piperacillin-tazobactam (ZOSYN)  IV  3.375 g Intravenous Q8H  . potassium chloride  10 mEq Intravenous Q1H  . thiamine  100 mg Intravenous Daily    Continuous Infusions:   Arthur Holms, RD, LDN Pager #: [phone] After-Hours Pager #: [phone]

## 2013-07-09 NOTE — Procedures (Signed)
Extubation Procedure Note  Patient Details:   Name: Taylor [name] DOB: 09/07/1945 MRN: [id number]  Pt extubated to [facility] after successful SBT.  Anesthesia and CCM MD present for extubation due to pt being extremely difficult intubation.  Cuff Leak audible.  Pt has good cough and able to vocalize.  Will continue to monitor.    Evaluation  O2 sats: stable throughout Complications: No apparent complications Patient did tolerate procedure well. Bilateral Breath Sounds: Rhonchi Suctioning: Airway Yes  Armonie Staten Apple 07/09/2013, 6:20 PM

## 2013-07-10 ENCOUNTER — Inpatient Hospital Stay (HOSPITAL_COMMUNITY): Payer: Medicare Other

## 2013-07-10 DIAGNOSIS — R197 Diarrhea, unspecified: Secondary | ICD-10-CM

## 2013-07-10 DIAGNOSIS — D72829 Elevated white blood cell count, unspecified: Secondary | ICD-10-CM

## 2013-07-10 LAB — CBC WITH DIFFERENTIAL/PLATELET
BASOS ABS: 0 10*3/uL (ref 0.0–0.1)
Basophils Relative: 0 % (ref 0–1)
Eosinophils Absolute: 0.2 10*3/uL (ref 0.0–0.7)
Eosinophils Relative: 1 % (ref 0–5)
HCT: 31.1 % — ABNORMAL LOW (ref 36.0–46.0)
Hemoglobin: 10.9 g/dL — ABNORMAL LOW (ref 12.0–15.0)
LYMPHS ABS: 1.3 10*3/uL (ref 0.7–4.0)
Lymphocytes Relative: 7 % — ABNORMAL LOW (ref 12–46)
MCH: 29.5 pg (ref 26.0–34.0)
MCHC: 35 g/dL (ref 30.0–36.0)
MCV: 84.1 fL (ref 78.0–100.0)
MONOS PCT: 4 % (ref 3–12)
Monocytes Absolute: 0.7 10*3/uL (ref 0.1–1.0)
NEUTROS ABS: 15.8 10*3/uL — AB (ref 1.7–7.7)
Neutrophils Relative %: 88 % — ABNORMAL HIGH (ref 43–77)
Platelets: 347 10*3/uL (ref 150–400)
RBC: 3.7 MIL/uL — ABNORMAL LOW (ref 3.87–5.11)
RDW: 15.3 % (ref 11.5–15.5)
WBC: 18 10*3/uL — AB (ref 4.0–10.5)

## 2013-07-10 LAB — GLUCOSE, CAPILLARY
GLUCOSE-CAPILLARY: 151 mg/dL — AB (ref 70–99)
GLUCOSE-CAPILLARY: 69 mg/dL — AB (ref 70–99)
GLUCOSE-CAPILLARY: 71 mg/dL (ref 70–99)
GLUCOSE-CAPILLARY: 80 mg/dL (ref 70–99)
Glucose-Capillary: 75 mg/dL (ref 70–99)
Glucose-Capillary: 143 mg/dL — ABNORMAL HIGH (ref 70–99)
Glucose-Capillary: 68 mg/dL — ABNORMAL LOW (ref 70–99)
Glucose-Capillary: 153 mg/dL — ABNORMAL HIGH (ref 70–99)
Glucose-Capillary: 66 mg/dL — ABNORMAL LOW (ref 70–99)
Glucose-Capillary: 85 mg/dL (ref 70–99)

## 2013-07-10 LAB — BASIC METABOLIC PANEL
BUN: 16 mg/dL (ref 6–23)
CO2: 36 mEq/L — ABNORMAL HIGH (ref 19–32)
CREATININE: 1.01 mg/dL (ref 0.50–1.10)
Calcium: 7.8 mg/dL — ABNORMAL LOW (ref 8.4–10.5)
Chloride: 89 mEq/L — ABNORMAL LOW (ref 96–112)
GFR calc Af Amer: 65 mL/min — ABNORMAL LOW (ref >90)
GFR, EST NON AFRICAN AMERICAN: 56 mL/min — AB (ref >90)
GLUCOSE: 66 mg/dL — AB (ref 70–99)
Potassium: 3 mEq/L — ABNORMAL LOW (ref 3.7–5.3)
Sodium: 142 mEq/L (ref 137–147)

## 2013-07-10 LAB — MAGNESIUM: Magnesium: 0.8 mg/dL — CL (ref 1.5–2.5)

## 2013-07-10 LAB — CLOSTRIDIUM DIFFICILE BY PCR: Toxigenic C. Difficile by PCR: NEGATIVE

## 2013-07-10 LAB — PHOSPHORUS: Phosphorus: 3.8 mg/dL (ref 2.3–4.6)

## 2013-07-10 MED ORDER — DEXTROSE 50 % IV SOLN
25.0000 mL | Freq: Once | INTRAVENOUS | Status: AC | PRN
  Administered 2013-07-10: 25 mL via INTRAVENOUS

## 2013-07-10 MED ORDER — DEXTROSE 50 % IV SOLN
INTRAVENOUS | Status: AC
  Filled 2013-07-10: qty 50

## 2013-07-10 MED ORDER — MAGNESIUM SULFATE 40 MG/ML IJ SOLN
2.0000 g | Freq: Once | INTRAMUSCULAR | Status: AC
  Administered 2013-07-10: 2 g via INTRAVENOUS
  Filled 2013-07-10: qty 50

## 2013-07-10 MED ORDER — POTASSIUM CHLORIDE 10 MEQ/50ML IV SOLN
10.0000 meq | INTRAVENOUS | Status: AC
  Administered 2013-07-10 (×6): 10 meq via INTRAVENOUS
  Filled 2013-07-10 (×6): qty 50

## 2013-07-10 MED ORDER — FUROSEMIDE 10 MG/ML IJ SOLN: 40.0000 mg | Freq: Once | INTRAMUSCULAR | Status: AC

## 2013-07-10 MED ORDER — DILTIAZEM HCL 100 MG IV SOLR
5.0000 mg/h | INTRAVENOUS | Status: DC
  Administered 2013-07-10: 5 mg/h via INTRAVENOUS
  Administered 2013-07-11 (×2): 10 mg/h via INTRAVENOUS
  Filled 2013-07-10 (×3): qty 100

## 2013-07-10 MED ORDER — SODIUM CHLORIDE 0.9 % IV SOLN
INTRAVENOUS | Status: DC
  Administered 2013-07-10: 10:00:00 via INTRAVENOUS

## 2013-07-10 MED ORDER — FUROSEMIDE 10 MG/ML IJ SOLN
INTRAMUSCULAR | Status: AC
  Administered 2013-07-10: 40 mg
  Filled 2013-07-10: qty 4

## 2013-07-10 MED ORDER — KCL IN DEXTROSE-NACL 40-5-0.45 MEQ/L-%-% IV SOLN
INTRAVENOUS | Status: DC
  Administered 2013-07-10: 17:00:00 via INTRAVENOUS
  Filled 2013-07-10 (×2): qty 1000

## 2013-07-10 MED ORDER — IOHEXOL 300 MG/ML  SOLN
150.0000 mL | Freq: Once | INTRAMUSCULAR | Status: AC | PRN
  Administered 2013-07-10: 150 mL

## 2013-07-10 MED ORDER — FUROSEMIDE 10 MG/ML IJ SOLN
40.0000 mg | Freq: Once | INTRAMUSCULAR | Status: AC
  Administered 2013-07-10: 40 mg via INTRAVENOUS
  Filled 2013-07-10: qty 4

## 2013-07-10 MED ORDER — ALBUTEROL SULFATE (2.5 MG/3ML) 0.083% IN NEBU
2.5000 mg | INHALATION_SOLUTION | Freq: Four times a day (QID) | RESPIRATORY_TRACT | Status: DC
  Administered 2013-07-10 – 2013-07-11 (×4): 2.5 mg via RESPIRATORY_TRACT
  Filled 2013-07-10 (×4): qty 3

## 2013-07-10 NOTE — Progress Notes (Signed)
[facility] Electrolyte Replacement Protocol  Patient Name: Taylor [name] DOB: [birth year] MRN: [id number]  Date of Service  07/10/2013   HPI/Events of Note    Recent Labs Lab 07/06/13 0545 07/07/13 0415 07/08/13 0400 07/09/13 0430 07/09/13 2000 07/10/13 0500  NA 124* 123* 130* 136* 141 142  K 4.4 3.4* 3.0* 2.5* 3.1* 3.0*  CL 95* 90* 89* 88* 90* 89*  CO2 15* 19 30 40* 37* 36*  GLUCOSE 109* 110* 117* 91 62* 66*  BUN 33* 32* 27* '20 18 16  ' CREATININE 1.99* 1.95* 1.54* 1.24* 1.12* 1.01  CALCIUM 7.0* 7.4* 7.5* 7.3* 7.6* 7.8*  MG 1.9  --   --  1.1*  --  0.8*  PHOS  --   --   --  3.5  --  3.8    Estimated Creatinine Clearance: 47 ml/min (by C-G formula based on Cr of 1.01).  Intake/Output     01/08 0701 - 01/09 0700   I.V. (mL/kg) 225 (3.8)   IV Piggyback 862.5   Total Intake(mL/kg) 1087.5 (18.1)   Urine (mL/kg/hr) 7600 (5.3)   Drains 380 (0.3)   Total Output 7980   Net -6892.5       Stool Occurrence 5 x    - I/O DETAILED x24h    Total I/O In: 250 [IV Piggyback:250] Out: 2300 [Urine:2300] - I/O THIS SHIFT    ASSESSMENT   eICURN Interventions  ICU Electrolyte Replacement Protocol criteria met.Labs Replaced by MD K+ 3.0 Mg 0.8   ASSESSMENT: MAJOR ELECTROLYTE    [name] 07/10/2013, 5:57 AM

## 2013-07-10 NOTE — Evaluation (Signed)
Physical Therapy Evaluation Patient Details Name: Taylor [name] MRN: [id number] DOB: 08-13-1945 Today's Date: 07/10/2013 Time: 4401-0272 PT Time Calculation (min): 32 min  PT Assessment / Plan / Recommendation History of Present Illness  68 yo admitted 1/3 with perforated jejunum requiring emergent exploratory laparotomy with gastrostomy tube placed.  Pt intubated 1/3- 07/09/13.  Pt with h/o ETOH abuse and tobacco use  Clinical Impression  Patient is s/p above surgery resulting in functional limitations due to the deficits listed below (see PT Problem List). Pt denies need for equipment PTA for Rt hip pain, however is currently taking much less pain medicine than she normally does and may benefit from use of a RW. Patient will benefit from skilled PT to increase their independence and safety with mobility to allow discharge to the venue listed below.       PT Assessment  Patient needs continued PT services    Follow Up Recommendations  Home health PT;Supervision for mobility/OOB    Does the patient have the potential to tolerate intense rehabilitation      Barriers to Discharge Inaccessible home environment 3 flights up to apartment    Equipment Recommendations       Recommendations for Other Services     Frequency Min 3X/week    Precautions / Restrictions Precautions Precautions: Fall;Other (comment) Precaution Comments: multiple lines including abd JP drain & gastrostomy   Pertinent Vitals/Pain BP stable (high end 140-150s/80-90s) SaO2 WNL on 2 L O2 HR 110s with frequent PVCs + dizziness upon sitting      Mobility  Bed Mobility Overal bed mobility: Needs Assistance Bed Mobility: Rolling;Sidelying to Sit Rolling: Min assist Sidelying to sit: Mod assist;HOB elevated General bed mobility comments: requires assist to lift trunk due to discomfort and weakness Transfers Overall transfer level: Needs assistance Equipment used: 2 person hand held assist Transfers: Sit  to/from [facility] Sit to Stand: Min assist;+2 physical assistance Stand pivot transfers: Min assist;+2 physical assistance;+2 safety/equipment General transfer comment: moves slowly; feels unsteady and need for UE support    Exercises General Exercises - Lower Extremity Ankle Circles/Pumps: AROM;Both;10 reps;Supine Quad Sets: AROM;Both;10 reps Gluteal Sets: AROM;Both;5 reps Heel Slides: AAROM;Both;5 reps;Supine   PT Diagnosis: Generalized weakness;Acute pain  PT Problem List: Decreased strength;Decreased activity tolerance;Decreased balance;Decreased mobility;Decreased knowledge of use of DME;Cardiopulmonary status limiting activity;Pain PT Treatment Interventions: DME instruction;Gait training;Stair training;Functional mobility training;Therapeutic activities;Therapeutic exercise;Patient/family education     PT Goals(Current goals can be found in the care plan section) Acute Rehab PT Goals Patient Stated Goal: To get better and go home PT Goal Formulation: With patient Time For Goal Achievement: 07/17/13 Potential to Achieve Goals: Good  Visit Information  Last PT Received On: 07/10/13 Assistance Needed: +1 PT/OT/SLP Co-Evaluation/Treatment: Yes Reason for Co-Treatment: For patient/therapist safety PT goals addressed during session: Mobility/safety with mobility;Strengthening/ROM OT goals addressed during session: Other (comment) (functional transfers) History of Present Illness: 68 yo admitted 1/3 with perforated jejunum requiring emergent exploratory laparotomy with gastrostomy tube placed.  Pt intubated 1/3- 07/09/13.  Pt with h/o ETOH abuse and tobacco use       Prior [name] expects to be discharged to:: Private residence Living Arrangements: Spouse/significant other Available Help at Discharge: Friend(s);Available 24 hours/day (Pt partner) Type of Home: Apartment Home Access: Stairs to enter Entrance Stairs-Number of  Steps: 3 flights 13-15 steps each flight Entrance Stairs-Rails: Left;Right Home Layout: One level Home Equipment: None Prior Function Level of Independence: Independent Comments: Pt reports h/o Rt. hip  arthritis Communication Communication: No difficulties Dominant Hand: Right    Cognition  Cognition Arousal/Alertness: Lethargic Behavior During Therapy: Flat affect Overall Cognitive Status: Within Functional Limits for tasks assessed    Extremity/Trunk Assessment Upper Extremity Assessment Upper Extremity Assessment: Defer to OT evaluation Lower Extremity Assessment Lower Extremity Assessment: Generalized weakness (crepitus and pain Rt hip) Cervical / Trunk Assessment Cervical / Trunk Assessment: Normal   Balance Balance Overall balance assessment: Needs assistance Sitting-balance support: Feet supported Sitting balance-Leahy Scale: Fair Standing balance support: Bilateral upper extremity supported Standing balance-Leahy Scale: Poor Standing balance comment: Pt mildly unsteady, requires HHA  General Comments General comments (skin integrity, edema, etc.): Pt with flat affect and slow processing, requiring incr time for all mobility. Pt reports pain in Rt hip currently worse than her abd pain  End of Session PT - End of Session Equipment Utilized During Treatment: Oxygen Activity Tolerance: Patient limited by fatigue Patient left: in chair;with call bell/phone within reach Nurse Communication: Mobility status  GP     Taylor [name] 07/10/2013, 4:48 PM Pager [phone]

## 2013-07-10 NOTE — Progress Notes (Signed)
PULMONARY / CRITICAL CARE MEDICINE  Name: Taylor [name] MRN: [id number] DOB: 06-03-1946    ADMISSION DATE:  07/04/2013 CONSULTATION DATE:  07/05/12  REFERRING MD :  CCS PRIMARY SERVICE: CCS  CHIEF COMPLAINT:  Respiratory failure  BRIEF PATIENT DESCRIPTION: 68 yo admitted 1/3 with perforated jejunum requiring emergent laparotomy.  PCCM consulted for post-op medical / vent management.   LINES / TUBES: OETT 1/3 (prolonged and tough intubation in OR with arrest) >>> 1/8 L brach A-lline 1/4 >>> 1/4 R IJ TLC 1/4>>>  (positional, consider PICC when GFR better) Foley 1/3 >>>  CULTURES: 1/4  MRSA PCR >>> neg  ANTIBIOTICS: Zosyn 1/3 >>> Fluconazole 1/3 >>> Flagyl 1/4 >>> 1/5    SIGNIFICANT EVENTS / STUDIES:  1/4  CT abdomen >>> Perforated viscus, most likely the thickened distal duodenum. There is pneumoperitoneum and extraluminal enteric contents. Gallstones or gallbladder sludge.  SUBJECTIVE/OVERNIGHT/INTERVAL HX extub ok 1/8, secretions, loose stools VITAL SIGNS: Temp:  [97.9 F (36.6 C)-98.5 F (36.9 C)] 97.9 F (36.6 C) (01/09 0756) Pulse Rate:  [26-108] 95 (01/09 0700) Resp:  [11-27] 23 (01/09 0700) BP: (98-164)/(56-86) 155/85 mmHg (01/09 0700) SpO2:  [91 %-100 %] 97 % (01/09 0700) HEMODYNAMICS: CVP:  [4 mmHg-7 mmHg] 4 mmHg VENTILATOR SETTINGS:   INTAKE / OUTPUT: Intake/Output     01/08 0701 - 01/09 0700 01/09 0701 - 01/10 0700   I.V. (mL/kg) 225 (3.8)    Other     IV Piggyback 1012.5    Total Intake(mL/kg) 1237.5 (20.6)    Urine (mL/kg/hr) 7900 (5.5)    Drains 380 (0.3)    Total Output 8280     Net -7042.5          Stool Occurrence 5 x     PHYSICAL EXAMINATION: General:  No distress Neuro: awake and alert HEENT: clear, s/p ca surg on neck Cardiovascular:  Regular, no murmurs Lungs:  Bilateral breath sounds, scattered rhonchi Abdomen:  Soft, abd dressing c/d/i, J-tube, JP drain Musculoskeletal:  Moves all extremities Skin:  Warm/dry, no  edema  LABS:  PULMONARY  Recent Labs Lab 07/05/13 1211 07/05/13 1543 07/06/13 0348 07/06/13 1852 07/08/13 1037  PHART 7.271* 7.242* 7.226* 7.272* 7.472*  PCO2ART 33.0* 38.5 36.6 34.3* 46.2*  PO2ART 114.0* 102.0* 120.0* 119.0* 86.0  HCO3 15.2* 16.5* 15.0* 15.8* 33.9*  TCO2 16 18 16 17  35  O2SAT 98.0 97.0 98.0 98.0 97.0    CBC  Recent Labs Lab 07/08/13 0400 07/09/13 0430 07/10/13 0500  HGB 9.2* 9.1* 10.9*  HCT 25.8* 25.3* 31.1*  WBC 13.0* 11.8* 18.0*  PLT 262 246 347    COAGULATION  Recent Labs Lab 07/05/13 0848  INR 1.49    CARDIAC    Recent Labs Lab 07/05/13 0930  TROPONINI <0.30    Recent Labs Lab 07/09/13 0430  PROBNP 17532.0*     CHEMISTRY  Recent Labs Lab 07/06/13 0545 07/07/13 0415 07/08/13 0400 07/09/13 0430 07/09/13 2000 07/10/13 0500  NA 124* 123* 130* 136* 141 142  K 4.4 3.4* 3.0* 2.5* 3.1* 3.0*  CL 95* 90* 89* 88* 90* 89*  CO2 15* 19 30 40* 37* 36*  GLUCOSE 109* 110* 117* 91 62* 66*  BUN 33* 32* 27* 20 18 16   CREATININE 1.99* 1.95* 1.54* 1.24* 1.12* 1.01  CALCIUM 7.0* 7.4* 7.5* 7.3* 7.6* 7.8*  MG 1.9  --   --  1.1*  --  0.8*  PHOS  --   --   --  3.5  --  3.8  Estimated Creatinine Clearance: 47 ml/min (by C-G formula based on Cr of 1.01).   LIVER  Recent Labs Lab 07/04/13 2245 07/05/13 0848  AST 20  --   ALT 9  --   ALKPHOS 68  --   BILITOT 0.4  --   PROT 6.9  --   ALBUMIN 3.0*  --   INR  --  1.49     INFECTIOUS  Recent Labs Lab 07/05/13 1200 07/05/13 1612  LATICACIDVEN 1.9 1.8     ENDOCRINE CBG (last 3)   Recent Labs  07/10/13 0448 07/10/13 0512 07/10/13 0758  GLUCAP 68* 143* 80    IMAGING x48h  Dg Chest Port 1 View  07/09/2013   CLINICAL DATA:  On ventilator, evaluate endotracheal tube position, history breast cancer, throat cancer  EXAM: PORTABLE CHEST - 1 VIEW  COMPARISON:  Portable exam 0622 hr compared to 07/06/2013  FINDINGS: Tip of endotracheal tube projects 4.6 cm above carina.   Right jugular central venous catheter tip projects over SVC.  Normal heart size and mediastinal contours.  Bilateral pleural effusions and atelectasis.  Cannot exclude basilar infiltrates.  Calcified granuloma in the left mid lung is unchanged.  No pneumothorax.  Bones demineralized.  Surgical clips right axilla.  IMPRESSION: Persistent bibasilar effusions atelectasis, cannot exclude underlying basilar infiltrates.   Electronically Signed   By: Lavonia Dana M.D.   On: 07/09/2013 08:11       ASSESSMENT / PLAN: Principal Problem:   Perforated ulcer Active Problems:   TOBACCO USE   Difficult airway for intubation   AKI (acute kidney injury)   Protein-calorie malnutrition, moderate   Acute blood loss anemia   S/P exploratory laparotomy   PULMONARY A: Acute respiratory failure. Passed SBT  NOTE THIS IS NOT POSTOP RESP FAILURE, THIS WAS DUE TO Difficult airway>>extubated and resolved 1/8 Difficult airway during intubation 07/14/13  Hx of laryngeal carcinoma s/p chemo / XRT Tobacco abuse.  P:   Flutter BDs Diurese F/u cxr  CARDIOVASCULAR A:  Shock resolved Brief cardiac arrest during intubation.  P:  More lasix   RENAL  Recent Labs Lab 07/06/13 0545 07/07/13 0415 07/08/13 0400 07/09/13 0430 07/09/13 2000 07/10/13 0500  NA 124* 123* 130* 136* 141 142  K 4.4 3.4* 3.0* 2.5* 3.1* 3.0*  CL 95* 90* 89* 88* 90* 89*  CO2 15* 19 30 40* 37* 36*  GLUCOSE 109* 110* 117* 91 62* 66*  BUN 33* 32* 27* 20 18 16   CREATININE 1.99* 1.95* 1.54* 1.24* 1.12* 1.01  CALCIUM 7.0* 7.4* 7.5* 7.3* 7.6* 7.8*  MG 1.9  --   --  1.1*  --  0.8*  PHOS  --   --   --  3.5  --  3.8    Recent Labs Lab 07/05/13 1211 07/05/13 1543 07/06/13 0348 07/06/13 1852 07/08/13 1037  PHART 7.271* 7.242* 7.226* 7.272* 7.472*  PCO2ART 33.0* 38.5 36.6 34.3* 46.2*  PO2ART 114.0* 102.0* 120.0* 119.0* 86.0  HCO3 15.2* 16.5* 15.0* 15.8* 33.9*  TCO2 16 18 16 17  35  O2SAT 98.0 97.0 98.0 98.0 97.0     A:    AKI. Improved  Hyponatremia hypokalemia  P:   Replete K IVF with KCL until eating Trend BMP  GASTROINTESTINAL A:   Perforated jejunum, s/p laparotomy. Nutrition. GIPx.  P:   NPO;  For gastric study today to chk leak Protonix Adv diet per CCS   HEMATOLOGIC A:   Acute blood loss anemia. VTE Px. P:  Trend CBC Heparin  [place]el PRBC for hgb < 7gm%  INFECTIOUS A:   Peritonitis. P:   Cx / abx as above  ENDOCRINE A:   Hypothyroidism. Hyper / hypoglycemia  P:   SSI Levothyroxine   NEUROLOGIC A:   ETOH abuse. Pain  P:   Prn fent Rx Thiamine / Folate  GLOBAL Keep in ICU for now d/t secretions issues.  I have personally obtained history, examined patient, evaluated and interpreted laboratory and imaging results, reviewed medical records, formulated assessment / plan and placed orders.  CRITICAL CARE:  The patient is critically ill with multiple organ systems failure and requires high complexity decision making for assessment and support, frequent evaluation and titration of therapies, application of advanced monitoring technologies and extensive interpretation of multiple databases. Critical Care Time devoted to patient care services described in this note is 35 minutes.    Mariel Sleet Beeper  [phone]  Cell  [phone]  If no response or cell goes to voicemail, call beeper [phone]  07/10/2013 9:25 AM

## 2013-07-10 NOTE — Progress Notes (Signed)
Hypoglycemic Event  CBG: 68  Treatment: 1/2 amp D50 (12.5 g)  Symptoms: none  Follow-up CBG: Time: 0512 CBG Result: 143  Possible Reasons for Event: unknown     Judi Cong, Francetta Found  Remember to initiate Hypoglycemia Order Set & complete

## 2013-07-10 NOTE — Progress Notes (Signed)
5 Days Post-Op  Subjective: Patient successfully extubated - sore throat Wants to have something to drink Minimal abdominal pain Five episodes of watery diarrhea in the last day  Objective: Vital signs in last 24 hours: Temp:  [97.9 F (36.6 C)-98.5 F (36.9 C)] 97.9 F (36.6 C) (01/09 0756) Pulse Rate:  [26-108] 95 (01/09 0700) Resp:  [11-27] 23 (01/09 0700) BP: (98-164)/(56-86) 155/85 mmHg (01/09 0700) SpO2:  [91 %-100 %] 97 % (01/09 0700)    Intake/Output from previous day: 01/08 0701 - 01/09 0700 In: 1237.5 [I.V.:225; IV Piggyback:1012.5] Out: 8280 [Urine:7900; Drains:380] Intake/Output this shift:    General appearance: alert, cooperative and no distress Resp: clear to auscultation bilaterally GI: soft, minimal tenderness, g-tube site clear Wound - minimal granulation tissue; clean  Lab Results:   Recent Labs  07/09/13 0430 07/10/13 0500  WBC 11.8* 18.0*  HGB 9.1* 10.9*  HCT 25.3* 31.1*  PLT 246 347   BMET  Recent Labs  07/09/13 2000 07/10/13 0500  NA 141 142  K 3.1* 3.0*  CL 90* 89*  CO2 37* 36*  GLUCOSE 62* 66*  BUN 18 16  CREATININE 1.12* 1.01  CALCIUM 7.6* 7.8*   PT/INR No results found for this basename: LABPROT, INR,  in the last 72 hours ABG  Recent Labs  07/08/13 1037  PHART 7.472*  HCO3 33.9*    Studies/Results: Dg Chest Port 1 View  07/09/2013   CLINICAL DATA:  On ventilator, evaluate endotracheal tube position, history breast cancer, throat cancer  EXAM: PORTABLE CHEST - 1 VIEW  COMPARISON:  Portable exam 0622 hr compared to 07/06/2013  FINDINGS: Tip of endotracheal tube projects 4.6 cm above carina.  Right jugular central venous catheter tip projects over SVC.  Normal heart size and mediastinal contours.  Bilateral pleural effusions and atelectasis.  Cannot exclude basilar infiltrates.  Calcified granuloma in the left mid lung is unchanged.  No pneumothorax.  Bones demineralized.  Surgical clips right axilla.  IMPRESSION:  Persistent bibasilar effusions atelectasis, cannot exclude underlying basilar infiltrates.   Electronically Signed   By: Lavonia Dana M.D.   On: 07/09/2013 08:11    Anti-infectives: Anti-infectives   Start     Dose/Rate Route Frequency Ordered Stop   07/07/13 0815  fluconazole (DIFLUCAN) IVPB 200 mg     200 mg 100 mL/hr over 60 Minutes Intravenous Every 24 hours 07/06/13 1030     07/05/13 0815  fluconazole (DIFLUCAN) IVPB 400 mg  Status:  Discontinued     400 mg 100 mL/hr over 120 Minutes Intravenous Every 24 hours 07/05/13 0800 07/06/13 1030   07/05/13 0800  piperacillin-tazobactam (ZOSYN) IVPB 3.375 g     3.375 g 12.5 mL/hr over 240 Minutes Intravenous 3 times per day 07/05/13 0742     07/05/13 0230  piperacillin-tazobactam (ZOSYN) IVPB 3.375 g     3.375 g 100 mL/hr over 30 Minutes Intravenous  Once 07/05/13 0229 07/05/13 0344   07/05/13 0230  metroNIDAZOLE (FLAGYL) IVPB 500 mg     500 mg 100 mL/hr over 60 Minutes Intravenous  Once 07/05/13 0229 07/05/13 0444      Assessment/Plan: s/p Procedure(s): EXPLORATORY LAPAROTOMY, insertion of gastrostomy tube, repair jejunal ulcer perforation. (N/A) Upper GI study today with water-soluble contrast to evaluate repair of jejunal perforation Rule out C. Diff Leukocytosis, but also had a rise in hemoglobin - ?hemoconcentration If UGI normal, then will start clear liquids and gastrostomy tube Continue abx - day 4  LOS: 6 days    Taylor [name]  K. 07/10/2013

## 2013-07-10 NOTE — Evaluation (Signed)
Occupational Therapy Evaluation Patient Details Name: Taylor [name] MRN: [id number] DOB: 08-11-1945 Today's Date: 07/10/2013 Time: 5956-3875 OT Time Calculation (min): 25 min  OT Assessment / Plan / Recommendation History of present illness 68 yo admitted 1/3 with perforated jejunum requiring emergent laparotomy.  Pt intubated 1/3- 07/09/13.  Pt with h/o ETOH abuse and tobacco use   Clinical Impression   Pt admitted with above.  She presents to OT with the below listed deficits and will benefit from continued OT to maximize safety and independence with BADLs.  Pt. Should progress well with BADLs, but she lives in 3rd floor apt and must ascend/descend steps to return home.      OT Assessment  Patient needs continued OT Services    Follow Up Recommendations  No OT follow up;Supervision - Intermittent    Barriers to Discharge      Equipment Recommendations  Other (comment) (TBD)    Recommendations for Other Services    Frequency  Min 2X/week    Precautions / Restrictions Precautions Precautions: Fall;Other (comment) Precaution Comments: multiple lines including    Pertinent Vitals/Pain     ADL  Eating/Feeding: NPO Grooming: Wash/dry hands;Wash/dry face;Teeth care;Brushing hair;Minimal assistance Where Assessed - Grooming: Supported sitting Upper Body Bathing: Moderate assistance Where Assessed - Upper Body Bathing: Supported sitting Lower Body Bathing: +1 Total assistance Where Assessed - Lower Body Bathing: Supported sit to stand Upper Body Dressing: Maximal assistance Where Assessed - Upper Body Dressing: Unsupported sitting;Supported sitting Lower Body Dressing: +1 Total assistance Where Assessed - Lower Body Dressing: Supported sit to Lobbyist: +2 Total assistance;Minimal assistance Toilet Transfer Method: Sit to stand;Stand pivot Toileting - Clothing Manipulation and Hygiene: +1 Total assistance Where Assessed - Toileting Clothing Manipulation and  Hygiene: Standing Transfers/Ambulation Related to ADLs: Pt pivoted to chair with min A and +1 for safety due to multiple lines     OT Diagnosis: Generalized weakness;Acute pain  OT Problem List: Decreased strength;Decreased activity tolerance;Impaired balance (sitting and/or standing);Decreased knowledge of use of DME or AE;Pain OT Treatment Interventions: Self-care/ADL training;DME and/or AE instruction;Therapeutic activities;Patient/family education   OT Goals(Current goals can be found in the care plan section) Acute Rehab OT Goals Patient Stated Goal: To get better and go home OT Goal Formulation: With patient Time For Goal Achievement: 07/24/13 Potential to Achieve Goals: Good ADL Goals Pt Will Perform Grooming: with supervision;standing Pt Will Perform Upper Body Bathing: with set-up;sitting Pt Will Perform Lower Body Bathing: with supervision;sit to/from stand Pt Will Perform Upper Body Dressing: with set-up;sitting Pt Will Perform Lower Body Dressing: with supervision;sit to/from stand Pt Will Transfer to Toilet: with supervision;ambulating;regular height toilet;grab bars Pt Will Perform Toileting - Clothing Manipulation and hygiene: with supervision;sit to/from stand Pt Will Perform Tub/Shower Transfer: with min guard assist;ambulating;rolling walker  Visit Information  Last OT Received On: 07/10/13 Assistance Needed: +1 PT/OT/SLP Co-Evaluation/Treatment: Yes Reason for Co-Treatment: For patient/therapist safety OT goals addressed during session: Other (comment) (functional transfers) History of Present Illness: 68 yo admitted 1/3 with perforated jejunum requiring emergent laparotomy.  Pt intubated 1/3- 07/09/13.  Pt with h/o ETOH abuse and tobacco use       Prior [place] expects to be discharged to:: Private residence Living Arrangements: Spouse/significant other Available Help at Discharge: Friend(s);Available 24 hours/day (Pt  partner) Type of Home: Apartment Home Access: Stairs to enter Entrance Stairs-Number of Steps: 3 flights 13-15 steps each flight Entrance Stairs-Rails: Left;Right Home Layout: One level Home Equipment: None  Prior Function Level of Independence: Independent Comments: Pt reports h/o Rt. hip arthritis Communication Communication: No difficulties Dominant Hand: Right         Vision/Perception     Cognition  Cognition Arousal/Alertness: Lethargic Behavior During Therapy: Flat affect Overall Cognitive Status: Within Functional Limits for tasks assessed    Extremity/Trunk Assessment Upper Extremity Assessment Upper Extremity Assessment: Generalized weakness Lower Extremity Assessment Lower Extremity Assessment: Defer to PT evaluation Cervical / Trunk Assessment Cervical / Trunk Assessment: Normal     Mobility Bed Mobility Overal bed mobility: Needs Assistance Bed Mobility: Rolling;Sidelying to Sit Rolling: Min assist Sidelying to sit: Mod assist General bed mobility comments: requires assist to lift trunk due to discomfort and weakness Transfers Overall transfer level: Needs assistance Equipment used: 2 person hand held assist Transfers: Sit to/from [facility] Sit to Stand: Min assist;+2 physical assistance Stand pivot transfers: +2 physical assistance;Min assist General transfer comment: Pt slow to move and slow.  Mildly unsteady      Exercise     Balance Balance Overall balance assessment: Needs assistance Sitting-balance support: Feet supported;Single extremity supported Sitting balance-Leahy Scale: Fair Standing balance support: Bilateral upper extremity supported Standing balance-Leahy Scale: Poor Standing balance comment: Pt mildly unsteady, requires HHA    End of Session OT - End of Session Activity Tolerance: Patient limited by fatigue Patient left: in chair;with call bell/phone within reach Nurse Communication: Mobility status  GO      Vennela Jutte M 07/10/2013, 3:46 PM

## 2013-07-10 NOTE — Progress Notes (Signed)
Hypoglycemic Event  CBG: 66  Treatment: D50 IV 25 mL  Symptoms: Nervous/irritable  Follow-up CBG: Time:1215 CBG Result:153  Possible Reasons for Event: Inadequate meal intake and Other: NPO r/t GI surgery  Comments/MD notified: Protocol followed.    [name]  Remember to initiate Hypoglycemia Order Set & complete

## 2013-07-10 NOTE — Progress Notes (Signed)
[facility] Progress Note Patient Name: [name] DOB: Dec 21, 1945 MRN: [id number]  Date of Service  07/10/2013   HPI/Events of Note  Call from nurse reporting increased oxygen requirements with rhonchus breath sounds.  Suctioning self frequently.  Sats have ranged from high 80s to 90s.  PN states plan is for ongoing diuresis.  eICU Interventions  Plan: 40 mg lasix IV now. Continue with pulm toilet Monitor   Intervention Category Intermediate Interventions: Respiratory distress - evaluation and management  Weslie Rasmus 07/10/2013, 10:49 PM

## 2013-07-11 ENCOUNTER — Inpatient Hospital Stay (HOSPITAL_COMMUNITY): Payer: Medicare Other

## 2013-07-11 DIAGNOSIS — F17200 Nicotine dependence, unspecified, uncomplicated: Secondary | ICD-10-CM

## 2013-07-11 LAB — GLUCOSE, CAPILLARY
GLUCOSE-CAPILLARY: 123 mg/dL — AB (ref 70–99)
GLUCOSE-CAPILLARY: 95 mg/dL (ref 70–99)
GLUCOSE-CAPILLARY: 128 mg/dL — AB (ref 70–99)
Glucose-Capillary: 123 mg/dL — ABNORMAL HIGH (ref 70–99)
Glucose-Capillary: 115 mg/dL — ABNORMAL HIGH (ref 70–99)
Glucose-Capillary: 113 mg/dL — ABNORMAL HIGH (ref 70–99)

## 2013-07-11 LAB — BASIC METABOLIC PANEL
BUN: 12 mg/dL (ref 6–23)
BUN: 10 mg/dL (ref 6–23)
CALCIUM: 6.9 mg/dL — AB (ref 8.4–10.5)
CHLORIDE: 89 meq/L — AB (ref 96–112)
CO2: 33 meq/L — AB (ref 19–32)
CO2: 27 meq/L (ref 19–32)
CREATININE: 0.89 mg/dL (ref 0.50–1.10)
Calcium: 7.5 mg/dL — ABNORMAL LOW (ref 8.4–10.5)
Chloride: 92 mEq/L — ABNORMAL LOW (ref 96–112)
Creatinine, Ser: 0.91 mg/dL (ref 0.50–1.10)
GFR calc Af Amer: 76 mL/min — ABNORMAL LOW (ref >90)
GFR calc non Af Amer: 66 mL/min — ABNORMAL LOW (ref >90)
GFR calc non Af Amer: 64 mL/min — ABNORMAL LOW (ref >90)
GFR, EST AFRICAN AMERICAN: 74 mL/min — AB (ref >90)
GLUCOSE: 114 mg/dL — AB (ref 70–99)
Glucose, Bld: 124 mg/dL — ABNORMAL HIGH (ref 70–99)
Potassium: 2.8 mEq/L — CL (ref 3.7–5.3)
Potassium: 3.9 mEq/L (ref 3.7–5.3)
SODIUM: 133 meq/L — AB (ref 137–147)
Sodium: 136 mEq/L — ABNORMAL LOW (ref 137–147)

## 2013-07-11 LAB — CBC WITH DIFFERENTIAL/PLATELET
BASOS ABS: 0 10*3/uL (ref 0.0–0.1)
Basophils Relative: 0 % (ref 0–1)
Eosinophils Absolute: 0.2 10*3/uL (ref 0.0–0.7)
Eosinophils Relative: 1 % (ref 0–5)
HEMATOCRIT: 30 % — AB (ref 36.0–46.0)
Hemoglobin: 10.5 g/dL — ABNORMAL LOW (ref 12.0–15.0)
Lymphocytes Relative: 4 % — ABNORMAL LOW (ref 12–46)
Lymphs Abs: 0.7 10*3/uL (ref 0.7–4.0)
MCH: 28.9 pg (ref 26.0–34.0)
MCHC: 35 g/dL (ref 30.0–36.0)
MCV: 82.6 fL (ref 78.0–100.0)
Monocytes Absolute: 1.3 10*3/uL — ABNORMAL HIGH (ref 0.1–1.0)
Monocytes Relative: 7 % (ref 3–12)
Neutro Abs: 16.1 10*3/uL — ABNORMAL HIGH (ref 1.7–7.7)
Neutrophils Relative %: 88 % — ABNORMAL HIGH (ref 43–77)
Platelets: 433 10*3/uL — ABNORMAL HIGH (ref 150–400)
RBC: 3.63 MIL/uL — ABNORMAL LOW (ref 3.87–5.11)
RDW: 15.9 % — AB (ref 11.5–15.5)
WBC: 18.3 10*3/uL — ABNORMAL HIGH (ref 4.0–10.5)

## 2013-07-11 MED ORDER — DIPHENHYDRAMINE HCL 50 MG/ML IJ SOLN: 12.5000 mg | INTRAMUSCULAR | Status: DC | PRN

## 2013-07-11 MED ORDER — KETOROLAC TROMETHAMINE 15 MG/ML IJ SOLN
15.0000 mg | Freq: Four times a day (QID) | INTRAMUSCULAR | Status: AC
  Administered 2013-07-11 – 2013-07-16 (×20): 15 mg via INTRAVENOUS
  Filled 2013-07-11 (×23): qty 1

## 2013-07-11 MED ORDER — HYDROCODONE-ACETAMINOPHEN 7.5-325 MG/15ML PO SOLN
10.0000 mL | ORAL | Status: DC | PRN
  Administered 2013-07-11 – 2013-07-27 (×34): 10 mL via ORAL
  Filled 2013-07-11 (×35): qty 15

## 2013-07-11 MED ORDER — ACETAMINOPHEN 325 MG PO TABS: 650.0000 mg | ORAL_TABLET | Freq: Four times a day (QID) | ORAL | Status: DC | PRN

## 2013-07-11 MED ORDER — POTASSIUM CHLORIDE 10 MEQ/50ML IV SOLN
10.0000 meq | INTRAVENOUS | Status: AC
  Administered 2013-07-11 (×8): 10 meq via INTRAVENOUS
  Filled 2013-07-11 (×8): qty 50

## 2013-07-11 MED ORDER — DILTIAZEM 12 MG/ML ORAL SUSPENSION
60.0000 mg | Freq: Four times a day (QID) | ORAL | Status: DC
  Administered 2013-07-11 – 2013-07-23 (×45): 60 mg
  Filled 2013-07-11 (×54): qty 6

## 2013-07-11 MED ORDER — ALBUTEROL SULFATE (2.5 MG/3ML) 0.083% IN NEBU
2.5000 mg | INHALATION_SOLUTION | Freq: Four times a day (QID) | RESPIRATORY_TRACT | Status: DC
  Administered 2013-07-11 – 2013-07-13 (×8): 2.5 mg via RESPIRATORY_TRACT
  Filled 2013-07-11 (×8): qty 3

## 2013-07-11 MED ORDER — KCL IN DEXTROSE-NACL 40-5-0.9 MEQ/L-%-% IV SOLN
INTRAVENOUS | Status: DC
  Administered 2013-07-11: 12:00:00 via INTRAVENOUS
  Filled 2013-07-11 (×2): qty 1000

## 2013-07-11 NOTE — Progress Notes (Signed)
6 Days Post-Op  Subjective: Patient awake, alert Had some mild desats last night - improving with Lasix Having some difficulty with coughing - not using Fentanyl because of worries about sedation/ airway Tolerating clears  Objective: Vital signs in last 24 hours: Temp:  [97.7 F (36.5 C)-99.5 F (37.5 C)] 98 F (36.7 C) (01/10 0812) Pulse Rate:  [32-109] 84 (01/10 0800) Resp:  [16-31] 25 (01/10 0800) BP: (99-151)/(57-96) 123/62 mmHg (01/10 0600) SpO2:  [88 %-98 %] 97 % (01/10 0819) Weight:  [96 lb 9 oz (43.8 kg)] 96 lb 9 oz (43.8 kg) (01/10 0700) Last BM Date: 07/10/13  Intake/Output from previous day: 01/09 0701 - 01/10 0700 In: 2648.1 [P.O.:780; I.V.:1223.1; IV Piggyback:525] Out: 2703 [Urine:3740; Drains:610] Intake/Output this shift: Total I/O In: 272.5 [I.V.:110; IV Piggyback:162.5] Out: 575 [Urine:575]  General appearance: alert, cooperative and no distress Resp: rhonchi bilaterally Cardio: regular rate and rhythm, S1, S2 normal, no murmur, click, rub or gallop GI: soft, g-tube site c/d/i Drain serous drainage only Midline wound c/d/i  Lab Results:   Recent Labs  07/10/13 0500 07/11/13 0444  WBC 18.0* 18.3*  HGB 10.9* 10.5*  HCT 31.1* 30.0*  PLT 347 433*   BMET  Recent Labs  07/10/13 0500 07/11/13 0444  NA 142 136*  K 3.0* 2.8*  CL 89* 89*  CO2 36* 33*  GLUCOSE 66* 114*  BUN 16 12  CREATININE 1.01 0.89  CALCIUM 7.8* 7.5*   PT/INR No results found for this basename: LABPROT, INR,  in the last 72 hours ABG  Recent Labs  07/08/13 1037  PHART 7.472*  HCO3 33.9*    Studies/Results: Dg Chest Port 1 View  07/11/2013   CLINICAL DATA:  Interval extubation, pleural effusions, atelectasis  EXAM: PORTABLE CHEST - 1 VIEW  COMPARISON:  07/09/2013  FINDINGS: Patient has been extubated. Pleural effusions are smaller with resolving basilar atelectasis as well. Left midlung calcified granuloma again evident. Normal heart size and vascularity. Chronic  apical scarring. Negative for pneumothorax. Postop changes in the right axilla. Right IJ central line tip at [facility] as before.  IMPRESSION: Extubated.  Smaller pleural effusions with improving basilar atelectasis   Electronically Signed   By: Daryll Brod M.D.   On: 07/11/2013 08:26   Dg Duanne Limerick W/water Sol Cm  07/10/2013   CLINICAL DATA:  Status post repair of proximal jejunal perforation 07/05/2013. Evaluate for leak.  EXAM: WATER SOLUBLE UPPER GI SERIES  TECHNIQUE: Single-column upper GI series was performed using water soluble contrast.  CONTRAST:  150 ml Omnipaque 300 per gastrostomy tube.  COMPARISON:  Abdominal pelvic CT 07/05/2013.  FLUOROSCOPY TIME:  38 seconds of pulsed fluoro. Images were acquired with fluoro store mechanism to minimize radiation.  FINDINGS: The scout abdominal radiograph demonstrates no free intraperitoneal air. Percutaneous G-tube and mid abdominal surgical drain are noted. Scattered vascular calcifications, calcified uterine fibroids and degenerative changes of both hips, worse on the right are noted.  Contrast was instilled via the G-tube by hand injection under intermittent fluoroscopy. There is normal filling of the stomach and emptying into the small bowel. The duodenum and jejunum appear normal without significant wall thickening. There is no evidence of perforation. There is no contrast opacification of the surgical drain. No significant gastroesophageal reflux was observed.  IMPRESSION: No significant findings following repair of jejunal perforation. No evidence of bowel leak.   Electronically Signed   By: Camie Patience M.D.   On: 07/10/2013 13:13    Anti-infectives: Anti-infectives   Start  Dose/Rate Route Frequency Ordered Stop   07/07/13 0815  fluconazole (DIFLUCAN) IVPB 200 mg     200 mg 100 mL/hr over 60 Minutes Intravenous Every 24 hours 07/06/13 1030     07/05/13 0815  fluconazole (DIFLUCAN) IVPB 400 mg  Status:  Discontinued     400 mg 100 mL/hr over 120 Minutes  Intravenous Every 24 hours 07/05/13 0800 07/06/13 1030   07/05/13 0800  piperacillin-tazobactam (ZOSYN) IVPB 3.375 g     3.375 g 12.5 mL/hr over 240 Minutes Intravenous 3 times per day 07/05/13 0742     07/05/13 0230  piperacillin-tazobactam (ZOSYN) IVPB 3.375 g     3.375 g 100 mL/hr over 30 Minutes Intravenous  Once 07/05/13 0229 07/05/13 0344   07/05/13 0230  metroNIDAZOLE (FLAGYL) IVPB 500 mg     500 mg 100 mL/hr over 60 Minutes Intravenous  Once 07/05/13 0229 07/05/13 0444      Assessment/Plan: s/p Procedure(s): EXPLORATORY LAPAROTOMY, insertion of gastrostomy tube, repair jejunal ulcer perforation. (N/A) Clear liquids, clamp NG tube Try Toradol 15 mg q6, PRN tylenol, PRn lortab elixir (with PRN benadryl) to manage pain Respiratory - flutter valve Patient at high risk for intra-abdominal abscess secondary to wide-spread contamination in abdomen - if WBC persists, consider CT scan.   LOS: 7 days    Ron Junco K. 07/11/2013

## 2013-07-11 NOTE — Progress Notes (Addendum)
PULMONARY / CRITICAL CARE MEDICINE  Name: Taylor [name] MRN: [id number] DOB: Apr 09, 1946    ADMISSION DATE:  07/04/2013 CONSULTATION DATE:  07/05/12  REFERRING MD :  [facility]S PRIMARY SERVICE: [facility]  CHIEF COMPLAINT:  Respiratory failure  BRIEF PATIENT DESCRIPTION: 68 yo admitted 1/3 with perforated jejunum requiring emergent laparotomy.  PCCM consulted for post-op medical / vent management.   LINES / TUBES: OETT 1/3 (prolonged and tough intubation in OR with arrest) >>> 1/8 L brach A-lline 1/4 >>> 1/4 R IJ TLC 1/4>>>  (positional, consider PICC when GFR better) Foley 1/3 >>>  CULTURES: 1/4  MRSA PCR >>> neg  ANTIBIOTICS: Zosyn 1/3 >>> Fluconazole 1/3 >>> Flagyl 1/4 >>> 1/5  SIGNIFICANT EVENTS / STUDIES:  1/4  CT abdomen >>> Perforated viscus, most likely the thickened distal duodenum. There is pneumoperitoneum and extraluminal enteric contents. Gallstones or gallbladder sludge.  SUBJECTIVE: Denies chest pain.  Breathing okay.  VITAL SIGNS: Temp:  [97.7 F (36.5 C)-99.5 F (37.5 C)] 98 F (36.7 C) (01/10 0812) Pulse Rate:  [32-109] 96 (01/10 0900) Resp:  [16-31] 22 (01/10 0900) BP: (99-151)/(57-96) 124/59 mmHg (01/10 0900) SpO2:  [88 %-98 %] 93 % (01/10 0900) Weight:  [96 lb 9 oz (43.8 kg)] 96 lb 9 oz (43.8 kg) (01/10 0700) HEMODYNAMICS: CVP:  [5 mmHg] 5 mmHg INTAKE / OUTPUT: Intake/Output     01/09 0701 - 01/10 0700 01/10 0701 - 01/11 0700   P.O. 780    I.V. (mL/kg) 1223.1 (27.9) 110 (2.5)   Other 120    IV Piggyback 525 162.5   Total Intake(mL/kg) 2648.1 (60.5) 272.5 (6.2)   Urine (mL/kg/hr) 3740 (3.6) 575 (5)   Drains 610 (0.6)    Total Output 4350 575   Net -1701.9 -302.5        Stool Occurrence 5 x     PHYSICAL EXAMINATION: General:  No distress Neuro: awake and alert HEENT: clear, s/p ca surg on neck Cardiovascular:  Regular, no murmurs Lungs:  Bilateral breath sounds, scattered rhonchi Abdomen:  Soft, abd dressing c/d/i, J-tube, JP  drain Musculoskeletal:  Moves all extremities Skin:  Warm/dry, no edema  LABS:  PULMONARY  Recent Labs Lab 07/05/13 1211 07/05/13 1543 07/06/13 0348 07/06/13 1852 07/08/13 1037  PHART 7.271* 7.242* 7.226* 7.272* 7.472*  PCO2ART 33.0* 38.5 36.6 34.3* 46.2*  PO2ART 114.0* 102.0* 120.0* 119.0* 86.0  HCO3 15.2* 16.5* 15.0* 15.8* 33.9*  TCO2 16 18 16 17  35  O2SAT 98.0 97.0 98.0 98.0 97.0    CBC  Recent Labs Lab 07/09/13 0430 07/10/13 0500 07/11/13 0444  HGB 9.1* 10.9* 10.5*  HCT 25.3* 31.1* 30.0*  WBC 11.8* 18.0* 18.3*  PLT 246 347 433*    COAGULATION  Recent Labs Lab 07/05/13 0848  INR 1.49    CARDIAC    Recent Labs Lab 07/05/13 0930  TROPONINI <0.30    Recent Labs Lab 07/09/13 0430  PROBNP 17532.0*     CHEMISTRY  Recent Labs Lab 07/06/13 0545  07/08/13 0400 07/09/13 0430 07/09/13 2000 07/10/13 0500 07/11/13 0444  NA 124*  < > 130* 136* 141 142 136*  K 4.4  < > 3.0* 2.5* 3.1* 3.0* 2.8*  CL 95*  < > 89* 88* 90* 89* 89*  CO2 15*  < > 30 40* 37* 36* 33*  GLUCOSE 109*  < > 117* 91 62* 66* 114*  BUN 33*  < > 27* 20 18 16 12   CREATININE 1.99*  < > 1.54* 1.24* 1.12* 1.01 0.89  CALCIUM  7.0*  < > 7.5* 7.3* 7.6* 7.8* 7.5*  MG 1.9  --   --  1.1*  --  0.8*  --   PHOS  --   --   --  3.5  --  3.8  --   < > = values in this interval not displayed. Estimated Creatinine Clearance: 42.4 ml/min (by C-G formula based on Cr of 0.89).   LIVER  Recent Labs Lab 07/04/13 2245 07/05/13 0848  AST 20  --   ALT 9  --   ALKPHOS 68  --   BILITOT 0.4  --   PROT 6.9  --   ALBUMIN 3.0*  --   INR  --  1.49     INFECTIOUS  Recent Labs Lab 07/05/13 1200 07/05/13 1612  LATICACIDVEN 1.9 1.8     ENDOCRINE CBG (last 3)   Recent Labs  07/11/13 0009 07/11/13 0421 07/11/13 0811  GLUCAP 123* 123* 95    IMAGING x48h  Dg Chest Port 1 View  07/11/2013   CLINICAL DATA:  Interval extubation, pleural effusions, atelectasis  EXAM: PORTABLE CHEST - 1  VIEW  COMPARISON:  07/09/2013  FINDINGS: Patient has been extubated. Pleural effusions are smaller with resolving basilar atelectasis as well. Left midlung calcified granuloma again evident. Normal heart size and vascularity. Chronic apical scarring. Negative for pneumothorax. Postop changes in the right axilla. Right IJ central line tip at [facility] as before.  IMPRESSION: Extubated.  Smaller pleural effusions with improving basilar atelectasis   Electronically Signed   By: Daryll Brod M.D.   On: 07/11/2013 08:26   Dg Duanne Limerick W/water Sol Cm  07/10/2013   CLINICAL DATA:  Status post repair of proximal jejunal perforation 07/05/2013. Evaluate for leak.  EXAM: WATER SOLUBLE UPPER GI SERIES  TECHNIQUE: Single-column upper GI series was performed using water soluble contrast.  CONTRAST:  150 ml Omnipaque 300 per gastrostomy tube.  COMPARISON:  Abdominal pelvic CT 07/05/2013.  FLUOROSCOPY TIME:  38 seconds of pulsed fluoro. Images were acquired with fluoro store mechanism to minimize radiation.  FINDINGS: The scout abdominal radiograph demonstrates no free intraperitoneal air. Percutaneous G-tube and mid abdominal surgical drain are noted. Scattered vascular calcifications, calcified uterine fibroids and degenerative changes of both hips, worse on the right are noted.  Contrast was instilled via the G-tube by hand injection under intermittent fluoroscopy. There is normal filling of the stomach and emptying into the small bowel. The duodenum and jejunum appear normal without significant wall thickening. There is no evidence of perforation. There is no contrast opacification of the surgical drain. No significant gastroesophageal reflux was observed.  IMPRESSION: No significant findings following repair of jejunal perforation. No evidence of bowel leak.   Electronically Signed   By: Camie Patience M.D.   On: 07/10/2013 13:13       ASSESSMENT / PLAN:  PULMONARY A: Acute respiratory failure >> difficult airway with hx of  laryngeal cancer s/p chemo and XRT. Tobacco abuse. P:   -oxygen to keep SpO2 > 92% -f/u CXR as needed -scheduled BD's -bronchial hygiene  CARDIOVASCULAR A:  Shock resolved. A fib with RVR. P:  -transition cardizem to per tube  RENAL A:   Acute kidney injury, hyponatremia >> resolved. Hypokalemia. P:   -monitor renal fx, urine outpt -f/u and replace electrolytes as needed  GASTROINTESTINAL A:   Perforated jejunum, s/p laparotomy. Severe protein calorie malnutrition. P:   -post-op care, nutrition per CCS -protonix for SUP  HEMATOLOGIC A:   Acute blood loss  anemia. P:  -f/u CBC -SQ heparin for DVT prevention  INFECTIOUS A:   Peritonitis >> improved. P:   -Day 8 of zosyn  ENDOCRINE A:   Hyperglycemia. Hx of hypothyroidism. P:   -SSI -continue levothyroxine   NEUROLOGIC A:   Hx of ETOH abuse. Post-op, cancer pain. P:   -pain control per primary team -continue Thiamine / Folate  Updated partner at bedside.  Chesley Mires, MD [facility] Pulmonary/Critical Care 07/11/2013, 9:45 AM Pager:  [phone] After 3pm call: [phone]

## 2013-07-11 NOTE — Progress Notes (Signed)
CRITICAL VALUE ALERT  Critical value received:  K-2.8  Date of notification:  07/11/13  Time of notification:  0610  Critical value read back:yes  Nurse who received alert:  Anselm Pancoast, RN  MD notified (1st page):  Dr. Jimmy Footman  Time of first page:  0615  MD notified (2nd page):  Time of second page:  Responding MD:  Dr. Jimmy Footman  Time MD responded:  [phone]

## 2013-07-11 NOTE — Progress Notes (Signed)
ANTIBIOTIC CONSULT NOTE - Follow-up  Pharmacy Consult for Zosyn Indication: perforated jejunum, s/p laparotomy   Allergies  Allergen Reactions  . Ace Inhibitors Cough  . Hydrocodone Itching  . Hydromorphone Itching  . Tylox [Oxycodone-Acetaminophen] Itching  . Sulfa Antibiotics Itching and Rash    Patient Measurements: Height: 5' 4.17" (163 cm) Weight: 96 lb 9 oz (43.8 kg) IBW/kg (Calculated) : 55.1  Vital Signs: Temp: 98.5 F (36.9 C) (01/10 1210) Temp src: Oral (01/10 1210) BP: 123/65 mmHg (01/10 1200) Pulse Rate: 101 (01/10 1200) Intake/Output from previous day: 01/09 0701 - 01/10 0700 In: 2648.1 [P.O.:780; I.V.:1223.1; IV Piggyback:525] Out: 8144 [Urine:3740; Drains:610] Intake/Output from this shift: Total I/O In: 478.3 [I.V.:165.8; IV Piggyback:312.5] Out: 751 [Urine:750; Stool:1]  Labs:  Recent Labs  07/09/13 0430 07/09/13 2000 07/10/13 0500 07/11/13 0444  WBC 11.8*  --  18.0* 18.3*  HGB 9.1*  --  10.9* 10.5*  PLT 246  --  347 433*  CREATININE 1.24* 1.12* 1.01 0.89   Estimated Creatinine Clearance: 42.4 ml/min (by C-G formula based on Cr of 0.89). No results found for this basename: VANCOTROUGH, VANCOPEAK, VANCORANDOM, GENTTROUGH, GENTPEAK, GENTRANDOM, TOBRATROUGH, TOBRAPEAK, TOBRARND, AMIKACINPEAK, AMIKACINTROU, AMIKACIN,  in the last 72 hours   Assessment: 68 yo F with history of throat cancer and breast cancer who presented with worsening abdominal pain. Found to have a perforated jejunum and now s/p laparotomy. She continues on zosyn D#8. Pt is afebrile and WBC is 18.3. Scr is down to 0.89. AKI seems resolved. She is also no fluconazole. C.diff negative.   1/4 Zosyn>> 1/4 Flucon>>  Goal of Therapy:  Resolution of possible infection  Plan:  1. Continue zosyn 3.375gm IV Q8H (4 hr inf) 2. F/u renal fxn, C&S, clinical status   Albertina Parr, PharmD.  Clinical Pharmacist Pager [phone]

## 2013-07-12 DIAGNOSIS — R634 Abnormal weight loss: Secondary | ICD-10-CM

## 2013-07-12 DIAGNOSIS — I10 Essential (primary) hypertension: Secondary | ICD-10-CM

## 2013-07-12 DIAGNOSIS — J9600 Acute respiratory failure, unspecified whether with hypoxia or hypercapnia: Secondary | ICD-10-CM

## 2013-07-12 DIAGNOSIS — D62 Acute posthemorrhagic anemia: Secondary | ICD-10-CM

## 2013-07-12 DIAGNOSIS — E039 Hypothyroidism, unspecified: Secondary | ICD-10-CM

## 2013-07-12 LAB — BASIC METABOLIC PANEL
BUN: 10 mg/dL (ref 6–23)
CHLORIDE: 94 meq/L — AB (ref 96–112)
CO2: 27 mEq/L (ref 19–32)
Calcium: 7 mg/dL — ABNORMAL LOW (ref 8.4–10.5)
Creatinine, Ser: 0.88 mg/dL (ref 0.50–1.10)
GFR calc non Af Amer: 66 mL/min — ABNORMAL LOW (ref >90)
GFR, EST AFRICAN AMERICAN: 77 mL/min — AB (ref >90)
Glucose, Bld: 90 mg/dL (ref 70–99)
Potassium: 3.9 mEq/L (ref 3.7–5.3)
SODIUM: 133 meq/L — AB (ref 137–147)

## 2013-07-12 LAB — CBC WITH DIFFERENTIAL/PLATELET
Basophils Absolute: 0 10*3/uL (ref 0.0–0.1)
Basophils Relative: 0 % (ref 0–1)
Eosinophils Absolute: 0.4 10*3/uL (ref 0.0–0.7)
Eosinophils Relative: 3 % (ref 0–5)
HEMATOCRIT: 27.1 % — AB (ref 36.0–46.0)
Hemoglobin: 9.5 g/dL — ABNORMAL LOW (ref 12.0–15.0)
LYMPHS PCT: 5 % — AB (ref 12–46)
Lymphs Abs: 0.7 10*3/uL (ref 0.7–4.0)
MCH: 29.4 pg (ref 26.0–34.0)
MCHC: 35.1 g/dL (ref 30.0–36.0)
MCV: 83.9 fL (ref 78.0–100.0)
MONOS PCT: 7 % (ref 3–12)
Monocytes Absolute: 1 10*3/uL (ref 0.1–1.0)
Neutro Abs: 12.5 10*3/uL — ABNORMAL HIGH (ref 1.7–7.7)
Neutrophils Relative %: 85 % — ABNORMAL HIGH (ref 43–77)
Platelets: 488 10*3/uL — ABNORMAL HIGH (ref 150–400)
RBC: 3.23 MIL/uL — ABNORMAL LOW (ref 3.87–5.11)
RDW: 16.2 % — ABNORMAL HIGH (ref 11.5–15.5)
WBC: 14.6 10*3/uL — AB (ref 4.0–10.5)

## 2013-07-12 LAB — GLUCOSE, CAPILLARY
GLUCOSE-CAPILLARY: 91 mg/dL (ref 70–99)
GLUCOSE-CAPILLARY: 94 mg/dL (ref 70–99)
Glucose-Capillary: 101 mg/dL — ABNORMAL HIGH (ref 70–99)
Glucose-Capillary: 122 mg/dL — ABNORMAL HIGH (ref 70–99)
Glucose-Capillary: 55 mg/dL — ABNORMAL LOW (ref 70–99)
Glucose-Capillary: 57 mg/dL — ABNORMAL LOW (ref 70–99)
Glucose-Capillary: 82 mg/dL (ref 70–99)
Glucose-Capillary: 92 mg/dL (ref 70–99)
Glucose-Capillary: 98 mg/dL (ref 70–99)

## 2013-07-12 MED ORDER — VITAMIN B-1 100 MG PO TABS
100.0000 mg | ORAL_TABLET | Freq: Every day | ORAL | Status: DC
  Administered 2013-07-13 – 2013-07-18 (×5): 100 mg via ORAL
  Filled 2013-07-12 (×7): qty 1

## 2013-07-12 MED ORDER — LEVOTHYROXINE SODIUM 50 MCG PO TABS
50.0000 ug | ORAL_TABLET | Freq: Every day | ORAL | Status: DC
  Administered 2013-07-13 – 2013-07-26 (×14): 50 ug via ORAL
  Filled 2013-07-12 (×18): qty 1

## 2013-07-12 MED ORDER — ENSURE COMPLETE PO LIQD
237.0000 mL | Freq: Three times a day (TID) | ORAL | Status: DC
  Administered 2013-07-12 – 2013-07-14 (×7): 237 mL via ORAL

## 2013-07-12 MED ORDER — DEXTROSE 50 % IV SOLN
INTRAVENOUS | Status: AC
  Administered 2013-07-12: 17:00:00 25 mL via INTRAVENOUS
  Filled 2013-07-12: qty 50

## 2013-07-12 MED ORDER — DEXTROSE 50 % IV SOLN
25.0000 mL | Freq: Once | INTRAVENOUS | Status: AC
  Administered 2013-07-12: 25 mL via INTRAVENOUS

## 2013-07-12 MED ORDER — FOLIC ACID 1 MG PO TABS
1.0000 mg | ORAL_TABLET | Freq: Every day | ORAL | Status: DC
  Administered 2013-07-13 – 2013-07-18 (×5): 1 mg via ORAL
  Filled 2013-07-12 (×7): qty 1

## 2013-07-12 MED ORDER — OXYCODONE-ACETAMINOPHEN 5-325 MG PO TABS
1.0000 | ORAL_TABLET | ORAL | Status: DC | PRN
  Administered 2013-07-12 – 2013-07-14 (×6): 2 via ORAL
  Administered 2013-07-14 – 2013-07-16 (×2): 1 via ORAL
  Administered 2013-07-18 – 2013-07-21 (×6): 2 via ORAL
  Administered 2013-07-21 (×2): 1 via ORAL
  Administered 2013-07-22 – 2013-07-24 (×6): 2 via ORAL
  Filled 2013-07-12: qty 1
  Filled 2013-07-12: qty 2
  Filled 2013-07-12: qty 1
  Filled 2013-07-12 (×6): qty 2
  Filled 2013-07-12: qty 1
  Filled 2013-07-12 (×2): qty 2
  Filled 2013-07-12: qty 1
  Filled 2013-07-12 (×9): qty 2

## 2013-07-12 NOTE — Progress Notes (Signed)
Patient ID: CRYSTA GULICK, female   DOB: 30-Jun-1946, 68 y.o.   MRN: [id number]   Subjective: Pt had a BM yesterday, tolerated clears.  No n/v.  No sob, cp.    Objective:  Vital signs:  Filed Vitals:   07/12/13 0700 07/12/13 0747 07/12/13 0756 07/12/13 0800  BP: 114/56   114/79  Pulse: 83   92  Temp:   97.8 F (36.6 C)   TempSrc:   Oral   Resp: 19   17  Height:      Weight:      SpO2: 96% 98%  99%    Last BM Date: 07/11/13  Intake/Output   Yesterday:  01/10 0701 - 01/11 0700 In: 4128 [I.V.:1161.5; IV Piggyback:612.5] Out: 2231 [Urine:2190; Drains:40; Stool:1] This shift:  Total I/O In: 200 [P.O.:50; I.V.:50; IV Piggyback:100] Out: 175 [Urine:175]   Physical Exam: General: Pt awake/alert/oriented x3 in acute distress Chest: CTA. No chest wall pain w good excursion CV:  Pulses intact.  Regular rhythm MS: Normal AROM mjr joints.  No obvious deformity Abdomen: Soft.  Nondistended.   Mildly tender.  Dressing is c/d/i.  No evidence of peritonitis.  Gtube.  Drain with serous output.  Ext:  SCDs BLE.  No mjr edema.  No cyanosis Skin: No petechiae / purpura   Problem List:   Principal Problem:   Perforated ulcer Active Problems:   TOBACCO USE   Difficult airway for intubation   AKI (acute kidney injury)   Protein-calorie malnutrition, moderate   Acute blood loss anemia   S/P exploratory laparotomy    Results:   Labs: Results for orders placed during the hospital encounter of 07/04/13 (from the past 48 hour(s))  GLUCOSE, CAPILLARY     Status: Abnormal   Collection Time    07/10/13 11:40 AM      Result Value Range   Glucose-Capillary 66 (*) 70 - 99 mg/dL  GLUCOSE, CAPILLARY     Status: Abnormal   Collection Time    07/10/13 12:05 PM      Result Value Range   Glucose-Capillary 153 (*) 70 - 99 mg/dL  GLUCOSE, CAPILLARY     Status: None   Collection Time    07/10/13  3:50 PM      Result Value Range   Glucose-Capillary 85  70 - 99 mg/dL  GLUCOSE,  CAPILLARY     Status: Abnormal   Collection Time    07/10/13  8:21 PM      Result Value Range   Glucose-Capillary 151 (*) 70 - 99 mg/dL   Comment 1 Documented in Chart     Comment 2 Notify RN    GLUCOSE, CAPILLARY     Status: Abnormal   Collection Time    07/11/13 12:09 AM      Result Value Range   Glucose-Capillary 123 (*) 70 - 99 mg/dL  GLUCOSE, CAPILLARY     Status: Abnormal   Collection Time    07/11/13  4:21 AM      Result Value Range   Glucose-Capillary 123 (*) 70 - 99 mg/dL   Comment 1 Documented in Chart     Comment 2 Notify RN    BASIC METABOLIC PANEL     Status: Abnormal   Collection Time    07/11/13  4:44 AM      Result Value Range   Sodium 136 (*) 137 - 147 mEq/L   Potassium 2.8 (*) 3.7 - 5.3 mEq/L   Comment: CRITICAL RESULT CALLED TO, READ  BACK BY AND VERIFIED WITH:     M HOPPER,RN 07/11/13 3790 RHOLMES   Chloride 89 (*) 96 - 112 mEq/L   CO2 33 (*) 19 - 32 mEq/L   Glucose, Bld 114 (*) 70 - 99 mg/dL   BUN 12  6 - 23 mg/dL   Creatinine, Ser 0.89  0.50 - 1.10 mg/dL   Calcium 7.5 (*) 8.4 - 10.5 mg/dL   GFR calc non Af Amer 66 (*) >90 mL/min   GFR calc Af Amer 76 (*) >90 mL/min   Comment: (NOTE)     The eGFR has been calculated using the CKD EPI equation.     This calculation has not been validated in all clinical situations.     eGFR's persistently <90 mL/min signify possible Chronic Kidney     Disease.  CBC WITH DIFFERENTIAL     Status: Abnormal   Collection Time    07/11/13  4:44 AM      Result Value Range   WBC 18.3 (*) 4.0 - 10.5 K/uL   RBC 3.63 (*) 3.87 - 5.11 MIL/uL   Hemoglobin 10.5 (*) 12.0 - 15.0 g/dL   HCT 30.0 (*) 36.0 - 46.0 %   MCV 82.6  78.0 - 100.0 fL   MCH 28.9  26.0 - 34.0 pg   MCHC 35.0  30.0 - 36.0 g/dL   RDW 15.9 (*) 11.5 - 15.5 %   Platelets 433 (*) 150 - 400 K/uL   Neutrophils Relative % 88 (*) 43 - 77 %   Lymphocytes Relative 4 (*) 12 - 46 %   Monocytes Relative 7  3 - 12 %   Eosinophils Relative 1  0 - 5 %   Basophils Relative 0   0 - 1 %   Neutro Abs 16.1 (*) 1.7 - 7.7 K/uL   Lymphs Abs 0.7  0.7 - 4.0 K/uL   Monocytes Absolute 1.3 (*) 0.1 - 1.0 K/uL   Eosinophils Absolute 0.2  0.0 - 0.7 K/uL   Basophils Absolute 0.0  0.0 - 0.1 K/uL   RBC Morphology TARGET CELLS     WBC Morphology TOXIC GRANULATION     Comment: MILD LEFT SHIFT (1-5% METAS, OCC MYELO, OCC BANDS)  GLUCOSE, CAPILLARY     Status: None   Collection Time    07/11/13  8:11 AM      Result Value Range   Glucose-Capillary 95  70 - 99 mg/dL  GLUCOSE, CAPILLARY     Status: Abnormal   Collection Time    07/11/13 12:09 PM      Result Value Range   Glucose-Capillary 115 (*) 70 - 99 mg/dL   Comment 1 Notify RN    GLUCOSE, CAPILLARY     Status: Abnormal   Collection Time    07/11/13  4:01 PM      Result Value Range   Glucose-Capillary 113 (*) 70 - 99 mg/dL   Comment 1 Notify RN    BASIC METABOLIC PANEL     Status: Abnormal   Collection Time    07/11/13  8:00 PM      Result Value Range   Sodium 133 (*) 137 - 147 mEq/L   Potassium 3.9  3.7 - 5.3 mEq/L   Comment: DELTA CHECK NOTED   Chloride 92 (*) 96 - 112 mEq/L   CO2 27  19 - 32 mEq/L   Glucose, Bld 124 (*) 70 - 99 mg/dL   BUN 10  6 - 23 mg/dL   Creatinine, Ser  0.91  0.50 - 1.10 mg/dL   Calcium 6.9 (*) 8.4 - 10.5 mg/dL   GFR calc non Af Amer 64 (*) >90 mL/min   GFR calc Af Amer 74 (*) >90 mL/min   Comment: (NOTE)     The eGFR has been calculated using the CKD EPI equation.     This calculation has not been validated in all clinical situations.     eGFR's persistently <90 mL/min signify possible Chronic Kidney     Disease.  GLUCOSE, CAPILLARY     Status: Abnormal   Collection Time    07/11/13  8:14 PM      Result Value Range   Glucose-Capillary 128 (*) 70 - 99 mg/dL   Comment 1 Documented in Chart     Comment 2 Notify RN    GLUCOSE, CAPILLARY     Status: None   Collection Time    07/11/13 11:55 PM      Result Value Range   Glucose-Capillary 94  70 - 99 mg/dL   Comment 1 Documented in  Chart     Comment 2 Notify RN    BASIC METABOLIC PANEL     Status: Abnormal   Collection Time    07/12/13  4:10 AM      Result Value Range   Sodium 133 (*) 137 - 147 mEq/L   Potassium 3.9  3.7 - 5.3 mEq/L   Chloride 94 (*) 96 - 112 mEq/L   CO2 27  19 - 32 mEq/L   Glucose, Bld 90  70 - 99 mg/dL   BUN 10  6 - 23 mg/dL   Creatinine, Ser 0.88  0.50 - 1.10 mg/dL   Calcium 7.0 (*) 8.4 - 10.5 mg/dL   GFR calc non Af Amer 66 (*) >90 mL/min   GFR calc Af Amer 77 (*) >90 mL/min   Comment: (NOTE)     The eGFR has been calculated using the CKD EPI equation.     This calculation has not been validated in all clinical situations.     eGFR's persistently <90 mL/min signify possible Chronic Kidney     Disease.  CBC WITH DIFFERENTIAL     Status: Abnormal   Collection Time    07/12/13  4:10 AM      Result Value Range   WBC 14.6 (*) 4.0 - 10.5 K/uL   RBC 3.23 (*) 3.87 - 5.11 MIL/uL   Hemoglobin 9.5 (*) 12.0 - 15.0 g/dL   HCT 27.1 (*) 36.0 - 46.0 %   MCV 83.9  78.0 - 100.0 fL   MCH 29.4  26.0 - 34.0 pg   MCHC 35.1  30.0 - 36.0 g/dL   RDW 16.2 (*) 11.5 - 15.5 %   Platelets 488 (*) 150 - 400 K/uL   Neutrophils Relative % 85 (*) 43 - 77 %   Lymphocytes Relative 5 (*) 12 - 46 %   Monocytes Relative 7  3 - 12 %   Eosinophils Relative 3  0 - 5 %   Basophils Relative 0  0 - 1 %   Neutro Abs 12.5 (*) 1.7 - 7.7 K/uL   Lymphs Abs 0.7  0.7 - 4.0 K/uL   Monocytes Absolute 1.0  0.1 - 1.0 K/uL   Eosinophils Absolute 0.4  0.0 - 0.7 K/uL   Basophils Absolute 0.0  0.0 - 0.1 K/uL   RBC Morphology TARGET CELLS     WBC Morphology TOXIC GRANULATION    GLUCOSE, CAPILLARY  Status: Abnormal   Collection Time    07/12/13  7:54 AM      Result Value Range   Glucose-Capillary 101 (*) 70 - 99 mg/dL   Comment 1 Notify RN      Imaging / Studies: Dg Chest Port 1 View  07/11/2013   CLINICAL DATA:  Interval extubation, pleural effusions, atelectasis  EXAM: PORTABLE CHEST - 1 VIEW  COMPARISON:  07/09/2013   FINDINGS: Patient has been extubated. Pleural effusions are smaller with resolving basilar atelectasis as well. Left midlung calcified granuloma again evident. Normal heart size and vascularity. Chronic apical scarring. Negative for pneumothorax. Postop changes in the right axilla. Right IJ central line tip at [facility] as before.  IMPRESSION: Extubated.  Smaller pleural effusions with improving basilar atelectasis   Electronically Signed   By: Daryll Brod M.D.   On: 07/11/2013 08:26   Dg Duanne Limerick W/water Sol Cm  07/10/2013   CLINICAL DATA:  Status post repair of proximal jejunal perforation 07/05/2013. Evaluate for leak.  EXAM: WATER SOLUBLE UPPER GI SERIES  TECHNIQUE: Single-column upper GI series was performed using water soluble contrast.  CONTRAST:  150 ml Omnipaque 300 per gastrostomy tube.  COMPARISON:  Abdominal pelvic CT 07/05/2013.  FLUOROSCOPY TIME:  38 seconds of pulsed fluoro. Images were acquired with fluoro store mechanism to minimize radiation.  FINDINGS: The scout abdominal radiograph demonstrates no free intraperitoneal air. Percutaneous G-tube and mid abdominal surgical drain are noted. Scattered vascular calcifications, calcified uterine fibroids and degenerative changes of both hips, worse on the right are noted.  Contrast was instilled via the G-tube by hand injection under intermittent fluoroscopy. There is normal filling of the stomach and emptying into the small bowel. The duodenum and jejunum appear normal without significant wall thickening. There is no evidence of perforation. There is no contrast opacification of the surgical drain. No significant gastroesophageal reflux was observed.  IMPRESSION: No significant findings following repair of jejunal perforation. No evidence of bowel leak.   Electronically Signed   By: Camie Patience M.D.   On: 07/10/2013 13:13    Medications / Allergies: per chart  Antibiotics: Anti-infectives   Start     Dose/Rate Route Frequency Ordered Stop   07/07/13  0815  fluconazole (DIFLUCAN) IVPB 200 mg     200 mg 100 mL/hr over 60 Minutes Intravenous Every 24 hours 07/06/13 1030     07/05/13 0815  fluconazole (DIFLUCAN) IVPB 400 mg  Status:  Discontinued     400 mg 100 mL/hr over 120 Minutes Intravenous Every 24 hours 07/05/13 0800 07/06/13 1030   07/05/13 0800  piperacillin-tazobactam (ZOSYN) IVPB 3.375 g     3.375 g 12.5 mL/hr over 240 Minutes Intravenous 3 times per day 07/05/13 0742     07/05/13 0230  piperacillin-tazobactam (ZOSYN) IVPB 3.375 g     3.375 g 100 mL/hr over 30 Minutes Intravenous  Once 07/05/13 0229 07/05/13 0344   07/05/13 0230  metroNIDAZOLE (FLAGYL) IVPB 500 mg     500 mg 100 mL/hr over 60 Minutes Intravenous  Once 07/05/13 0229 07/05/13 0444      Assessment/Plan Acute respiratory failure-s.p laryngeal cancer, chemoradiation.-resolved Afib with RVR-rate controlled. ABL anemia-h&h are stable PCM-advance diet.  Has g-tube Hypothyroidism-start PO home dose Hx ETOH abuse-MVI Perforated jejunal ulcer Exploratory laparotomy, insertion of gastric tube, repair of jejunal ulcer perforation(Dr./[place]  07/05/13) Advance to full liquid diet WBC trending down.  Continue with Zosyn.  Should it increase we will rescan(high risk of abscess formation) Mobilize, Place PT  eval Continue with drain(46mout) DC central line after peripheral IV has been placed.  DC foley Stable to transfer to floor(will discuss with Dr. WHulen Skainsif okay or needs SDU)   EErby Pian AOpt[facility] IncSurgery Pager [phone] [phone] 07/12/2013 10:10 AM

## 2013-07-12 NOTE — Progress Notes (Signed)
Patient looks good.  Transfer to the floor is appropriate.  Kathryne Eriksson. Dahlia Bailiff, MD, [place] [phone] [facility] Surgery

## 2013-07-12 NOTE — Progress Notes (Signed)
Hypoglycemic Event  CBG: 55  Treatment: 1 container of cranberry juice  Symptoms:none  Follow-up CBG: Time:1706 CBG Result:57  Possible Reasons for Event: poor PO intake      Taylor [name] M  Remember to initiate Hypoglycemia Order Set & complete

## 2013-07-12 NOTE — Progress Notes (Signed)
PULMONARY / CRITICAL CARE MEDICINE  Name: Taylor [name] MRN: [id number] DOB: 07/15/1945    ADMISSION DATE:  07/04/2013 CONSULTATION DATE:  07/05/12  REFERRING MD :  [facility]S PRIMARY SERVICE: [facility]  CHIEF COMPLAINT:  Respiratory failure  BRIEF PATIENT DESCRIPTION: 68 yo admitted 1/3 with perforated jejunum requiring emergent laparotomy.  PCCM consulted for post-op medical / vent management.   LINES / TUBES: OETT 1/3 (prolonged and tough intubation in OR with arrest) >>> 1/8 L brach A-lline 1/4 >>> 1/4 R IJ TLC 1/4>>>  (positional, consider PICC when GFR better) Foley 1/3 >>>  CULTURES: 1/4  MRSA PCR >>> neg  ANTIBIOTICS: Zosyn 1/3 >>> Fluconazole 1/3 >>> Flagyl 1/4 >>> 1/5  SIGNIFICANT EVENTS / STUDIES:  1/4  CT abdomen >>> Perforated viscus, most likely the thickened distal duodenum. There is pneumoperitoneum and extraluminal enteric contents. Gallstones or gallbladder sludge.  SUBJECTIVE: Denies chest pain.  Breathing okay.  Not needing supplemental oxygen.  VITAL SIGNS: Temp:  [97.8 F (36.6 C)-99.1 F (37.3 C)] 97.8 F (36.6 C) (01/11 0756) Pulse Rate:  [78-108] 92 (01/11 0800) Resp:  [16-29] 17 (01/11 0800) BP: (100-124)/(53-82) 114/79 mmHg (01/11 0800) SpO2:  [92 %-100 %] 99 % (01/11 0800) Weight:  [97 lb (44 kg)] 97 lb (44 kg) (01/11 0547) HEMODYNAMICS: CVP:  [6 mmHg-7 mmHg] 6 mmHg INTAKE / OUTPUT: Intake/Output     01/10 0701 - 01/11 0700 01/11 0701 - 01/12 0700   P.O.  50   I.V. (mL/kg) 1161.5 (26.4) 50 (1.1)   Other     IV Piggyback 612.5 100   Total Intake(mL/kg) 1774 (40.3) 200 (4.5)   Urine (mL/kg/hr) 2190 (2.1) 175 (2.5)   Drains 40 (0)    Stool 1 (0)    Total Output 2231 175   Net -457 +25         PHYSICAL EXAMINATION: General:  No distress Neuro: awake and alert HEENT: clear, speech improved Cardiovascular:  Regular, no murmurs Lungs: no wheeze Abdomen:  Soft, non tender Musculoskeletal:  Moves all extremities Skin:  Warm/dry, no  edema  LABS:  PULMONARY  Recent Labs Lab 07/05/13 1211 07/05/13 1543 07/06/13 0348 07/06/13 1852 07/08/13 1037  PHART 7.271* 7.242* 7.226* 7.272* 7.472*  PCO2ART 33.0* 38.5 36.6 34.3* 46.2*  PO2ART 114.0* 102.0* 120.0* 119.0* 86.0  HCO3 15.2* 16.5* 15.0* 15.8* 33.9*  TCO2 16 18 16 17  35  O2SAT 98.0 97.0 98.0 98.0 97.0    CBC  Recent Labs Lab 07/10/13 0500 07/11/13 0444 07/12/13 0410  HGB 10.9* 10.5* 9.5*  HCT 31.1* 30.0* 27.1*  WBC 18.0* 18.3* 14.6*  PLT 347 433* 488*    COAGULATION  Recent Labs Lab 07/05/13 0848  INR 1.49    CARDIAC    Recent Labs Lab 07/05/13 0930  TROPONINI <0.30    Recent Labs Lab 07/09/13 0430  PROBNP 17532.0*     CHEMISTRY  Recent Labs Lab 07/06/13 0545  07/08/13 0400 07/09/13 0430 07/09/13 2000 07/10/13 0500 07/11/13 0444 07/11/13 2000 07/12/13 0410  NA 124*  < > 130* 136* 141 142 136* 133* 133*  K 4.4  < > 3.0* 2.5* 3.1* 3.0* 2.8* 3.9 3.9  CL 95*  < > 89* 88* 90* 89* 89* 92* 94*  CO2 15*  < > 30 40* 37* 36* 33* 27 27  GLUCOSE 109*  < > 117* 91 62* 66* 114* 124* 90  BUN 33*  < > 27* 20 18 16 12 10 10   CREATININE 1.99*  < > 1.54*  1.24* 1.12* 1.01 0.89 0.91 0.88  CALCIUM 7.0*  < > 7.5* 7.3* 7.6* 7.8* 7.5* 6.9* 7.0*  MG 1.9  --   --  1.1*  --  0.8*  --   --   --   PHOS  --   --   --  3.5  --  3.8  --   --   --   < > = values in this interval not displayed. Estimated Creatinine Clearance: 43.1 ml/min (by C-G formula based on Cr of 0.88).   LIVER  Recent Labs Lab 07/05/13 0848  INR 1.49     INFECTIOUS  Recent Labs Lab 07/05/13 1200 07/05/13 1612  LATICACIDVEN 1.9 1.8     ENDOCRINE CBG (last 3)   Recent Labs  07/11/13 2014 07/11/13 2355 07/12/13 0754  GLUCAP 128* 94 101*    IMAGING x48h  Dg Chest Port 1 View  07/11/2013   CLINICAL DATA:  Interval extubation, pleural effusions, atelectasis  EXAM: PORTABLE CHEST - 1 VIEW  COMPARISON:  07/09/2013  FINDINGS: Patient has been extubated.  Pleural effusions are smaller with resolving basilar atelectasis as well. Left midlung calcified granuloma again evident. Normal heart size and vascularity. Chronic apical scarring. Negative for pneumothorax. Postop changes in the right axilla. Right IJ central line tip at [facility] as before.  IMPRESSION: Extubated.  Smaller pleural effusions with improving basilar atelectasis   Electronically Signed   By: Daryll Brod M.D.   On: 07/11/2013 08:26   Dg Duanne Limerick W/water Sol Cm  07/10/2013   CLINICAL DATA:  Status post repair of proximal jejunal perforation 07/05/2013. Evaluate for leak.  EXAM: WATER SOLUBLE UPPER GI SERIES  TECHNIQUE: Single-column upper GI series was performed using water soluble contrast.  CONTRAST:  150 ml Omnipaque 300 per gastrostomy tube.  COMPARISON:  Abdominal pelvic CT 07/05/2013.  FLUOROSCOPY TIME:  38 seconds of pulsed fluoro. Images were acquired with fluoro store mechanism to minimize radiation.  FINDINGS: The scout abdominal radiograph demonstrates no free intraperitoneal air. Percutaneous G-tube and mid abdominal surgical drain are noted. Scattered vascular calcifications, calcified uterine fibroids and degenerative changes of both hips, worse on the right are noted.  Contrast was instilled via the G-tube by hand injection under intermittent fluoroscopy. There is normal filling of the stomach and emptying into the small bowel. The duodenum and jejunum appear normal without significant wall thickening. There is no evidence of perforation. There is no contrast opacification of the surgical drain. No significant gastroesophageal reflux was observed.  IMPRESSION: No significant findings following repair of jejunal perforation. No evidence of bowel leak.   Electronically Signed   By: Camie Patience M.D.   On: 07/10/2013 13:13       ASSESSMENT / PLAN:  PULMONARY A: Acute respiratory failure >> difficult airway with hx of laryngeal cancer s/p chemo and XRT. Tobacco abuse. P:   -oxygen to  keep SpO2 > 92% -f/u CXR as needed -scheduled BD's -bronchial hygiene  CARDIOVASCULAR A:  Shock resolved. A fib with RVR >> rate controlled now. P:  -continue cardizem to per tube  RENAL A:   Acute kidney injury, hyponatremia >> resolved. Hypokalemia >> improved. P:   -monitor renal fx, urine outpt -f/u and replace electrolytes as needed  GASTROINTESTINAL A:   Perforated jejunum, s/p laparotomy. Severe protein calorie malnutrition. P:   -post-op care, nutrition per CCS -protonix for SUP  HEMATOLOGIC A:   Acute blood loss anemia. P:  -f/u CBC -SQ heparin for DVT prevention  INFECTIOUS A:  Peritonitis >> improved. P:   -Day 9 of zosyn  ENDOCRINE A:   Hyperglycemia. Hx of hypothyroidism. P:   -SSI -continue levothyroxine   NEUROLOGIC A:   Hx of ETOH abuse. Post-op, cancer pain. P:   -pain control per primary team -continue Thiamine / Folate  Updated partner at bedside.  Medically stable for transfer out of ICU.  PCCM will sign off.  If further medical assistance needed, then recommend consulting [facility] hospitalists.  Chesley Mires, MD [facility] Pulmonary/Critical Care 07/12/2013, 8:37 AM Pager:  [phone] After 3pm call: [phone]

## 2013-07-12 NOTE — Progress Notes (Signed)
Hypoglycemic Event  CBG:57  Treatment: D50 IV 25 mL  Symptoms: None  Follow-up CBG: Time:1755 CBG Result:122  Possible Reasons for Event: poor PO intake     Taylor [name] M  Remember to initiate Hypoglycemia Order Set & complete

## 2013-07-13 DIAGNOSIS — I4891 Unspecified atrial fibrillation: Secondary | ICD-10-CM

## 2013-07-13 LAB — BASIC METABOLIC PANEL
BUN: 9 mg/dL (ref 6–23)
CO2: 22 mEq/L (ref 19–32)
CREATININE: 0.79 mg/dL (ref 0.50–1.10)
Calcium: 7.1 mg/dL — ABNORMAL LOW (ref 8.4–10.5)
Chloride: 92 mEq/L — ABNORMAL LOW (ref 96–112)
GFR calc Af Amer: >90 mL/min (ref >90)
GFR, EST NON AFRICAN AMERICAN: 84 mL/min — AB (ref >90)
Glucose, Bld: 83 mg/dL (ref 70–99)
Potassium: 3.6 mEq/L — ABNORMAL LOW (ref 3.7–5.3)
SODIUM: 129 meq/L — AB (ref 137–147)

## 2013-07-13 LAB — GLUCOSE, CAPILLARY
GLUCOSE-CAPILLARY: 45 mg/dL — AB (ref 70–99)
GLUCOSE-CAPILLARY: 117 mg/dL — AB (ref 70–99)
GLUCOSE-CAPILLARY: 115 mg/dL — AB (ref 70–99)
Glucose-Capillary: 75 mg/dL (ref 70–99)
Glucose-Capillary: 90 mg/dL (ref 70–99)
Glucose-Capillary: 79 mg/dL (ref 70–99)
Glucose-Capillary: 81 mg/dL (ref 70–99)
Glucose-Capillary: 24 mg/dL — CL (ref 70–99)
Glucose-Capillary: 57 mg/dL — ABNORMAL LOW (ref 70–99)

## 2013-07-13 LAB — CBC
HEMATOCRIT: 26.2 % — AB (ref 36.0–46.0)
HEMOGLOBIN: 9.3 g/dL — AB (ref 12.0–15.0)
MCH: 29.4 pg (ref 26.0–34.0)
MCHC: 35.5 g/dL (ref 30.0–36.0)
MCV: 82.9 fL (ref 78.0–100.0)
Platelets: 564 10*3/uL — ABNORMAL HIGH (ref 150–400)
RBC: 3.16 MIL/uL — ABNORMAL LOW (ref 3.87–5.11)
RDW: 16.1 % — ABNORMAL HIGH (ref 11.5–15.5)
WBC: 13.9 10*3/uL — ABNORMAL HIGH (ref 4.0–10.5)

## 2013-07-13 MED ORDER — ALBUTEROL SULFATE (2.5 MG/3ML) 0.083% IN NEBU
2.5000 mg | INHALATION_SOLUTION | Freq: Three times a day (TID) | RESPIRATORY_TRACT | Status: DC
  Administered 2013-07-14: 2.5 mg via RESPIRATORY_TRACT
  Filled 2013-07-13 (×2): qty 3

## 2013-07-13 MED ORDER — GLUCOSE-VITAMIN C 4-6 GM-MG PO CHEW
CHEWABLE_TABLET | ORAL | Status: AC
  Administered 2013-07-13: 4 g
  Filled 2013-07-13: qty 1

## 2013-07-13 MED ORDER — DEXTROSE 50 % IV SOLN
INTRAVENOUS | Status: AC
  Administered 2013-07-13: 50 mL
  Filled 2013-07-13: qty 50

## 2013-07-13 MED ORDER — GLUCOSE-VITAMIN C 4-6 GM-MG PO CHEW: 4.0000 | CHEWABLE_TABLET | ORAL | Status: DC | PRN

## 2013-07-13 MED ORDER — GLUCOSE 40 % PO GEL
1.0000 | ORAL | Status: DC | PRN
  Administered 2013-07-16: 37.5 g via ORAL
  Filled 2013-07-13: qty 1

## 2013-07-13 MED ORDER — GLUCOSE 40 % PO GEL
ORAL | Status: AC
  Administered 2013-07-13: 37.5 g
  Filled 2013-07-13: qty 1

## 2013-07-13 NOTE — Progress Notes (Signed)
Hypoglycemic Event  CBG: 45  Treatment: 15 GM carbohydrate snack and 15 GM gel  Symptoms: None  Follow-up CBG: Time:1705 CBG Result:57  Possible Reasons for Event: Inadequate meal intake  Rechecked at 1733-CBG was 117  Comments/MD notified:Dr. Donne Hazel at [phone]. No new orders.    Oren Bracket L  Remember to initiate Hypoglycemia Order Set & complete

## 2013-07-13 NOTE — Progress Notes (Signed)
IMPROVING.  ADV DIET AND HOPEFULLY DISCHARGE Tuesday.

## 2013-07-13 NOTE — Progress Notes (Signed)
Physical Therapy Treatment Patient Details Name: Taylor [name] MRN: [id number] DOB: May 28, 1946 Today's Date: 07/13/2013 Time: 7169-6789 PT Time Calculation (min): 26 min  PT Assessment / Plan / Recommendation  History of Present Illness 68 yo admitted 1/3 with perforated jejunum requiring emergent exploratory laparotomy with gastrostomy tube placed.  Pt intubated 1/3- 07/09/13.  Pt with h/o ETOH abuse and tobacco use   PT Comments   Patient progressing well with mobility this session. Partner was present throughout and asking several question regarding mobility and techniques in positioning. Patient does have 3 flights of steps to get to apartment. Will work on endurance and attempt stairs soon as patient can tolerate. Encouraged sitting up as much as possible and taking walks to the bathroom vs. Using BSC. Patient very motivated to participate in PT  Follow Up Recommendations  Home health PT;Supervision for mobility/OOB     Does the patient have the potential to tolerate intense rehabilitation     Barriers to Discharge        Equipment Recommendations  Rolling walker with 5" wheels    Recommendations for Other Services    Frequency Min 3X/week   Progress towards PT Goals Progress towards PT goals: Progressing toward goals  Plan Current plan remains appropriate    Precautions / Restrictions Precautions Precautions: Fall;Other (comment) Precaution Comments: multiple lines including abd JP drain & gastrostomy   Pertinent Vitals/Pain no apparent distress     Mobility  Bed Mobility Rolling: Min assist Sidelying to sit: Min assist General bed mobility comments: A for trunk. Cues for technique and positioning.  Transfers Overall transfer level: Needs assistance Equipment used: Rolling walker (2 wheeled) Transfers: Sit to/from Stand Sit to Stand: Min assist General transfer comment: A to transition into stand. Cues to control descent into recliner and for proper technique and  hand placement Ambulation/Gait Ambulation/Gait assistance: Min assist Ambulation Distance (Feet): 100 Feet Assistive device: Rolling walker (2 wheeled) Gait Pattern/deviations: Step-through pattern;Decreased stride length;Trunk flexed;Narrow base of support Gait velocity: decreased General Gait Details: Cues for safe use of RW and for posture within RW. A to manage RW and to ensure balance.     Exercises     PT Diagnosis:    PT Problem List:   PT Treatment Interventions:     PT Goals (current goals can now be found in the care plan section)    Visit Information  Last PT Received On: 07/13/13 Assistance Needed: +1 History of Present Illness: 68 yo admitted 1/3 with perforated jejunum requiring emergent exploratory laparotomy with gastrostomy tube placed.  Pt intubated 1/3- 07/09/13.  Pt with h/o ETOH abuse and tobacco use    Subjective Data      Cognition  Cognition Arousal/Alertness: Awake/alert Behavior During Therapy: WFL for tasks assessed/performed Overall Cognitive Status: Within Functional Limits for tasks assessed    Balance     End of Session PT - End of Session Equipment Utilized During Treatment: Gait belt Activity Tolerance: Patient tolerated treatment well Patient left: in chair;with call bell/phone within reach;with family/visitor present Nurse Communication: Mobility status   GP     Jacqualyn Posey 07/13/2013, 2:45 PM  07/13/2013 Jacqualyn Posey PTA [phone] pager [phone] office

## 2013-07-13 NOTE — Progress Notes (Signed)
Pt. Placed on cardiac monitoring.  CCMD notified.  Will continue to monitor. Syliva Overman

## 2013-07-13 NOTE — Progress Notes (Signed)
Patient ID: Taylor [name], female   DOB: 1946/06/02, 67 y.o.   MRN: [id number]  Subjective: Pt had a BM yesterday, up to bathroom.  No n/v.  Tolerated full liquid diet.  Denies sob, cp, palpitations.    Objective:  Vital signs:  Filed Vitals:   07/12/13 1700 07/12/13 2351 07/13/13 0226 07/13/13 0700  BP: 131/62 105/51 108/56 113/65  Pulse: 104 83 75 103  Temp:  98.1 F (36.7 C) 97.9 F (36.6 C) 98.1 F (36.7 C)  TempSrc:  Oral Oral Oral  Resp: 35 '18 18 18  ' Height:      Weight:      SpO2: 98% 98% 96% 97%    Last BM Date: 07/11/13  Intake/Output   Yesterday:  01/11 0701 - 01/12 0700 In: 31 [P.O.:490; I.V.:170; IV Piggyback:150] Out: 805 [Urine:785; Drains:20]  Physical Exam:  General: Pt awake/alert/oriented x3 in acute distress  Chest: CTA. No chest wall pain w good excursion  CV: Pulses intact. Irregularly irregular. MS: Normal AROM mjr joints. No obvious deformity  Abdomen: Soft. Nondistended. Mildly tender.wound is clean, mild erythema noted around the wound edges. No evidence of peritonitis. Gtube. Drain with serous output.  Ext: SCDs BLE. No mjr edema. No cyanosis  Skin: No petechiae / purpura   Problem List:   Principal Problem:   Perforated ulcer Active Problems:   TOBACCO USE   Difficult airway for intubation   AKI (acute kidney injury)   Protein-calorie malnutrition, moderate   Acute blood loss anemia   S/P exploratory laparotomy    Results:   Labs: Results for orders placed during the hospital encounter of 07/04/13 (from the past 48 hour(s))  GLUCOSE, CAPILLARY     Status: None   Collection Time    07/11/13  8:11 AM      Result Value Range   Glucose-Capillary 95  70 - 99 mg/dL  GLUCOSE, CAPILLARY     Status: Abnormal   Collection Time    07/11/13 12:09 PM      Result Value Range   Glucose-Capillary 115 (*) 70 - 99 mg/dL   Comment 1 Notify RN    GLUCOSE, CAPILLARY     Status: Abnormal   Collection Time    07/11/13  4:01 PM       Result Value Range   Glucose-Capillary 113 (*) 70 - 99 mg/dL   Comment 1 Notify RN    BASIC METABOLIC PANEL     Status: Abnormal   Collection Time    07/11/13  8:00 PM      Result Value Range   Sodium 133 (*) 137 - 147 mEq/L   Potassium 3.9  3.7 - 5.3 mEq/L   Comment: DELTA CHECK NOTED   Chloride 92 (*) 96 - 112 mEq/L   CO2 27  19 - 32 mEq/L   Glucose, Bld 124 (*) 70 - 99 mg/dL   BUN 10  6 - 23 mg/dL   Creatinine, Ser 0.91  0.50 - 1.10 mg/dL   Calcium 6.9 (*) 8.4 - 10.5 mg/dL   GFR calc non Af Amer 64 (*) >90 mL/min   GFR calc Af Amer 74 (*) >90 mL/min   Comment: (NOTE)     The eGFR has been calculated using the CKD EPI equation.     This calculation has not been validated in all clinical situations.     eGFR's persistently <90 mL/min signify possible Chronic Kidney     Disease.  GLUCOSE, CAPILLARY     Status:  Abnormal   Collection Time    07/11/13  8:14 PM      Result Value Range   Glucose-Capillary 128 (*) 70 - 99 mg/dL   Comment 1 Documented in Chart     Comment 2 Notify RN    GLUCOSE, CAPILLARY     Status: None   Collection Time    07/11/13 11:55 PM      Result Value Range   Glucose-Capillary 94  70 - 99 mg/dL   Comment 1 Documented in Chart     Comment 2 Notify RN    GLUCOSE, CAPILLARY     Status: None   Collection Time    07/12/13  3:27 AM      Result Value Range   Glucose-Capillary 98  70 - 99 mg/dL  BASIC METABOLIC PANEL     Status: Abnormal   Collection Time    07/12/13  4:10 AM      Result Value Range   Sodium 133 (*) 137 - 147 mEq/L   Potassium 3.9  3.7 - 5.3 mEq/L   Chloride 94 (*) 96 - 112 mEq/L   CO2 27  19 - 32 mEq/L   Glucose, Bld 90  70 - 99 mg/dL   BUN 10  6 - 23 mg/dL   Creatinine, Ser 0.88  0.50 - 1.10 mg/dL   Calcium 7.0 (*) 8.4 - 10.5 mg/dL   GFR calc non Af Amer 66 (*) >90 mL/min   GFR calc Af Amer 77 (*) >90 mL/min   Comment: (NOTE)     The eGFR has been calculated using the CKD EPI equation.     This calculation has not been validated  in all clinical situations.     eGFR's persistently <90 mL/min signify possible Chronic Kidney     Disease.  CBC WITH DIFFERENTIAL     Status: Abnormal   Collection Time    07/12/13  4:10 AM      Result Value Range   WBC 14.6 (*) 4.0 - 10.5 K/uL   RBC 3.23 (*) 3.87 - 5.11 MIL/uL   Hemoglobin 9.5 (*) 12.0 - 15.0 g/dL   HCT 27.1 (*) 36.0 - 46.0 %   MCV 83.9  78.0 - 100.0 fL   MCH 29.4  26.0 - 34.0 pg   MCHC 35.1  30.0 - 36.0 g/dL   RDW 16.2 (*) 11.5 - 15.5 %   Platelets 488 (*) 150 - 400 K/uL   Neutrophils Relative % 85 (*) 43 - 77 %   Lymphocytes Relative 5 (*) 12 - 46 %   Monocytes Relative 7  3 - 12 %   Eosinophils Relative 3  0 - 5 %   Basophils Relative 0  0 - 1 %   Neutro Abs 12.5 (*) 1.7 - 7.7 K/uL   Lymphs Abs 0.7  0.7 - 4.0 K/uL   Monocytes Absolute 1.0  0.1 - 1.0 K/uL   Eosinophils Absolute 0.4  0.0 - 0.7 K/uL   Basophils Absolute 0.0  0.0 - 0.1 K/uL   RBC Morphology TARGET CELLS     WBC Morphology TOXIC GRANULATION    GLUCOSE, CAPILLARY     Status: Abnormal   Collection Time    07/12/13  7:54 AM      Result Value Range   Glucose-Capillary 101 (*) 70 - 99 mg/dL   Comment 1 Notify RN    GLUCOSE, CAPILLARY     Status: None   Collection Time    07/12/13 11:47  AM      Result Value Range   Glucose-Capillary 92  70 - 99 mg/dL  GLUCOSE, CAPILLARY     Status: Abnormal   Collection Time    07/12/13  4:04 PM      Result Value Range   Glucose-Capillary 55 (*) 70 - 99 mg/dL   Comment 1 Notify RN    GLUCOSE, CAPILLARY     Status: Abnormal   Collection Time    07/12/13  5:01 PM      Result Value Range   Glucose-Capillary 57 (*) 70 - 99 mg/dL   Comment 1 Notify RN    GLUCOSE, CAPILLARY     Status: Abnormal   Collection Time    07/12/13  5:55 PM      Result Value Range   Glucose-Capillary 122 (*) 70 - 99 mg/dL  GLUCOSE, CAPILLARY     Status: None   Collection Time    07/12/13  8:01 PM      Result Value Range   Glucose-Capillary 91  70 - 99 mg/dL   Comment 1 Notify  RN    GLUCOSE, CAPILLARY     Status: None   Collection Time    07/12/13 11:39 PM      Result Value Range   Glucose-Capillary 82  70 - 99 mg/dL  GLUCOSE, CAPILLARY     Status: None   Collection Time    07/13/13 12:46 AM      Result Value Range   Glucose-Capillary 90  70 - 99 mg/dL   Comment 1 Notify RN    GLUCOSE, CAPILLARY     Status: None   Collection Time    07/13/13  3:47 AM      Result Value Range   Glucose-Capillary 75  70 - 99 mg/dL   Comment 1 Notify RN    CBC     Status: Abnormal   Collection Time    07/13/13  4:41 AM      Result Value Range   WBC 13.9 (*) 4.0 - 10.5 K/uL   RBC 3.16 (*) 3.87 - 5.11 MIL/uL   Hemoglobin 9.3 (*) 12.0 - 15.0 g/dL   HCT 26.2 (*) 36.0 - 46.0 %   MCV 82.9  78.0 - 100.0 fL   MCH 29.4  26.0 - 34.0 pg   MCHC 35.5  30.0 - 36.0 g/dL   RDW 16.1 (*) 11.5 - 15.5 %   Platelets 564 (*) 150 - 400 K/uL  BASIC METABOLIC PANEL     Status: Abnormal   Collection Time    07/13/13  4:41 AM      Result Value Range   Sodium 129 (*) 137 - 147 mEq/L   Potassium 3.6 (*) 3.7 - 5.3 mEq/L   Chloride 92 (*) 96 - 112 mEq/L   CO2 22  19 - 32 mEq/L   Glucose, Bld 83  70 - 99 mg/dL   BUN 9  6 - 23 mg/dL   Creatinine, Ser 0.79  0.50 - 1.10 mg/dL   Calcium 7.1 (*) 8.4 - 10.5 mg/dL   GFR calc non Af Amer 84 (*) >90 mL/min   GFR calc Af Amer >90  >90 mL/min   Comment: (NOTE)     The eGFR has been calculated using the CKD EPI equation.     This calculation has not been validated in all clinical situations.     eGFR's persistently <90 mL/min signify possible Chronic Kidney     Disease.  Imaging / Studies: No results found.  Medications / Allergies: per chart  Antibiotics: Anti-infectives   Start     Dose/Rate Route Frequency Ordered Stop   07/07/13 0815  fluconazole (DIFLUCAN) IVPB 200 mg     200 mg 100 mL/hr over 60 Minutes Intravenous Every 24 hours 07/06/13 1030     07/05/13 0815  fluconazole (DIFLUCAN) IVPB 400 mg  Status:  Discontinued     400  mg 100 mL/hr over 120 Minutes Intravenous Every 24 hours 07/05/13 0800 07/06/13 1030   07/05/13 0800  piperacillin-tazobactam (ZOSYN) IVPB 3.375 g     3.375 g 12.5 mL/hr over 240 Minutes Intravenous 3 times per day 07/05/13 0742     07/05/13 0230  piperacillin-tazobactam (ZOSYN) IVPB 3.375 g     3.375 g 100 mL/hr over 30 Minutes Intravenous  Once 07/05/13 0229 07/05/13 0344   07/05/13 0230  metroNIDAZOLE (FLAGYL) IVPB 500 mg     500 mg 100 mL/hr over 60 Minutes Intravenous  Once 07/05/13 0229 07/05/13 0444     Assessment/Plan  Acute respiratory failure-s.p laryngeal cancer, chemoradiation.-resolved  Afib with RVR-rate controlled. Place on tele.  ABL anemia-h&h are stable  PCM-advance diet. Has g-tube, will remove at discharge.  Hypothyroidism-home meds Hx ETOH abuse-MVI  Perforated jejunal ulcer  Exploratory laparotomy, insertion of gastric tube, repair of jejunal ulcer perforation(Dr./W[place]field 07/05/13)  Advance to soft diet WBC trending down. Continue with Zosyn. Should it increase we will rescan(high risk of abscess formation)  Zosyn D#8 BID wet to dry dressing changes. Mobilize, Place PT eval  drain(41mout) serous, ok to DC? Dispo-anticipate DC tomorrow pending PT eval. Lives with daughter     EErby Pian AEm[facility]and[facility] HospitalSurgery Pager [phone] [phone] 07/13/2013 7:52 AM

## 2013-07-13 NOTE — Progress Notes (Signed)
Occupational Therapy Treatment Patient Details Name: Taylor [name] MRN: [id number] DOB: September 20, 1945 Today's Date: 07/13/2013 Time: 3016-0109 OT Time Calculation (min): 23 min  OT Assessment / Plan / Recommendation  History of present illness 68 yo admitted 1/3 with perforated jejunum requiring emergent exploratory laparotomy with gastrostomy tube placed.  Pt intubated 1/3- 07/09/13.  Pt with h/o ETOH abuse and tobacco use   OT comments  This pt is making good progress and will have needed A at home.  Follow Up Recommendations  No OT follow up;Supervision - Intermittent             Frequency Min 2X/week   Progress towards OT Goals Progress towards OT goals: Progressing toward goals  Plan Discharge plan remains appropriate    Precautions / Restrictions Precautions Precautions: Fall Precaution Comments: multiple lines including abd JP drain & gastrostomy Restrictions Weight Bearing Restrictions: No   Pertinent Vitals/Pain No C/O   ADL  Toilet Transfer: Supervision/safety Toilet Transfer Method: Sit to Loss adjuster, chartered: Bedside commode (in bathroom) Toileting - Clothing Manipulation and Hygiene: Supervision/safety Where Assessed - Toileting Clothing Manipulation and Hygiene: Sit to stand from 3-in-1 or toilet Transfers/Ambulation Related to ADLs: S for all with RW ADL Comments: Partner in room reports that she will A pt with all B/D A she may need when she gets home. I did encourage pt to do her own bathing yesterday after the staff set her up with what she needs and pt was open to this--knows not to stand up unless someone is with her. Pt did ambulate with me out in the hallway to try and increase her endurance. i did recommend to pt and partner that if pt wants to get in the shower they may want to consider a seat due to pt's decreased endurance.     OT Goals(current goals can now be found in the care plan section)    Visit Information  Last OT Received On:  07/13/13 Assistance Needed: +1 History of Present Illness: 68 yo admitted 1/3 with perforated jejunum requiring emergent exploratory laparotomy with gastrostomy tube placed.  Pt intubated 1/3- 07/09/13.  Pt with h/o ETOH abuse and tobacco use          Cognition  Cognition Arousal/Alertness: Awake/alert Behavior During Therapy: WFL for tasks assessed/performed Overall Cognitive Status: Within Functional Limits for tasks assessed    Mobility  Bed Mobility Overal bed mobility: Needs Assistance Bed Mobility: Supine to Sit;Sit to Supine Supine to sit: Supervision;HOB elevated Sit to supine: Supervision;HOB elevated Transfers Overall transfer level: Needs assistance Equipment used: Rolling walker (2 wheeled) Transfers: Sit to/from Stand Sit to Stand: Min guard Stand pivot transfers: Min guard          End of Session OT - End of Session Equipment Utilized During Treatment: Rolling walker Activity Tolerance: Patient tolerated treatment well Patient left: in bed;with call bell/phone within reach;with nursing/sitter in room    Almon Register [phone] 07/13/2013, 4:33 PM

## 2013-07-14 LAB — GLUCOSE, CAPILLARY
GLUCOSE-CAPILLARY: 106 mg/dL — AB (ref 70–99)
GLUCOSE-CAPILLARY: 111 mg/dL — AB (ref 70–99)
Glucose-Capillary: 88 mg/dL (ref 70–99)
Glucose-Capillary: 79 mg/dL (ref 70–99)
Glucose-Capillary: 80 mg/dL (ref 70–99)
Glucose-Capillary: 142 mg/dL — ABNORMAL HIGH (ref 70–99)
Glucose-Capillary: 93 mg/dL (ref 70–99)

## 2013-07-14 MED ORDER — ENSURE COMPLETE PO LIQD
237.0000 mL | Freq: Two times a day (BID) | ORAL | Status: DC
  Administered 2013-07-15: 237 mL via ORAL

## 2013-07-14 MED ORDER — ALBUTEROL SULFATE (2.5 MG/3ML) 0.083% IN NEBU: 2.5000 mg | INHALATION_SOLUTION | RESPIRATORY_TRACT | Status: DC | PRN

## 2013-07-14 MED ORDER — AMOXICILLIN-POT CLAVULANATE 875-125 MG PO TABS
1.0000 | ORAL_TABLET | Freq: Two times a day (BID) | ORAL | Status: DC
  Administered 2013-07-14 – 2013-07-15 (×3): 1 via ORAL
  Filled 2013-07-14 (×4): qty 1

## 2013-07-14 MED ORDER — BOOST / RESOURCE BREEZE PO LIQD
1.0000 | Freq: Two times a day (BID) | ORAL | Status: DC
  Administered 2013-07-14: 1 via ORAL

## 2013-07-14 NOTE — Progress Notes (Signed)
9 Days Post-Op  Subjective: Pt feels much better.  Pain well controlled.  No N/V tolerating D3 diet well.  Ambulated some OOB, but isn't feeling confident about strength.    Objective: Vital signs in last 24 hours: Temp:  [97.3 F (36.3 C)-98 F (36.7 C)] 98 F (36.7 C) (01/13 0500) Pulse Rate:  [82-93] 82 (01/13 0500) Resp:  [18] 18 (01/13 0500) BP: (107-119)/(56-72) 109/65 mmHg (01/13 0500) SpO2:  [94 %-98 %] 96 % (01/13 0717) Last BM Date: 07/12/13  Intake/Output from previous day: 01/12 0701 - 01/13 0700 In: 220 [P.O.:220] Out: 10 [Drains:10] Intake/Output this shift:    PE: Gen:  Alert, NAD, pleasant Abd: Soft, minimal tenderness, ND, +BS, no HSM, wound is mostly clean with some yellowish tan slough, some areas of good beefy red granulation tissue along the edges, drain with minimal serous drainage   Lab Results:   Recent Labs  07/12/13 0410 07/13/13 0441  WBC 14.6* 13.9*  HGB 9.5* 9.3*  HCT 27.1* 26.2*  PLT 488* 564*   BMET  Recent Labs  07/12/13 0410 07/13/13 0441  NA 133* 129*  K 3.9 3.6*  CL 94* 92*  CO2 27 22  GLUCOSE 90 83  BUN 10 9  CREATININE 0.88 0.79  CALCIUM 7.0* 7.1*   PT/INR No results found for this basename: LABPROT, INR,  in the last 72 hours CMP     Component Value Date/Time   NA 129* 07/13/2013 0441   NA 133* 04/01/2013 1134   NA 136 03/14/2011 0954   K 3.6* 07/13/2013 0441   K 4.2 04/01/2013 1134   K 5.5* 03/14/2011 0954   CL 92* 07/13/2013 0441   CL 96* 10/08/2012 0911   CL 96* 03/14/2011 0954   CO2 22 07/13/2013 0441   CO2 27 04/01/2013 1134   CO2 26 03/14/2011 0954   GLUCOSE 83 07/13/2013 0441   GLUCOSE 106 04/01/2013 1134   GLUCOSE 112* 10/08/2012 0911   GLUCOSE 113 03/14/2011 0954   BUN 9 07/13/2013 0441   BUN 9.9 04/01/2013 1134   BUN 11 03/14/2011 0954   CREATININE 0.79 07/13/2013 0441   CREATININE 0.7 04/01/2013 1134   CREATININE 0.5* 03/14/2011 0954   CALCIUM 7.1* 07/13/2013 0441   CALCIUM 9.5 04/01/2013 1134   CALCIUM 9.5  03/14/2011 0954   PROT 6.9 07/04/2013 2245   PROT 6.9 04/01/2013 1134   PROT 8.0 03/14/2011 0954   ALBUMIN 3.0* 07/04/2013 2245   ALBUMIN 3.3* 04/01/2013 1134   AST 20 07/04/2013 2245   AST 19 04/01/2013 1134   AST 30 03/14/2011 0954   ALT 9 07/04/2013 2245   ALT 10 04/01/2013 1134   ALT 22 03/14/2011 0954   ALKPHOS 68 07/04/2013 2245   ALKPHOS 117 04/01/2013 1134   ALKPHOS 101* 03/14/2011 0954   BILITOT 0.4 07/04/2013 2245   BILITOT 0.27 04/01/2013 1134   BILITOT 0.50 03/14/2011 0954   GFRNONAA 84* 07/13/2013 0441   GFRAA >90 07/13/2013 0441   Lipase     Component Value Date/Time   LIPASE 150* 07/04/2013 2245       Studies/Results: No results found.  Anti-infectives: Anti-infectives   Start     Dose/Rate Route Frequency Ordered Stop   07/07/13 0815  fluconazole (DIFLUCAN) IVPB 200 mg     200 mg 100 mL/hr over 60 Minutes Intravenous Every 24 hours 07/06/13 1030     07/05/13 0815  fluconazole (DIFLUCAN) IVPB 400 mg  Status:  Discontinued     400 mg  100 mL/hr over 120 Minutes Intravenous Every 24 hours 07/05/13 0800 07/06/13 1030   07/05/13 0800  piperacillin-tazobactam (ZOSYN) IVPB 3.375 g     3.375 g 12.5 mL/hr over 240 Minutes Intravenous 3 times per day 07/05/13 0742     07/05/13 0230  piperacillin-tazobactam (ZOSYN) IVPB 3.375 g     3.375 g 100 mL/hr over 30 Minutes Intravenous  Once 07/05/13 0229 07/05/13 0344   07/05/13 0230  metroNIDAZOLE (FLAGYL) IVPB 500 mg     500 mg 100 mL/hr over 60 Minutes Intravenous  Once 07/05/13 0229 07/05/13 0444       Assessment/Plan Acute respiratory failure-s.p laryngeal cancer, chemoradiation - resolved  Afib with RVR-rate controlled. D/c tele ABL anemia-h&h are stable  PCM-advance diet. Has g-tube. Hypothyroidism-home meds  Hx ETOH abuse-MVI  Perforated jejunal ulcer  Exploratory laparotomy, insertion of gastric tube, repair of jejunal ulcer perforation(Dr. Donne Hazel 07/05/13)   Plan: 1.  Tolerating D3 diet 2.  WBC trending down at 13.9.   Should it increase we will rescan (high risk of abscess formation), Zosyn D#9, switch to Augmentin and recheck tomorrow 3.  BID wet to dry dressing changes.  4.  Mobilize, Place PT eval  5.  D/c Jp drain with minimal output , will leave in G tube until she follow up with Dr. Donne Hazel in the office as an OP given her h/o of laryngeal cancer and chemoradiation; may want to leave in long term  Dispo-anticipate DC tomorrow pending PT eval. May need to stay to see if WBC rises after switching to orals.  Recommending intermittent supervision.  Lives with partner/roommate.      LOS: 10 days    Coralie Keens 07/14/2013, 10:25 AM Pager: [phone]

## 2013-07-14 NOTE — Care Management Note (Signed)
    Page 1 of 2   07/27/2013     11:18:36 AM   CARE MANAGEMENT NOTE 07/27/2013  Patient:  Taylor [name], Taylor [name]   Account Number:  [id number]  Date Initiated:  07/06/2013  Documentation initiated by:  [facility]  Subjective/Objective Assessment:   ADmitted with abd pain - bowel perf - exp lap - post op ICU on vent and pressors.     Action/Plan:   Anticipated DC Date:     Anticipated DC Plan:  [place]  CM consult      Choice offered to / List presented to:  C-1 Patient   DME arranged  3-N-1  [place]      DME agency  [place] agency  [facility] [place]   Status of service:  In process, will continue to follow Medicare Important Message given?   (If response is "NO", the following Medicare IM given date fields will be blank) Date Medicare IM given:   Date Additional Medicare IM given:    Discharge Disposition:    Per UR Regulation:  Reviewed for med. necessity/level of care/duration of stay  If discussed at [place] of Stay Meetings, dates discussed:    Comments:  ContactJoen [name] [phone]   07-27-13 Plan has changed to home with hospice . Provided Taylor [name] and patient with list of agencies . Picked [facility], referral given to [facility] aware discharge planned for today  information faxed to [phone] . Package B home oxygen and NEB machine ordered [name] with [facility] aware discharge today .  Taylor [name] confirmed 3 in 1 , walker and Ensure has already been delivered to home . Social work aware will need ambulance transport home.   07-14-13 Spoke with patient and her friend / roommate Taylor [name] cell [phone] . Taylor [name] can assist patient  at home willing to be shown how to pack wound , states she did it for her mom.  They would like to use [facility] , they would also like rolling walker  and 3 in 1 .  Patient does live on third floor.   Taylor Spatz RN BSN [phone]

## 2013-07-14 NOTE — Progress Notes (Signed)
Physical Therapy Treatment Patient Details Name: Taylor [name] MRN: [id number] DOB: 08/05/1945 Today's Date: 07/14/2013 Time: 1245-8099 PT Time Calculation (min): 27 min  PT Assessment / Plan / Recommendation  History of Present Illness 68 yo admitted 1/3 with perforated jejunum requiring emergent exploratory laparotomy with gastrostomy tube placed.  Pt intubated 1/3- 07/09/13.  Pt with h/o ETOH abuse and tobacco use   PT Comments   Patient continues to be motivated and progressing with ambulation. Will attempt stairs tomorrow.   Follow Up Recommendations  Home health PT;Supervision for mobility/OOB     Does the patient have the potential to tolerate intense rehabilitation     Barriers to Discharge        Equipment Recommendations  Rolling walker with 5" wheels    Recommendations for Other Services    Frequency Min 3X/week   Progress towards PT Goals Progress towards PT goals: Progressing toward goals  Plan Current plan remains appropriate    Precautions / Restrictions Precautions Precautions: Fall Precaution Comments: multiple lines including abd JP drain & gastrostomy   Pertinent Vitals/Pain no apparent distress    Mobility  Transfers Overall transfer level: Needs assistance Sit to Stand: Min guard Stand pivot transfers: Min guard General transfer comment: Cues for safe hand placement Ambulation/Gait Ambulation/Gait assistance: Min guard Ambulation Distance (Feet): 250 Feet Assistive device: Rolling walker (2 wheeled) Gait Pattern/deviations: Step-through pattern Gait velocity: increasing General Gait Details: Patient with better posture this session and management of RW    Exercises     PT Diagnosis:    PT Problem List:   PT Treatment Interventions:     PT Goals (current goals can now be found in the care plan section)    Visit Information  Last PT Received On: 07/14/13 Assistance Needed: +1 History of Present Illness: 68 yo admitted 1/3 with  perforated jejunum requiring emergent exploratory laparotomy with gastrostomy tube placed.  Pt intubated 1/3- 07/09/13.  Pt with h/o ETOH abuse and tobacco use    Subjective Data      Cognition  Cognition Arousal/Alertness: Awake/alert Behavior During Therapy: WFL for tasks assessed/performed Overall Cognitive Status: Within Functional Limits for tasks assessed    Balance     End of Session PT - End of Session Activity Tolerance: Patient tolerated treatment well Patient left: in chair;with call bell/phone within reach;with family/visitor present Nurse Communication: Mobility status   GP     Jacqualyn Posey 07/14/2013, 1:51 PM 07/14/2013 Jacqualyn Posey PTA [phone] pager [phone] office

## 2013-07-14 NOTE — Progress Notes (Signed)
NUTRITION FOLLOW UP  Intervention:    Continue Ensure Complete BID  Provide Resource Breeze BID RD to follow for nutrition care plan  Nutrition Dx:   Inadequate oral intake related to inability to eat, s/p extubation as evidenced by NPO status, ongoing  Goal:   Pt to meet >/= 90% of their estimated nutrition needs, currently unmet  Monitor:   PO diet advancement & intake, weight, labs, I/O's  Assessment:   Patient with PMH of throat & breast cancer who presented with 24 hour history of abdominal pain; 1/4 CT abdomen showed perforated viscus, most likely the thickened distal duodenum.   Patient s/p procedure 1/4:  EXPLORATORY LAPAROTOMY  PRIMARY REPAIR OF PERFORATED JEJUNAL INTERVAL WITH OMENTAL PATCH  GASTROSTOMY TUBE PLACEMENT  Patient extubated 1/8.  NGT out with extubation.   Diet advanced to full liquid 1/11 and Dysphagia 3 1/12. Per nursing notes, pt ate about 20% of her breakfast today. Pt is 1 lb below usual body weight. Pt states she is eating better today than she has the past few days- reports eating 20% of meals and drinking Ensure Complete once daily. Pt states swallowing difficulty and coughing with liquids is her major challenge when eating.   Height: Ht Readings from Last 1 Encounters:  07/05/13 5' 4.17" (1.63 m)    Weight Status:   Wt Readings from Last 1 Encounters:  07/12/13 97 lb (44 kg)    Re-estimated needs:  Kcal: 1700-1900 Protein: 85-95 gm Fluid: 1.7-1.9 L  Skin: surgical incisions (neck, sacrum, abdomen); generalized edema   Last BM: 1/11  Diet Order: Dysphagia   Intake/Output Summary (Last 24 hours) at 07/14/13 1517 Last data filed at 07/14/13 0900  Gross per 24 hour  Intake    340 ml  Output     10 ml  Net    330 ml    Labs:   Recent Labs Lab 07/08/13 0400 07/09/13 0430  07/10/13 0500  07/11/13 2000 07/12/13 0410 07/13/13 0441  NA 130* 136*  < > 142  < > 133* 133* 129*  K 3.0* 2.5*  < > 3.0*  < > 3.9 3.9 3.6*  CL 89*  88*  < > 89*  < > 92* 94* 92*  CO2 30 40*  < > 36*  < > 27 27 22   BUN 27* 20  < > 16  < > 10 10 9   CREATININE 1.54* 1.24*  < > 1.01  < > 0.91 0.88 0.79  CALCIUM 7.5* 7.3*  < > 7.8*  < > 6.9* 7.0* 7.1*  MG  --  1.1*  --  0.8*  --   --   --   --   PHOS  --  3.5  --  3.8  --   --   --   --   GLUCOSE 117* 91  < > 66*  < > 124* 90 83  < > = values in this interval not displayed.  CBG (last 3)   Recent Labs  07/14/13 0810 07/14/13 1113 07/14/13 1208  GLUCAP 111* 79 80    Scheduled Meds: . amoxicillin-clavulanate  1 tablet Oral Q12H  . antiseptic oral rinse  15 mL Mouth Rinse QID  . chlorhexidine  15 mL Mouth Rinse BID  . diltiazem  60 mg Per Tube Q6H  . feeding supplement (ENSURE COMPLETE)  237 mL Oral TID WC  . fluconazole (DIFLUCAN) IV  200 mg Intravenous Q24H  . folic acid  1 mg Oral Daily  .  heparin subcutaneous  5,000 Units Subcutaneous Q8H  . insulin aspart  0-15 Units Subcutaneous Q4H  . ketorolac  15 mg Intravenous Q6H  . levothyroxine  50 mcg Oral QAC breakfast  . pantoprazole (PROTONIX) IV  40 mg Intravenous Q24H  . thiamine  100 mg Oral Daily    Continuous Infusions:   Pryor Ochoa RD, LDN Inpatient Clinical Dietitian Pager: [phone] After Hours Pager: [phone]

## 2013-07-14 NOTE — Progress Notes (Signed)
ANTIBIOTIC CONSULT NOTE - Follow-up  Pharmacy Consult for Zosyn Indication: perforated jejunum, s/p laparotomy   Allergies  Allergen Reactions  . Ace Inhibitors Cough  . Hydrocodone Itching  . Hydromorphone Itching  . Tylox [Oxycodone-Acetaminophen] Itching  . Sulfa Antibiotics Itching and Rash    Patient Measurements: Height: 5' 4.17" (163 cm) Weight: 97 lb (44 kg) IBW/kg (Calculated) : 55.1  Vital Signs: Temp: 98 F (36.7 C) (01/13 0500) Temp src: Oral (01/13 0500) BP: 109/65 mmHg (01/13 0500) Pulse Rate: 82 (01/13 0500) Intake/Output from previous day: 01/12 0701 - 01/13 0700 In: 220 [P.O.:220] Out: 10 [Drains:10] Intake/Output from this shift:    Labs:  Recent Labs  07/11/13 2000 07/12/13 0410 07/13/13 0441  WBC  --  14.6* 13.9*  HGB  --  9.5* 9.3*  PLT  --  488* 564*  CREATININE 0.91 0.88 0.79   Estimated Creatinine Clearance: 47.4 ml/min (by C-G formula based on Cr of 0.79). No results found for this basename: VANCOTROUGH, VANCOPEAK, VANCORANDOM, GENTTROUGH, GENTPEAK, GENTRANDOM, TOBRATROUGH, TOBRAPEAK, TOBRARND, AMIKACINPEAK, AMIKACINTROU, AMIKACIN,  in the last 72 hours   Assessment: 68 yo F with history of throat cancer and breast cancer who presented with worsening abdominal pain. Found to have a perforated jejunum and now s/p laparotomy. She continues on zosyn Pt is afebrile and WBC is 13.9  She is also on fluconazole. C.diff negative.   1/4 Zosyn>> 1/4 Flucon>>  Goal of Therapy:  Resolution of possible infection  Plan:  1. Continue zosyn 3.375gm IV Q8H (4 hr inf) 2. F/u renal fxn, C&S, clinical status   Excell Seltzer, PharmD.  Clinical Pharmacist Pager [phone]

## 2013-07-14 NOTE — Progress Notes (Signed)
Agree. Need to access home situation. Looks ok medically.

## 2013-07-15 ENCOUNTER — Inpatient Hospital Stay (HOSPITAL_COMMUNITY): Payer: Medicare Other

## 2013-07-15 ENCOUNTER — Encounter (HOSPITAL_COMMUNITY): Payer: Self-pay | Admitting: Radiology

## 2013-07-15 DIAGNOSIS — D473 Essential (hemorrhagic) thrombocythemia: Secondary | ICD-10-CM

## 2013-07-15 LAB — URINALYSIS, ROUTINE W REFLEX MICROSCOPIC
Bilirubin Urine: NEGATIVE
Glucose, UA: NEGATIVE mg/dL
Hgb urine dipstick: NEGATIVE
Ketones, ur: NEGATIVE mg/dL
LEUKOCYTES UA: NEGATIVE
Nitrite: NEGATIVE
PROTEIN: 30 mg/dL — AB
Specific Gravity, Urine: 1.02 (ref 1.005–1.030)
Urobilinogen, UA: 0.2 mg/dL (ref 0.0–1.0)
pH: 5.5 (ref 5.0–8.0)

## 2013-07-15 LAB — GLUCOSE, CAPILLARY
GLUCOSE-CAPILLARY: 76 mg/dL (ref 70–99)
GLUCOSE-CAPILLARY: 113 mg/dL — AB (ref 70–99)
Glucose-Capillary: 100 mg/dL — ABNORMAL HIGH (ref 70–99)
Glucose-Capillary: 108 mg/dL — ABNORMAL HIGH (ref 70–99)
Glucose-Capillary: 36 mg/dL — CL (ref 70–99)
Glucose-Capillary: 82 mg/dL (ref 70–99)
Glucose-Capillary: 86 mg/dL (ref 70–99)

## 2013-07-15 LAB — CBC
HCT: 26.4 % — ABNORMAL LOW (ref 36.0–46.0)
HEMOGLOBIN: 9.3 g/dL — AB (ref 12.0–15.0)
MCH: 28.6 pg (ref 26.0–34.0)
MCHC: 35.2 g/dL (ref 30.0–36.0)
MCV: 81.2 fL (ref 78.0–100.0)
Platelets: 826 10*3/uL — ABNORMAL HIGH (ref 150–400)
RBC: 3.25 MIL/uL — AB (ref 3.87–5.11)
RDW: 16.1 % — ABNORMAL HIGH (ref 11.5–15.5)
WBC: 23.4 10*3/uL — ABNORMAL HIGH (ref 4.0–10.5)

## 2013-07-15 LAB — URINE MICROSCOPIC-ADD ON

## 2013-07-15 MED ORDER — BENZOCAINE 10 % MT GEL
Freq: Four times a day (QID) | OROMUCOSAL | Status: DC | PRN
  Filled 2013-07-15 (×2): qty 9.4

## 2013-07-15 MED ORDER — BOOST / RESOURCE BREEZE PO LIQD
1.0000 | Freq: Three times a day (TID) | ORAL | Status: DC
  Administered 2013-07-16 – 2013-07-19 (×7): 1 via ORAL

## 2013-07-15 MED ORDER — ALBUTEROL SULFATE (2.5 MG/3ML) 0.083% IN NEBU: 2.5000 mg | INHALATION_SOLUTION | RESPIRATORY_TRACT | Status: DC

## 2013-07-15 MED ORDER — DEXTROSE-NACL 5-0.9 % IV SOLN
INTRAVENOUS | Status: DC
  Administered 2013-07-15 – 2013-07-21 (×11): via INTRAVENOUS

## 2013-07-15 MED ORDER — MENTHOL 3 MG MT LOZG
1.0000 | LOZENGE | OROMUCOSAL | Status: DC | PRN
  Filled 2013-07-15: qty 9

## 2013-07-15 MED ORDER — IPRATROPIUM BROMIDE 0.02 % IN SOLN: 0.5000 mg | RESPIRATORY_TRACT | Status: DC

## 2013-07-15 MED ORDER — SODIUM CHLORIDE 0.9 % IV SOLN
1.0000 g | INTRAVENOUS | Status: DC
  Administered 2013-07-15 – 2013-07-21 (×7): 1 g via INTRAVENOUS
  Filled 2013-07-15 (×9): qty 1

## 2013-07-15 MED ORDER — GUAIFENESIN ER 600 MG PO TB12
600.0000 mg | ORAL_TABLET | Freq: Two times a day (BID) | ORAL | Status: DC | PRN
  Administered 2013-07-15 – 2013-07-26 (×14): 600 mg via ORAL
  Filled 2013-07-15 (×14): qty 1

## 2013-07-15 MED ORDER — IPRATROPIUM-ALBUTEROL 0.5-2.5 (3) MG/3ML IN SOLN
3.0000 mL | RESPIRATORY_TRACT | Status: DC
  Administered 2013-07-15 – 2013-07-16 (×5): 3 mL via RESPIRATORY_TRACT
  Filled 2013-07-15 (×5): qty 3

## 2013-07-15 MED ORDER — IOHEXOL 300 MG/ML  SOLN
100.0000 mL | Freq: Once | INTRAMUSCULAR | Status: AC | PRN
  Administered 2013-07-15: 100 mL via INTRAVENOUS

## 2013-07-15 MED ORDER — IOHEXOL 300 MG/ML  SOLN
25.0000 mL | INTRAMUSCULAR | Status: AC
  Administered 2013-07-15 (×2): 25 mL via ORAL

## 2013-07-15 MED ORDER — DEXTROSE 50 % IV SOLN
25.0000 mL | Freq: Once | INTRAVENOUS | Status: AC
  Administered 2013-07-15: 25 mL via INTRAVENOUS
  Filled 2013-07-15: qty 50

## 2013-07-15 NOTE — Progress Notes (Signed)
See if collections are drainable.

## 2013-07-15 NOTE — H&P (Signed)
Taylor [name] is an 68 y.o. female.   Chief Complaint: pt with hx esophageal ca and breast ca Recent surgery for perforated jejunal ulcer 07/05/13 Gastric tube placed and ulcer repair Now with new abdominal collections per CT Scheduled for abd abscess drain placement (poss 2)  HPI: HTN; hypothyroid; esoph ca; breast ca  Past Medical History  Diagnosis Date  . Hyperlipidemia     STATES SHE NO LONGER HAS HI CHOLESTEROL  . Rhinitis   . Fatigue 12/10/2011  . Hypothyroid 12/11/2011  . Hypertension     hx of, no current med for last 3 years  . Throat cancer 2010  . Breast CA 2008    right breast  . Headache(784.0)     tension  . Nausea alone 04/01/2013  . Difficult airway     Past Surgical History  Procedure Laterality Date  . Panendoscopy  05/16/2011    Procedure: PANENDOSCOPY;  Surgeon: Tyson Alias;  Location: [place];  Service: ENT;  Laterality: N/A;  Direct laryngoscopy, esophagoscopy  . Radical neck dissection  05/16/2011    Procedure: RADICAL NECK DISSECTION;  Surgeon: Tyson Alias;  Location: [place];  Service: ENT;  Laterality: Left;  Modified radical left neck dissection   . Appendectomy  yrs ago  . Cysts removed from back abd breast  yrs ago  . Breast lumpectomy Right 2008  . Esophagogastroduodenoscopy N/A 01/13/2013    Procedure: ESOPHAGOGASTRODUODENOSCOPY (EGD);  Surgeon: Cleotis Nipper, MD;  Location: [facility];  Service: Endoscopy;  Laterality: N/A;  . Balloon dilation N/A 01/13/2013    Procedure: BALLOON DILATION;  Surgeon: Cleotis Nipper, MD;  Location: [facility];  Service: Endoscopy;  Laterality: N/A;  . Laparotomy N/A 07/05/2013    Procedure: EXPLORATORY LAPAROTOMY, insertion of gastrostomy tube, repair jejunal ulcer perforation.;  Surgeon: Rolm Bookbinder, MD;  Location: [facility] OR;  Service: General;  Laterality: N/A;    Family History  Problem Relation Age of Onset  . Hypertension    . Throat cancer    . Rheum arthritis    . Hypertension Mother    . Hyperlipidemia Mother   . Throat cancer Father    Social History:  reports that she has been smoking Cigarettes.  She has a 80 pack-year smoking history. She has never used smokeless tobacco. She reports that she drinks alcohol. She reports that she does not use illicit drugs.  Allergies:  Allergies  Allergen Reactions  . Ace Inhibitors Cough  . Hydrocodone Itching  . Hydromorphone Itching  . Tylox [Oxycodone-Acetaminophen] Itching  . Sulfa Antibiotics Itching and Rash    Medications Prior to Admission  Medication Sig Dispense Refill  . acetaminophen (TYLENOL) 160 MG/5ML elixir Take 15 mg/kg by mouth every 4 (four) hours as needed for fever.      Marland Kitchen acetaminophen (TYLENOL) 325 MG tablet Take 650 mg by mouth every 6 (six) hours as needed for pain.       Marland Kitchen desoximetasone (TOPICORT) 0.25 % cream Apply 1 application topically 2 (two) times daily. Apply to affected area       . diclofenac (VOLTAREN) 75 MG EC tablet Take 75 mg by mouth 2 (two) times daily.       Marland Kitchen levothyroxine (SYNTHROID, LEVOTHROID) 50 MCG tablet Take 1 tablet (50 mcg total) by mouth daily before breakfast.  90 tablet  6  . LORazepam (ATIVAN) 0.5 MG tablet Take 1 tablet (0.5 mg total) by mouth every 8 (eight) hours as needed for anxiety.  90 tablet  3  . magnesium hydroxide (MILK OF MAGNESIA) 800 MG/5ML suspension Take 15 mLs by mouth daily as needed for constipation.      Marland Kitchen omeprazole (PRILOSEC) 20 MG capsule Take 1 capsule (20 mg total) by mouth daily.  90 capsule  3  . ondansetron (ZOFRAN) 8 MG tablet Take 1 tablet (8 mg total) by mouth every 8 (eight) hours as needed for nausea.  90 tablet  3  . oxymetazoline (AFRIN) 0.05 % nasal spray Place 1 spray into the nose 2 (two) times daily.      . Pseudoephed-APAP-Guaifenesin (SUDAFED TRIPLE ACTION PO) Take 1 tablet by mouth 2 (two) times daily.      . simethicone (MYLICON) 664 MG chewable tablet Chew 125 mg by mouth every 6 (six) hours as needed for flatulence.         Results for orders placed during the hospital encounter of 07/04/13 (from the past 48 hour(s))  GLUCOSE, CAPILLARY     Status: Abnormal   Collection Time    07/13/13  4:02 PM      Result Value Range   Glucose-Capillary 24 (*) 70 - 99 mg/dL   Comment 1 Repeat Test    GLUCOSE, CAPILLARY     Status: Abnormal   Collection Time    07/13/13  4:18 PM      Result Value Range   Glucose-Capillary 45 (*) 70 - 99 mg/dL   Comment 1 Notify RN    GLUCOSE, CAPILLARY     Status: Abnormal   Collection Time    07/13/13  5:05 PM      Result Value Range   Glucose-Capillary 57 (*) 70 - 99 mg/dL  GLUCOSE, CAPILLARY     Status: Abnormal   Collection Time    07/13/13  5:33 PM      Result Value Range   Glucose-Capillary 117 (*) 70 - 99 mg/dL  GLUCOSE, CAPILLARY     Status: Abnormal   Collection Time    07/13/13  8:16 PM      Result Value Range   Glucose-Capillary 115 (*) 70 - 99 mg/dL  GLUCOSE, CAPILLARY     Status: None   Collection Time    07/14/13 12:00 AM      Result Value Range   Glucose-Capillary 88  70 - 99 mg/dL  GLUCOSE, CAPILLARY     Status: Abnormal   Collection Time    07/14/13  4:12 AM      Result Value Range   Glucose-Capillary 106 (*) 70 - 99 mg/dL  GLUCOSE, CAPILLARY     Status: Abnormal   Collection Time    07/14/13  8:10 AM      Result Value Range   Glucose-Capillary 111 (*) 70 - 99 mg/dL   Comment 1 Notify RN    GLUCOSE, CAPILLARY     Status: None   Collection Time    07/14/13 11:13 AM      Result Value Range   Glucose-Capillary 79  70 - 99 mg/dL   Comment 1 Notify RN    GLUCOSE, CAPILLARY     Status: None   Collection Time    07/14/13 12:08 PM      Result Value Range   Glucose-Capillary 80  70 - 99 mg/dL  GLUCOSE, CAPILLARY     Status: Abnormal   Collection Time    07/14/13  4:15 PM      Result Value Range   Glucose-Capillary 142 (*) 70 - 99 mg/dL  GLUCOSE, CAPILLARY  Status: None   Collection Time    07/14/13  7:56 PM      Result Value Range    Glucose-Capillary 93  70 - 99 mg/dL  GLUCOSE, CAPILLARY     Status: None   Collection Time    07/15/13 12:04 AM      Result Value Range   Glucose-Capillary 82  70 - 99 mg/dL   Comment 1 Notify RN     Comment 2 Documented in Chart    CBC     Status: Abnormal   Collection Time    07/15/13  4:17 AM      Result Value Range   WBC 23.4 (*) 4.0 - 10.5 K/uL   RBC 3.25 (*) 3.87 - 5.11 MIL/uL   Hemoglobin 9.3 (*) 12.0 - 15.0 g/dL   HCT 26.4 (*) 36.0 - 46.0 %   MCV 81.2  78.0 - 100.0 fL   MCH 28.6  26.0 - 34.0 pg   MCHC 35.2  30.0 - 36.0 g/dL   RDW 16.1 (*) 11.5 - 15.5 %   Platelets 826 (*) 150 - 400 K/uL   Comment: REPEATED TO VERIFY     DELTA CHECK NOTED  GLUCOSE, CAPILLARY     Status: Abnormal   Collection Time    07/15/13  4:22 AM      Result Value Range   Glucose-Capillary 100 (*) 70 - 99 mg/dL   Comment 1 Notify RN     Comment 2 Documented in Chart    GLUCOSE, CAPILLARY     Status: None   Collection Time    07/15/13  7:59 AM      Result Value Range   Glucose-Capillary 86  70 - 99 mg/dL  URINALYSIS, ROUTINE W REFLEX MICROSCOPIC     Status: Abnormal   Collection Time    07/15/13  9:06 AM      Result Value Range   Color, Urine YELLOW  YELLOW   APPearance CLOUDY (*) CLEAR   Specific Gravity, Urine 1.020  1.005 - 1.030   pH 5.5  5.0 - 8.0   Glucose, UA NEGATIVE  NEGATIVE mg/dL   Hgb urine dipstick NEGATIVE  NEGATIVE   Bilirubin Urine NEGATIVE  NEGATIVE   Ketones, ur NEGATIVE  NEGATIVE mg/dL   Protein, ur 30 (*) NEGATIVE mg/dL   Urobilinogen, UA 0.2  0.0 - 1.0 mg/dL   Nitrite NEGATIVE  NEGATIVE   Leukocytes, UA NEGATIVE  NEGATIVE  URINE MICROSCOPIC-ADD ON     Status: Abnormal   Collection Time    07/15/13  9:06 AM      Result Value Range   Squamous Epithelial / LPF FEW (*) RARE   WBC, UA 3-6  <3 WBC/hpf   RBC / HPF 0-2  <3 RBC/hpf   Bacteria, UA RARE  RARE  GLUCOSE, CAPILLARY     Status: Abnormal   Collection Time    07/15/13 12:34 PM      Result Value Range    Glucose-Capillary 36 (*) 70 - 99 mg/dL   Comment 1 Repeat Test    GLUCOSE, CAPILLARY     Status: Abnormal   Collection Time    07/15/13  1:14 PM      Result Value Range   Glucose-Capillary 108 (*) 70 - 99 mg/dL   Ct Abdomen Pelvis W Contrast  07/15/2013   CLINICAL DATA:  68 year old female status post jejunal ulcer repair and drain removal. Increasing leukocytosis. Initial encounter.  EXAM: CT ABDOMEN AND PELVIS WITH CONTRAST  TECHNIQUE: Multidetector CT imaging of the abdomen and pelvis was performed using the standard protocol following bolus administration of intravenous contrast.  CONTRAST:  122mL OMNIPAQUE IOHEXOL 300 MG/ML  SOLN  COMPARISON:  Preoperative CT Abdomen and Pelvis 07/05/2013.  FINDINGS: New moderate volume layering left pleural effusion. Combined compressive atelectasis and more confluent left lower lobe pulmonary opacity adjacent to the effusion. No pericardial effusion. Trace layering right pleural effusion.  Ventral abdominal wound healing by secondary intention. New percutaneous gastrostomy tube in place. Small volume pneumoperitoneum.  Subdiaphragmatic perisplenic fluid (series 2, image 14) appears to be organizing and rim enhancing on the coronal images (series 5, image 103). This is about 7 cm diameter and 2 cm in thickness. Multilobulated rim enhancing abscess partially within the lesser sac located near the area of bowel perforation on the preoperative study (series 2, image 33). The most confluent portion of this abscess encompasses 7.2 x 3.8 cm.  Oral contrast within the bowel which has reached the rectum. No bowel obstruction. Small volume of mesenteric gas in the left abdomen (series 2, image 46). The cecum is distended. Redundant right colon. No focally inflamed bowel is identified.  Negative liver and gallbladder. Stable calcified granulomas in the spleen. Pancreatic parenchyma within normal limits. Adrenal glands within normal limits. Portal venous system is patent.  Aortoiliac calcified atherosclerosis noted. Major arterial structures in the abdomen and pelvis are patent. Mildly distended bladder. Calcified fibroid uterus.  No acute osseous abnormality identified.  IMPRESSION: 1. Left upper quadrant subphrenic and small bowel mesenteric abscesses, including tracking into the lesser sac. 2. Moderate layering left pleural effusion with left lower lobe atelectasis versus developing pneumonia. 3. No bowel obstruction status post proximal jejunal repair. No extravasated oral contrast. Trace residual pneumoperitoneum.   Electronically Signed   By: Lars Pinks M.D.   On: 07/15/2013 12:47   Dg Chest Port 1 View  07/15/2013   CLINICAL DATA:  Leukocytosis, cough, history throat and breast cancer  EXAM: PORTABLE CHEST - 1 VIEW  COMPARISON:  Portable exam 0807 hr compared to 07/11/2013  FINDINGS: Normal heart size, mediastinal contours and pulmonary vascularity.  Left lower lobe infiltrate question pneumonia.  Small left pleural effusion.  Remaining lungs clear.  No pneumothorax.  Right axillary surgical clips.  Bones demineralized.  IMPRESSION: Left lower lobe pneumonia with small left pleural effusion.   Electronically Signed   By: Lavonia Dana M.D.   On: 07/15/2013 08:19   Dg Abd Portable 1v  07/15/2013   CLINICAL DATA:  Leukocytosis after JP drain removal and change in antibiotics, history breast and throat cancer  EXAM: PORTABLE ABDOMEN - 1 VIEW  COMPARISON:  Portable exam 0810 hr compared to 07/04/2013  FINDINGS: Gastrostomy tube left upper quadrant.  Retained contrast in colon.  No bowel wall thickening or evidence of obstruction.  Degenerative changes of bilateral hip joints, severe on right with femoral head remodeling and superolateral subluxation.  IMPRESSION: Nonspecific bowel gas pattern.   Electronically Signed   By: Lavonia Dana M.D.   On: 07/15/2013 08:21    Review of Systems  Constitutional: Positive for weight loss. Negative for fever.  Respiratory: Negative for  shortness of breath.   Cardiovascular: Negative for chest pain.  Gastrointestinal: Positive for nausea and abdominal pain.  Neurological: Positive for weakness.    Blood pressure 112/55, pulse 87, temperature 98.2 F (36.8 C), temperature source Oral, resp. rate 16, height 5' 4.17" (1.63 m), weight 97 lb (44 kg), SpO2 95.00%. Physical Exam  Constitutional:  She is oriented to person, place, and time.  Thin frail  Cardiovascular: Normal rate and regular rhythm.   No murmur heard. Respiratory: Effort normal and breath sounds normal. She has no wheezes.  GI: Soft. There is tenderness.  Musculoskeletal: Normal range of motion.  Neurological: She is alert and oriented to person, place, and time.  Skin: Skin is dry.  Psychiatric: She has a normal mood and affect. Her behavior is normal. Judgment and thought content normal.     Assessment/Plan New abd abscesses post perf jejunal ulcer surgery 07/05/13 Scheduled now for abd abscess drain placement (poss 2) Pt and family aware of procedure benefits and risks and agreeable to proceed Consent signed andin chart Hep Inj held 1/15   [place] A 07/15/2013, 1:36 PM

## 2013-07-15 NOTE — Progress Notes (Signed)
Just viewed the patients CT scan.  She has Left upper quadrant subphrenic and small bowel mesenteric abscesses including tracking into the lesser sac.  Moderate left pleural effusion with LLB atelectasis vs developing pneumonia.  Talked with Dr. Brantley Stage who recommends IR drain placement if possible.  Will switch antibiotics from Augmentin to Invanz given she was resistant to 9 days of Zosyn prior to switching to Augmentin.  Have made her NPO for potential IR procedure.

## 2013-07-15 NOTE — Progress Notes (Signed)
Seen examined and agree.

## 2013-07-15 NOTE — Progress Notes (Signed)
10 Days Post-Op  Subjective: Pt feels fine.  Tolerating diet well, no N/V.  Having BM's and urinating well.  Ambulating OOB with therapies recommending HH PT/OT.  Pt denies urinary symptoms, CT/SOB, no increase in pain in abdomen.    Objective: Vital signs in last 24 hours: Temp:  [97.8 F (36.6 C)-98.2 F (36.8 C)] 98.2 F (36.8 C) (01/14 0529) Pulse Rate:  [87-100] 87 (01/14 0529) Resp:  [16-18] 16 (01/14 0529) BP: (112-139)/(55-60) 112/55 mmHg (01/14 0529) SpO2:  [95 %-96 %] 95 % (01/14 0529) Last BM Date: 07/12/13  Intake/Output from previous day: 01/13 0701 - 01/14 0700 In: 450 [P.O.:450] Out: -  Intake/Output this shift:    PE: Gen:  Alert, NAD, pleasant Card:  RRR, no M/G/R heard Pulm:  CTA, no W/R/R, good effort Abd: Soft, ND, minimal tenderness over open wound, but tolerated dressing change, +BS, no HSM, midline wound is mostly clean with some yellowish tan slough, some areas of good beefy red granulation tissue along the edges, G tube site with mucousy foul smelling drainage and some erythema and induration    Lab Results:   Recent Labs  07/13/13 0441 07/15/13 0417  WBC 13.9* 23.4*  HGB 9.3* 9.3*  HCT 26.2* 26.4*  PLT 564* 826*   BMET  Recent Labs  07/13/13 0441  NA 129*  K 3.6*  CL 92*  CO2 22  GLUCOSE 83  BUN 9  CREATININE 0.79  CALCIUM 7.1*   PT/INR No results found for this basename: LABPROT, INR,  in the last 72 hours CMP     Component Value Date/Time   NA 129* 07/13/2013 0441   NA 133* 04/01/2013 1134   NA 136 03/14/2011 0954   K 3.6* 07/13/2013 0441   K 4.2 04/01/2013 1134   K 5.5* 03/14/2011 0954   CL 92* 07/13/2013 0441   CL 96* 10/08/2012 0911   CL 96* 03/14/2011 0954   CO2 22 07/13/2013 0441   CO2 27 04/01/2013 1134   CO2 26 03/14/2011 0954   GLUCOSE 83 07/13/2013 0441   GLUCOSE 106 04/01/2013 1134   GLUCOSE 112* 10/08/2012 0911   GLUCOSE 113 03/14/2011 0954   BUN 9 07/13/2013 0441   BUN 9.9 04/01/2013 1134   BUN 11 03/14/2011 0954   CREATININE 0.79 07/13/2013 0441   CREATININE 0.7 04/01/2013 1134   CREATININE 0.5* 03/14/2011 0954   CALCIUM 7.1* 07/13/2013 0441   CALCIUM 9.5 04/01/2013 1134   CALCIUM 9.5 03/14/2011 0954   PROT 6.9 07/04/2013 2245   PROT 6.9 04/01/2013 1134   PROT 8.0 03/14/2011 0954   ALBUMIN 3.0* 07/04/2013 2245   ALBUMIN 3.3* 04/01/2013 1134   AST 20 07/04/2013 2245   AST 19 04/01/2013 1134   AST 30 03/14/2011 0954   ALT 9 07/04/2013 2245   ALT 10 04/01/2013 1134   ALT 22 03/14/2011 0954   ALKPHOS 68 07/04/2013 2245   ALKPHOS 117 04/01/2013 1134   ALKPHOS 101* 03/14/2011 0954   BILITOT 0.4 07/04/2013 2245   BILITOT 0.27 04/01/2013 1134   BILITOT 0.50 03/14/2011 0954   GFRNONAA 84* 07/13/2013 0441   GFRAA >90 07/13/2013 0441   Lipase     Component Value Date/Time   LIPASE 150* 07/04/2013 2245       Studies/Results: No results found.  Anti-infectives: Anti-infectives   Start     Dose/Rate Route Frequency Ordered Stop   07/14/13 1045  amoxicillin-clavulanate (AUGMENTIN) 875-125 MG per tablet 1 tablet     1 tablet Oral  Every 12 hours 07/14/13 1033     07/07/13 0815  fluconazole (DIFLUCAN) IVPB 200 mg     200 mg 100 mL/hr over 60 Minutes Intravenous Every 24 hours 07/06/13 1030     07/05/13 0815  fluconazole (DIFLUCAN) IVPB 400 mg  Status:  Discontinued     400 mg 100 mL/hr over 120 Minutes Intravenous Every 24 hours 07/05/13 0800 07/06/13 1030   07/05/13 0800  piperacillin-tazobactam (ZOSYN) IVPB 3.375 g  Status:  Discontinued     3.375 g 12.5 mL/hr over 240 Minutes Intravenous 3 times per day 07/05/13 0742 07/14/13 1033   07/05/13 0230  piperacillin-tazobactam (ZOSYN) IVPB 3.375 g     3.375 g 100 mL/hr over 30 Minutes Intravenous  Once 07/05/13 0229 07/05/13 0344   07/05/13 0230  metroNIDAZOLE (FLAGYL) IVPB 500 mg     500 mg 100 mL/hr over 60 Minutes Intravenous  Once 07/05/13 0229 07/05/13 0444       Assessment/Plan Acute respiratory failure-s.p laryngeal cancer, chemoradiation - resolved  Afib  with RVR-rate controlled. D/c tele  ABL anemia-h&h are stable  PCM-advance diet. Has g-tube.  Hypothyroidism-home meds  Hx ETOH abuse-MVI  Perforated jejunal ulcer  POD #10 s/p Exploratory laparotomy, insertion of gastric tube, repair of jejunal ulcer perforation(Dr. Donne Hazel 07/05/13)  Leukocytosis - spiked to 23,000 from 13,000 Thrombocytosis - 826 - may need to start aspirin  Plan:  1. Tolerating D3 diet, back to clears for CT scan 2. WBC spiked to 23,000. Should it increase we will rescan (high risk of abscess formation), Zosyn D#9, switched to yesterday Augmentin D#2 3. BID wet to dry dressing changes. - wound with slough, but does not look infected, mucousy foul smelling drainage from G tube site - may be source of infection? 4. Mobilize, PT/OT 5. Jp drain d/c yesterday with minimal output, will leave in G tube until she follow up with Dr. Donne Hazel in the office as an OP given her h/o of laryngeal cancer and chemoradiation; may want to leave in long term  6.  SCD's and heparin SQ Dispo - With WBC increasing need to find source of infection, order CT scan abdomen, KUB and CXR at bedside, and urinalysis.  The patient is asymptomatic and normal vitals and afebrile. No plans for d/c today.     LOS: 11 days    DORT, Jinny Blossom 07/15/2013, 7:56 AM Pager: [phone]

## 2013-07-15 NOTE — Progress Notes (Addendum)
PT Cancellation Note  Patient Details Name: Taylor [name] MRN: [id number] DOB: 1945/10/03   Cancelled Treatment:    Reason Eval/Treat Not Completed: Patient at procedure or test/unavailable. Attempted to see patient however patient getting washed up by nurse tech and stated she was about to go for CT. Will follow up this afternoon as able.   Jacqualyn Posey 07/15/2013, 11:47 AM  Attempted to see patient again this afternoon, however patient tired from test and is awaiting lunch. Plan for drain placement tomorrow in IR. Will continue to follow patient and see as able 07/15/2013 Jacqualyn Posey PTA [phone] pager [phone] office

## 2013-07-15 NOTE — Progress Notes (Signed)
NUTRITION FOLLOW UP  Intervention:    Continue Ensure Complete BID when diet advanced  Provide Resource Breeze TID RD to follow for nutrition care plan  Nutrition Dx:   Inadequate oral intake related to inability to eat, s/p extubation as evidenced by NPO status, ongoing  Goal:   Pt to meet >/= 90% of their estimated nutrition needs, currently unmet  Monitor:   PO diet advancement & intake, weight, labs, I/O's  Assessment:   Patient with PMH of throat & breast cancer who presented with 24 hour history of abdominal pain; 1/4 CT abdomen showed perforated viscus, most likely the thickened distal duodenum.   Patient s/p procedure 1/4:  EXPLORATORY LAPAROTOMY  PRIMARY REPAIR OF PERFORATED JEJUNAL INTERVAL WITH OMENTAL PATCH  GASTROSTOMY TUBE PLACEMENT  Patient extubated 1/8.  NGT out with extubation.   Diet advanced to full liquid 1/11 and Dysphagia 3 1/12.   Pt did not eat any breakfast or lunch today. Diet downgraded to clear liquids today and pt will be NPO at midnight. Pt provided with Resource Breeze supplement at time of visit and pt will receive clear liquid dinner tray. Will continue to monitor diet advancement and PO intake  Height: Ht Readings from Last 1 Encounters:  07/05/13 5' 4.17" (1.63 m)    Weight Status:   Wt Readings from Last 1 Encounters:  07/12/13 97 lb (44 kg)    Re-estimated needs:  Kcal: 1700-1900 Protein: 85-95 gm Fluid: 1.7-1.9 L  Skin: surgical incisions (neck, sacrum, abdomen); generalized edema   Last BM: 1/11  Diet Order: Clear Liquid   Intake/Output Summary (Last 24 hours) at 07/15/13 1530 Last data filed at 07/14/13 2300  Gross per 24 hour  Intake    330 ml  Output      0 ml  Net    330 ml    Labs:   Recent Labs Lab 07/09/13 0430  07/10/13 0500  07/11/13 2000 07/12/13 0410 07/13/13 0441  NA 136*  < > 142  < > 133* 133* 129*  K 2.5*  < > 3.0*  < > 3.9 3.9 3.6*  CL 88*  < > 89*  < > 92* 94* 92*  CO2 40*  < > 36*  < >  27 27 22   BUN 20  < > 16  < > 10 10 9   CREATININE 1.24*  < > 1.01  < > 0.91 0.88 0.79  CALCIUM 7.3*  < > 7.8*  < > 6.9* 7.0* 7.1*  MG 1.1*  --  0.8*  --   --   --   --   PHOS 3.5  --  3.8  --   --   --   --   GLUCOSE 91  < > 66*  < > 124* 90 83  < > = values in this interval not displayed.  CBG (last 3)   Recent Labs  07/15/13 1234 07/15/13 1314 07/15/13 1521  GLUCAP 36* 108* 76    Scheduled Meds: . antiseptic oral rinse  15 mL Mouth Rinse QID  . chlorhexidine  15 mL Mouth Rinse BID  . diltiazem  60 mg Per Tube Q6H  . ertapenem  1 g Intravenous Q24H  . feeding supplement (ENSURE COMPLETE)  237 mL Oral BID BM  . feeding supplement (RESOURCE BREEZE)  1 Container Oral BID AC  . fluconazole (DIFLUCAN) IV  200 mg Intravenous Q24H  . folic acid  1 mg Oral Daily  . heparin subcutaneous  5,000 Units Subcutaneous  Q8H  . insulin aspart  0-15 Units Subcutaneous Q4H  . ipratropium-albuterol  3 mL Nebulization Q4H  . ketorolac  15 mg Intravenous Q6H  . levothyroxine  50 mcg Oral QAC breakfast  . pantoprazole (PROTONIX) IV  40 mg Intravenous Q24H  . thiamine  100 mg Oral Daily    Continuous Infusions: . dextrose 5 % and 0.9% NaCl 75 mL/hr at 07/15/13 [place], LDN Inpatient Clinical Dietitian Pager: [phone] After Hours Pager: [phone]

## 2013-07-16 ENCOUNTER — Inpatient Hospital Stay (HOSPITAL_COMMUNITY): Payer: Medicare Other

## 2013-07-16 LAB — GLUCOSE, CAPILLARY
GLUCOSE-CAPILLARY: 74 mg/dL (ref 70–99)
GLUCOSE-CAPILLARY: 63 mg/dL — AB (ref 70–99)
Glucose-Capillary: 101 mg/dL — ABNORMAL HIGH (ref 70–99)
Glucose-Capillary: 124 mg/dL — ABNORMAL HIGH (ref 70–99)
Glucose-Capillary: 140 mg/dL — ABNORMAL HIGH (ref 70–99)
Glucose-Capillary: 160 mg/dL — ABNORMAL HIGH (ref 70–99)
Glucose-Capillary: 64 mg/dL — ABNORMAL LOW (ref 70–99)
Glucose-Capillary: 81 mg/dL (ref 70–99)
Glucose-Capillary: 86 mg/dL (ref 70–99)

## 2013-07-16 LAB — BASIC METABOLIC PANEL
BUN: 9 mg/dL (ref 6–23)
CO2: 21 mEq/L (ref 19–32)
CREATININE: 0.73 mg/dL (ref 0.50–1.10)
Calcium: 6.6 mg/dL — ABNORMAL LOW (ref 8.4–10.5)
Chloride: 92 mEq/L — ABNORMAL LOW (ref 96–112)
GFR calc Af Amer: >90 mL/min (ref >90)
GFR, EST NON AFRICAN AMERICAN: 86 mL/min — AB (ref >90)
GLUCOSE: 131 mg/dL — AB (ref 70–99)
POTASSIUM: 2.5 meq/L — AB (ref 3.7–5.3)
Sodium: 128 mEq/L — ABNORMAL LOW (ref 137–147)

## 2013-07-16 LAB — PATHOLOGIST SMEAR REVIEW

## 2013-07-16 LAB — CBC
HEMATOCRIT: 24 % — AB (ref 36.0–46.0)
HEMOGLOBIN: 8.5 g/dL — AB (ref 12.0–15.0)
MCH: 28.6 pg (ref 26.0–34.0)
MCHC: 35.4 g/dL (ref 30.0–36.0)
MCV: 80.8 fL (ref 78.0–100.0)
Platelets: 942 10*3/uL (ref 150–400)
RBC: 2.97 MIL/uL — ABNORMAL LOW (ref 3.87–5.11)
RDW: 15.8 % — ABNORMAL HIGH (ref 11.5–15.5)
WBC: 17.3 10*3/uL — ABNORMAL HIGH (ref 4.0–10.5)

## 2013-07-16 LAB — PROTIME-INR
INR: 1.21 (ref 0.00–1.49)
Prothrombin Time: 15 seconds (ref 11.6–15.2)

## 2013-07-16 LAB — POTASSIUM: POTASSIUM: 3.2 meq/L — AB (ref 3.7–5.3)

## 2013-07-16 MED ORDER — IPRATROPIUM-ALBUTEROL 0.5-2.5 (3) MG/3ML IN SOLN
3.0000 mL | RESPIRATORY_TRACT | Status: DC | PRN
  Administered 2013-07-25 – 2013-07-27 (×4): 3 mL via RESPIRATORY_TRACT
  Filled 2013-07-16 (×5): qty 3

## 2013-07-16 MED ORDER — IPRATROPIUM-ALBUTEROL 0.5-2.5 (3) MG/3ML IN SOLN: 3.0000 mL | Freq: Three times a day (TID) | RESPIRATORY_TRACT | Status: DC

## 2013-07-16 MED ORDER — ENSURE COMPLETE PO LIQD
237.0000 mL | Freq: Two times a day (BID) | ORAL | Status: DC
  Administered 2013-07-18 – 2013-07-19 (×4): 237 mL via ORAL

## 2013-07-16 MED ORDER — MIDAZOLAM HCL 2 MG/2ML IJ SOLN
INTRAMUSCULAR | Status: AC | PRN
  Administered 2013-07-16 (×3): 1 mg via INTRAVENOUS

## 2013-07-16 MED ORDER — IPRATROPIUM-ALBUTEROL 0.5-2.5 (3) MG/3ML IN SOLN
3.0000 mL | Freq: Three times a day (TID) | RESPIRATORY_TRACT | Status: DC
  Administered 2013-07-16 – 2013-07-27 (×31): 3 mL via RESPIRATORY_TRACT
  Filled 2013-07-16 (×32): qty 3

## 2013-07-16 MED ORDER — POTASSIUM CHLORIDE 20 MEQ/15ML (10%) PO LIQD
60.0000 meq | Freq: Once | ORAL | Status: AC
  Administered 2013-07-16: 60 meq via ORAL
  Filled 2013-07-16: qty 45

## 2013-07-16 MED ORDER — FENTANYL CITRATE 0.05 MG/ML IJ SOLN
INTRAMUSCULAR | Status: AC
  Filled 2013-07-16: qty 4

## 2013-07-16 MED ORDER — MIDAZOLAM HCL 2 MG/2ML IJ SOLN
INTRAMUSCULAR | Status: AC
  Filled 2013-07-16: qty 4

## 2013-07-16 MED ORDER — DEXTROSE 50 % IV SOLN
INTRAVENOUS | Status: AC
  Administered 2013-07-16: 25 mL
  Filled 2013-07-16: qty 50

## 2013-07-16 MED ORDER — LIDOCAINE HCL 1 % IJ SOLN
INTRAMUSCULAR | Status: AC
  Filled 2013-07-16: qty 10

## 2013-07-16 MED ORDER — FENTANYL CITRATE 0.05 MG/ML IJ SOLN
INTRAMUSCULAR | Status: AC | PRN
  Administered 2013-07-16 (×2): 25 ug via INTRAVENOUS

## 2013-07-16 MED ORDER — POTASSIUM CHLORIDE 10 MEQ/100ML IV SOLN
10.0000 meq | INTRAVENOUS | Status: AC
  Administered 2013-07-16 (×3): 10 meq via INTRAVENOUS
  Filled 2013-07-16 (×3): qty 100

## 2013-07-16 NOTE — Progress Notes (Signed)
CRITICAL VALUE ALERT  Critical value received:potassium level 2.5  Date of notification:07/16/13  Time of notification: 9381  Critical value read back: yes  Nurse who received alert: L. Dionicia Abler  MD notified (1st page):Dr Rosendo Gros Time of first page:0538  MD notified (2nd page) Dr Rosendo Gros  Time of second OFBP:1025  Responding MD: Dr Rosendo Gros   Time MD responded:  yes

## 2013-07-16 NOTE — Progress Notes (Signed)
11 Days Post-Op  Subjective: Pt feels okay.  No N/V, NPO currently.  Had a BM this am, urinating well.  Tolerating dressing changes.  Mobilizing well with PT/OT.  No fevers, WBC down to 17.3.  Notes less drainage from G-tube site than before.  Objective: Vital signs in last 24 hours: Temp:  [97 F (36.1 C)-98.3 F (36.8 C)] 97.8 F (36.6 C) (01/15 0554) Pulse Rate:  [85-90] 85 (01/15 0554) Resp:  [16-18] 16 (01/15 0554) BP: (106-118)/(52-59) 108/59 mmHg (01/15 0554) SpO2:  [91 %-94 %] 94 % (01/15 0554) Last BM Date: 07/15/13  Intake/Output from previous day: 01/14 0701 - 01/15 0700 In: 748.8 [P.O.:170; I.V.:578.8] Out: 925 [Urine:925] Intake/Output this shift: Total I/O In: 676.3 [I.V.:676.3] Out: 250 [Urine:250]  PE: Gen:  Alert, NAD, pleasant Card:  RRR, no M/G/R heard Pulm:  Course breath sounds at bases anteriorly and laterally, good effort, IS at 750. Abd: Soft, ND, minimal tenderness over open wound, but tolerated dressing change, +BS, no HSM   Lab Results:   Recent Labs  07/15/13 0417 07/16/13 0415  WBC 23.4* 17.3*  HGB 9.3* 8.5*  HCT 26.4* 24.0*  PLT 826* 942*   BMET  Recent Labs  07/16/13 0415  NA 128*  K 2.5*  CL 92*  CO2 21  GLUCOSE 131*  BUN 9  CREATININE 0.73  CALCIUM 6.6*   PT/INR  Recent Labs  07/16/13 0415  LABPROT 15.0  INR 1.21   CMP     Component Value Date/Time   NA 128* 07/16/2013 0415   NA 133* 04/01/2013 1134   NA 136 03/14/2011 0954   K 2.5* 07/16/2013 0415   K 4.2 04/01/2013 1134   K 5.5* 03/14/2011 0954   CL 92* 07/16/2013 0415   CL 96* 10/08/2012 0911   CL 96* 03/14/2011 0954   CO2 21 07/16/2013 0415   CO2 27 04/01/2013 1134   CO2 26 03/14/2011 0954   GLUCOSE 131* 07/16/2013 0415   GLUCOSE 106 04/01/2013 1134   GLUCOSE 112* 10/08/2012 0911   GLUCOSE 113 03/14/2011 0954   BUN 9 07/16/2013 0415   BUN 9.9 04/01/2013 1134   BUN 11 03/14/2011 0954   CREATININE 0.73 07/16/2013 0415   CREATININE 0.7 04/01/2013 1134   CREATININE  0.5* 03/14/2011 0954   CALCIUM 6.6* 07/16/2013 0415   CALCIUM 9.5 04/01/2013 1134   CALCIUM 9.5 03/14/2011 0954   PROT 6.9 07/04/2013 2245   PROT 6.9 04/01/2013 1134   PROT 8.0 03/14/2011 0954   ALBUMIN 3.0* 07/04/2013 2245   ALBUMIN 3.3* 04/01/2013 1134   AST 20 07/04/2013 2245   AST 19 04/01/2013 1134   AST 30 03/14/2011 0954   ALT 9 07/04/2013 2245   ALT 10 04/01/2013 1134   ALT 22 03/14/2011 0954   ALKPHOS 68 07/04/2013 2245   ALKPHOS 117 04/01/2013 1134   ALKPHOS 101* 03/14/2011 0954   BILITOT 0.4 07/04/2013 2245   BILITOT 0.27 04/01/2013 1134   BILITOT 0.50 03/14/2011 0954   GFRNONAA 86* 07/16/2013 0415   GFRAA >90 07/16/2013 0415   Lipase     Component Value Date/Time   LIPASE 150* 07/04/2013 2245       Studies/Results: Ct Abdomen Pelvis W Contrast  07/15/2013   CLINICAL DATA:  68 year old female status post jejunal ulcer repair and drain removal. Increasing leukocytosis. Initial encounter.  EXAM: CT ABDOMEN AND PELVIS WITH CONTRAST  TECHNIQUE: Multidetector CT imaging of the abdomen and pelvis was performed using the standard protocol following bolus administration  of intravenous contrast.  CONTRAST:  153mL OMNIPAQUE IOHEXOL 300 MG/ML  SOLN  COMPARISON:  Preoperative CT Abdomen and Pelvis 07/05/2013.  FINDINGS: New moderate volume layering left pleural effusion. Combined compressive atelectasis and more confluent left lower lobe pulmonary opacity adjacent to the effusion. No pericardial effusion. Trace layering right pleural effusion.  Ventral abdominal wound healing by secondary intention. New percutaneous gastrostomy tube in place. Small volume pneumoperitoneum.  Subdiaphragmatic perisplenic fluid (series 2, image 14) appears to be organizing and rim enhancing on the coronal images (series 5, image 103). This is about 7 cm diameter and 2 cm in thickness. Multilobulated rim enhancing abscess partially within the lesser sac located near the area of bowel perforation on the preoperative study (series 2,  image 33). The most confluent portion of this abscess encompasses 7.2 x 3.8 cm.  Oral contrast within the bowel which has reached the rectum. No bowel obstruction. Small volume of mesenteric gas in the left abdomen (series 2, image 46). The cecum is distended. Redundant right colon. No focally inflamed bowel is identified.  Negative liver and gallbladder. Stable calcified granulomas in the spleen. Pancreatic parenchyma within normal limits. Adrenal glands within normal limits. Portal venous system is patent. Aortoiliac calcified atherosclerosis noted. Major arterial structures in the abdomen and pelvis are patent. Mildly distended bladder. Calcified fibroid uterus.  No acute osseous abnormality identified.  IMPRESSION: 1. Left upper quadrant subphrenic and small bowel mesenteric abscesses, including tracking into the lesser sac. 2. Moderate layering left pleural effusion with left lower lobe atelectasis versus developing pneumonia. 3. No bowel obstruction status post proximal jejunal repair. No extravasated oral contrast. Trace residual pneumoperitoneum.   Electronically Signed   By: Lars Pinks M.D.   On: 07/15/2013 12:47   Dg Chest Port 1 View  07/15/2013   CLINICAL DATA:  Leukocytosis, cough, history throat and breast cancer  EXAM: PORTABLE CHEST - 1 VIEW  COMPARISON:  Portable exam 0807 hr compared to 07/11/2013  FINDINGS: Normal heart size, mediastinal contours and pulmonary vascularity.  Left lower lobe infiltrate question pneumonia.  Small left pleural effusion.  Remaining lungs clear.  No pneumothorax.  Right axillary surgical clips.  Bones demineralized.  IMPRESSION: Left lower lobe pneumonia with small left pleural effusion.   Electronically Signed   By: Lavonia Dana M.D.   On: 07/15/2013 08:19   Dg Abd Portable 1v  07/15/2013   CLINICAL DATA:  Leukocytosis after JP drain removal and change in antibiotics, history breast and throat cancer  EXAM: PORTABLE ABDOMEN - 1 VIEW  COMPARISON:  Portable exam 0810  hr compared to 07/04/2013  FINDINGS: Gastrostomy tube left upper quadrant.  Retained contrast in colon.  No bowel wall thickening or evidence of obstruction.  Degenerative changes of bilateral hip joints, severe on right with femoral head remodeling and superolateral subluxation.  IMPRESSION: Nonspecific bowel gas pattern.   Electronically Signed   By: Lavonia Dana M.D.   On: 07/15/2013 08:21    Anti-infectives: Anti-infectives   Start     Dose/Rate Route Frequency Ordered Stop   07/15/13 1400  ertapenem (INVANZ) 1 g in sodium chloride 0.9 % 50 mL IVPB     1 g 100 mL/hr over 30 Minutes Intravenous Every 24 hours 07/15/13 1311     07/14/13 1045  amoxicillin-clavulanate (AUGMENTIN) 875-125 MG per tablet 1 tablet  Status:  Discontinued     1 tablet Oral Every 12 hours 07/14/13 1033 07/15/13 1311   07/07/13 0815  fluconazole (DIFLUCAN) IVPB 200 mg  200 mg 100 mL/hr over 60 Minutes Intravenous Every 24 hours 07/06/13 1030     07/05/13 0815  fluconazole (DIFLUCAN) IVPB 400 mg  Status:  Discontinued     400 mg 100 mL/hr over 120 Minutes Intravenous Every 24 hours 07/05/13 0800 07/06/13 1030   07/05/13 0800  piperacillin-tazobactam (ZOSYN) IVPB 3.375 g  Status:  Discontinued     3.375 g 12.5 mL/hr over 240 Minutes Intravenous 3 times per day 07/05/13 0742 07/14/13 1033   07/05/13 0230  piperacillin-tazobactam (ZOSYN) IVPB 3.375 g     3.375 g 100 mL/hr over 30 Minutes Intravenous  Once 07/05/13 0229 07/05/13 0344   07/05/13 0230  metroNIDAZOLE (FLAGYL) IVPB 500 mg     500 mg 100 mL/hr over 60 Minutes Intravenous  Once 07/05/13 0229 07/05/13 0444       Assessment/Plan Perforated jejunal ulcer   POD #11 s/p Exploratory laparotomy, insertion of gastric tube, repair of jejunal ulcer perforation(Dr. Donne Hazel 07/05/13)  Leukocytosis - down to 17.3 today after starting invanz Thrombocytosis - 942 trending up likely due to infection- may need to start aspirin (after IR  procedure?) Cough Atelectasis vs developing pneumonia Respiratory failure - s/p laryngeal cancer, chemoradiation - resolved PCM - Has g-tube, NPO for IR procedure Hypokalemia - supplementation given K+ 2.5 Afib with RVR-rate controlled. D/c tele  ABL anemia-h&h are stable  Hypothyroidism-home meds  Hx ETOH abuse-MVI    Plan:  1. NPO for perc drain, D5WNS secondary to hypoglycemia 2. WBC down to 17.3. Repeat CT showed 2 abscess collections, Zosyn D#9/9, Augmentin D#2/2, Colbert Ewing D#2/? 3. BID wet to dry dressing changes. - wound with slough, but does not look infected, mucousy foul smelling drainage from G tube site - may be source of infection?  4. Mobilize, PT/OT  5. OR JP drain out due to low output continue G tube until she follow up with Dr. Donne Hazel in the office as an OP given her h/o of laryngeal cancer and chemoradiation; may want to leave in long term  6. SCD's and heparin SQ  7.  Supportive meds for tongue irritation, cough, airway Dispo - Pending IR Perc drain abscess fluid collections today, repeat labs tomorrow     LOS: 12 days    DORT, Kady Toothaker 07/16/2013, 7:40 AM Pager: [phone]

## 2013-07-16 NOTE — Progress Notes (Signed)
CRITICAL VALUE ALERT  Critical value received: platelet count 942  Date of notification: 07/16/13  Time of notification:0630  Critical value read back:yes  Nurse who received alert: L. Dionicia Abler  MD notified (1st page):  Dr Rosendo Gros  Time of first page: 0630  MD notified (2nd page):  Time of second page:  Responding MD:  Dr Rosendo Gros  Time MD [id number]

## 2013-07-16 NOTE — Procedures (Signed)
12 Fr abd abscess drain Frank pus No comp

## 2013-07-16 NOTE — Progress Notes (Signed)
Perc drain today.  Doing ok otherwise.

## 2013-07-16 NOTE — Progress Notes (Signed)
RT assessment done at this time. Patient stated that she did not wish to be woken up in the middle of the night for treatments. Requeted that we not come in. Changed to Duoneb TID and q4 PRN

## 2013-07-16 NOTE — ED Notes (Signed)
Pt alert throughout procedure, little sedation noted from Versed 2.5 mg and Fentanyl 75mcg IV.

## 2013-07-16 NOTE — ED Notes (Signed)
O2 2L/[place] started 

## 2013-07-16 NOTE — ED Notes (Signed)
O2 d/c'd 

## 2013-07-17 DIAGNOSIS — E876 Hypokalemia: Secondary | ICD-10-CM

## 2013-07-17 LAB — BASIC METABOLIC PANEL
BUN: 8 mg/dL (ref 6–23)
CO2: 19 meq/L (ref 19–32)
CREATININE: 0.62 mg/dL (ref 0.50–1.10)
Calcium: 6.8 mg/dL — ABNORMAL LOW (ref 8.4–10.5)
Chloride: 100 mEq/L (ref 96–112)
GFR calc Af Amer: >90 mL/min (ref >90)
GFR calc non Af Amer: >90 mL/min (ref >90)
GLUCOSE: 107 mg/dL — AB (ref 70–99)
Potassium: 3.3 mEq/L — ABNORMAL LOW (ref 3.7–5.3)
SODIUM: 135 meq/L — AB (ref 137–147)

## 2013-07-17 LAB — GLUCOSE, CAPILLARY
GLUCOSE-CAPILLARY: 111 mg/dL — AB (ref 70–99)
GLUCOSE-CAPILLARY: 96 mg/dL (ref 70–99)
GLUCOSE-CAPILLARY: 99 mg/dL (ref 70–99)
GLUCOSE-CAPILLARY: 93 mg/dL (ref 70–99)
Glucose-Capillary: 128 mg/dL — ABNORMAL HIGH (ref 70–99)
Glucose-Capillary: 75 mg/dL (ref 70–99)

## 2013-07-17 LAB — CBC
HCT: 24.7 % — ABNORMAL LOW (ref 36.0–46.0)
Hemoglobin: 8.8 g/dL — ABNORMAL LOW (ref 12.0–15.0)
MCH: 28.9 pg (ref 26.0–34.0)
MCHC: 35.6 g/dL (ref 30.0–36.0)
MCV: 81 fL (ref 78.0–100.0)
Platelets: 1102 10*3/uL (ref 150–400)
RBC: 3.05 MIL/uL — ABNORMAL LOW (ref 3.87–5.11)
RDW: 16 % — ABNORMAL HIGH (ref 11.5–15.5)
WBC: 14.8 10*3/uL — ABNORMAL HIGH (ref 4.0–10.5)

## 2013-07-17 MED ORDER — POTASSIUM CHLORIDE 20 MEQ/15ML (10%) PO LIQD
40.0000 meq | Freq: Every day | ORAL | Status: DC
  Administered 2013-07-18 – 2013-07-27 (×10): 40 meq via ORAL
  Filled 2013-07-17 (×11): qty 30

## 2013-07-17 MED ORDER — POTASSIUM CHLORIDE CRYS ER 20 MEQ PO TBCR
40.0000 meq | EXTENDED_RELEASE_TABLET | Freq: Every day | ORAL | Status: DC
  Administered 2013-07-17: 40 meq via ORAL
  Filled 2013-07-17: qty 2

## 2013-07-17 MED ORDER — PANTOPRAZOLE SODIUM 40 MG IV SOLR: 40.0000 mg | Freq: Two times a day (BID) | INTRAVENOUS | Status: DC

## 2013-07-17 MED ORDER — ASPIRIN 325 MG PO TABS
325.0000 mg | ORAL_TABLET | Freq: Every day | ORAL | Status: DC
  Administered 2013-07-17 – 2013-07-27 (×11): 325 mg via ORAL
  Filled 2013-07-17 (×11): qty 1

## 2013-07-17 MED ORDER — PANTOPRAZOLE SODIUM 40 MG PO TBEC
40.0000 mg | DELAYED_RELEASE_TABLET | Freq: Two times a day (BID) | ORAL | Status: DC
  Administered 2013-07-17 – 2013-07-18 (×2): 40 mg via ORAL
  Filled 2013-07-17 (×3): qty 1

## 2013-07-17 MED ORDER — LORAZEPAM 0.5 MG PO TABS
0.5000 mg | ORAL_TABLET | Freq: Once | ORAL | Status: AC
  Administered 2013-07-17: 0.5 mg via ORAL
  Filled 2013-07-17: qty 1

## 2013-07-17 MED ORDER — FLUCONAZOLE 200 MG PO TABS
200.0000 mg | ORAL_TABLET | Freq: Every day | ORAL | Status: DC
  Administered 2013-07-18 – 2013-07-27 (×10): 200 mg via ORAL
  Filled 2013-07-17 (×10): qty 1

## 2013-07-17 NOTE — Progress Notes (Signed)
12 Days Post-Op  Subjective: Pelvic drain placed in IR 1/15 Pt feels some better today   Objective: Vital signs in last 24 hours: Temp:  [97.9 F (36.6 C)-98.6 F (37 C)] 98.6 F (37 C) (01/16 0455) Pulse Rate:  [93-99] 96 (01/16 0455) Resp:  [16-22] 18 (01/16 0455) BP: (81-116)/(52-73) 116/62 mmHg (01/16 0455) SpO2:  [91 %-98 %] 94 % (01/16 0455) Last BM Date: 07/16/13  Intake/Output from previous day: 01/15 0701 - 01/16 0700 In: 2950 [P.O.:525; I.V.:2325; IV Piggyback:100] Out: 985 [Urine:950; Drains:35] Intake/Output this shift:    PE:  Afeb; vss Drain intact Site clean and dry; NT No bleeding Output bloody and pus material Wbc 14.8 (17.3)   Lab Results:   Recent Labs  07/16/13 0415 07/17/13 0320  WBC 17.3* 14.8*  HGB 8.5* 8.8*  HCT 24.0* 24.7*  PLT 942* 1102*   BMET  Recent Labs  07/16/13 0415 07/16/13 1359 07/17/13 0320  NA 128*  --  135*  K 2.5* 3.2* 3.3*  CL 92*  --  100  CO2 21  --  19  GLUCOSE 131*  --  107*  BUN 9  --  8  CREATININE 0.73  --  0.62  CALCIUM 6.6*  --  6.8*   PT/INR  Recent Labs  07/16/13 0415  LABPROT 15.0  INR 1.21   ABG No results found for this basename: PHART, PCO2, PO2, HCO3,  in the last 72 hours  Studies/Results: Ct Abdomen Pelvis W Contrast  07/15/2013   CLINICAL DATA:  68 year old female status post jejunal ulcer repair and drain removal. Increasing leukocytosis. Initial encounter.  EXAM: CT ABDOMEN AND PELVIS WITH CONTRAST  TECHNIQUE: Multidetector CT imaging of the abdomen and pelvis was performed using the standard protocol following bolus administration of intravenous contrast.  CONTRAST:  130mL OMNIPAQUE IOHEXOL 300 MG/ML  SOLN  COMPARISON:  Preoperative CT Abdomen and Pelvis 07/05/2013.  FINDINGS: New moderate volume layering left pleural effusion. Combined compressive atelectasis and more confluent left lower lobe pulmonary opacity adjacent to the effusion. No pericardial effusion. Trace layering right  pleural effusion.  Ventral abdominal wound healing by secondary intention. New percutaneous gastrostomy tube in place. Small volume pneumoperitoneum.  Subdiaphragmatic perisplenic fluid (series 2, image 14) appears to be organizing and rim enhancing on the coronal images (series 5, image 103). This is about 7 cm diameter and 2 cm in thickness. Multilobulated rim enhancing abscess partially within the lesser sac located near the area of bowel perforation on the preoperative study (series 2, image 33). The most confluent portion of this abscess encompasses 7.2 x 3.8 cm.  Oral contrast within the bowel which has reached the rectum. No bowel obstruction. Small volume of mesenteric gas in the left abdomen (series 2, image 46). The cecum is distended. Redundant right colon. No focally inflamed bowel is identified.  Negative liver and gallbladder. Stable calcified granulomas in the spleen. Pancreatic parenchyma within normal limits. Adrenal glands within normal limits. Portal venous system is patent. Aortoiliac calcified atherosclerosis noted. Major arterial structures in the abdomen and pelvis are patent. Mildly distended bladder. Calcified fibroid uterus.  No acute osseous abnormality identified.  IMPRESSION: 1. Left upper quadrant subphrenic and small bowel mesenteric abscesses, including tracking into the lesser sac. 2. Moderate layering left pleural effusion with left lower lobe atelectasis versus developing pneumonia. 3. No bowel obstruction status post proximal jejunal repair. No extravasated oral contrast. Trace residual pneumoperitoneum.   Electronically Signed   By: Lars Pinks M.D.   On:  07/15/2013 12:47    Anti-infectives: Anti-infectives   Start     Dose/Rate Route Frequency Ordered Stop   07/15/13 1400  ertapenem (INVANZ) 1 g in sodium chloride 0.9 % 50 mL IVPB     1 g 100 mL/hr over 30 Minutes Intravenous Every 24 hours 07/15/13 1311     07/14/13 1045  amoxicillin-clavulanate (AUGMENTIN) 875-125 MG  per tablet 1 tablet  Status:  Discontinued     1 tablet Oral Every 12 hours 07/14/13 1033 07/15/13 1311   07/07/13 0815  fluconazole (DIFLUCAN) IVPB 200 mg     200 mg 100 mL/hr over 60 Minutes Intravenous Every 24 hours 07/06/13 1030     07/05/13 0815  fluconazole (DIFLUCAN) IVPB 400 mg  Status:  Discontinued     400 mg 100 mL/hr over 120 Minutes Intravenous Every 24 hours 07/05/13 0800 07/06/13 1030   07/05/13 0800  piperacillin-tazobactam (ZOSYN) IVPB 3.375 g  Status:  Discontinued     3.375 g 12.5 mL/hr over 240 Minutes Intravenous 3 times per day 07/05/13 0742 07/14/13 1033   07/05/13 0230  piperacillin-tazobactam (ZOSYN) IVPB 3.375 g     3.375 g 100 mL/hr over 30 Minutes Intravenous  Once 07/05/13 0229 07/05/13 0344   07/05/13 0230  metroNIDAZOLE (FLAGYL) IVPB 500 mg     500 mg 100 mL/hr over 60 Minutes Intravenous  Once 07/05/13 0229 07/05/13 0444      Assessment/Plan: s/p Procedure(s): EXPLORATORY LAPAROTOMY, insertion of gastrostomy tube, repair jejunal ulcer perforation. (N/A)  Rt pelvic drain intact Will follow   LOS: 13 days    Dantae Meunier A 07/17/2013

## 2013-07-17 NOTE — Progress Notes (Signed)
Inpatient Diabetes Program Recommendations  AACE/ADA: New Consensus Statement on Inpatient Glycemic Control (2013)  Target Ranges:  Prepandial:   less than 140 mg/dL      Peak postprandial:   less than 180 mg/dL (1-2 hours)      Critically ill patients:  140 - 180 mg/dL  Results for Taylor [name], Taylor [name] (MRN [id number]) as of 07/17/2013 13:53  Ref. Range 07/16/2013 20:17 07/16/2013 20:57 07/16/2013 21:50 07/16/2013 23:38 07/17/2013 03:48 07/17/2013 07:46 07/17/2013 11:59  Glucose-Capillary Latest Range: 70-99 mg/dL 64 (L) 63 (L) 160 (H) 124 (H) 111 (H) 96 99   D/C Novolog.  Patient was hypoglycemic last evening. Thank you  Raoul Pitch BSN, RN,CDE Inpatient Diabetes Coordinator [phone] (team pager)

## 2013-07-17 NOTE — Progress Notes (Signed)
12 Days Post-Op  Subjective: Feels much better.  WBC improving, afebrile.  Tolerated clear liquids, wants regular food.  No N/V.  Urinating well and having BM's.    Objective: Vital signs in last 24 hours: Temp:  [97.9 F (36.6 C)-98.6 F (37 C)] 98.6 F (37 C) (01/16 0455) Pulse Rate:  [93-99] 96 (01/16 0455) Resp:  [16-22] 18 (01/16 0455) BP: (81-116)/(52-73) 116/62 mmHg (01/16 0455) SpO2:  [91 %-98 %] 94 % (01/16 0455) Last BM Date: 07/16/13  Intake/Output from previous day: 01/15 0701 - 01/16 0700 In: 2950 [P.O.:525; I.V.:2325; IV Piggyback:100] Out: 985 [Urine:950; Drains:35] Intake/Output this shift:    PE: Gen:  Alert, NAD, pleasant Card:  RRR, no M/G/R heard Pulm:  CTA, no W/R/R, better effort today IS at 1000 Abd: Soft, ND, minimal tenderness over open wound, but tolerated dressing change, +BS, no HSM, midline wound is more clean with minimal slough, some areas of good beefy red granulation tissue along the edges, G tube with less drainage today, skin is erythematous from drainage; new IR JP drain with sanguinous drainage with some purulent material in bulb    Lab Results:   Recent Labs  07/16/13 0415 07/17/13 0320  WBC 17.3* 14.8*  HGB 8.5* 8.8*  HCT 24.0* 24.7*  PLT 942* 1102*   BMET  Recent Labs  07/16/13 0415 07/16/13 1359 07/17/13 0320  NA 128*  --  135*  K 2.5* 3.2* 3.3*  CL 92*  --  100  CO2 21  --  19  GLUCOSE 131*  --  107*  BUN 9  --  8  CREATININE 0.73  --  0.62  CALCIUM 6.6*  --  6.8*   PT/INR  Recent Labs  07/16/13 0415  LABPROT 15.0  INR 1.21   CMP     Component Value Date/Time   NA 135* 07/17/2013 0320   NA 133* 04/01/2013 1134   NA 136 03/14/2011 0954   K 3.3* 07/17/2013 0320   K 4.2 04/01/2013 1134   K 5.5* 03/14/2011 0954   CL 100 07/17/2013 0320   CL 96* 10/08/2012 0911   CL 96* 03/14/2011 0954   CO2 19 07/17/2013 0320   CO2 27 04/01/2013 1134   CO2 26 03/14/2011 0954   GLUCOSE 107* 07/17/2013 0320   GLUCOSE 106 04/01/2013  1134   GLUCOSE 112* 10/08/2012 0911   GLUCOSE 113 03/14/2011 0954   BUN 8 07/17/2013 0320   BUN 9.9 04/01/2013 1134   BUN 11 03/14/2011 0954   CREATININE 0.62 07/17/2013 0320   CREATININE 0.7 04/01/2013 1134   CREATININE 0.5* 03/14/2011 0954   CALCIUM 6.8* 07/17/2013 0320   CALCIUM 9.5 04/01/2013 1134   CALCIUM 9.5 03/14/2011 0954   PROT 6.9 07/04/2013 2245   PROT 6.9 04/01/2013 1134   PROT 8.0 03/14/2011 0954   ALBUMIN 3.0* 07/04/2013 2245   ALBUMIN 3.3* 04/01/2013 1134   AST 20 07/04/2013 2245   AST 19 04/01/2013 1134   AST 30 03/14/2011 0954   ALT 9 07/04/2013 2245   ALT 10 04/01/2013 1134   ALT 22 03/14/2011 0954   ALKPHOS 68 07/04/2013 2245   ALKPHOS 117 04/01/2013 1134   ALKPHOS 101* 03/14/2011 0954   BILITOT 0.4 07/04/2013 2245   BILITOT 0.27 04/01/2013 1134   BILITOT 0.50 03/14/2011 0954   GFRNONAA >90 07/17/2013 0320   GFRAA >90 07/17/2013 0320   Lipase     Component Value Date/Time   LIPASE 150* 07/04/2013 2245  Studies/Results: Ct Abdomen Pelvis W Contrast  07/15/2013   CLINICAL DATA:  68 year old female status post jejunal ulcer repair and drain removal. Increasing leukocytosis. Initial encounter.  EXAM: CT ABDOMEN AND PELVIS WITH CONTRAST  TECHNIQUE: Multidetector CT imaging of the abdomen and pelvis was performed using the standard protocol following bolus administration of intravenous contrast.  CONTRAST:  126mL OMNIPAQUE IOHEXOL 300 MG/ML  SOLN  COMPARISON:  Preoperative CT Abdomen and Pelvis 07/05/2013.  FINDINGS: New moderate volume layering left pleural effusion. Combined compressive atelectasis and more confluent left lower lobe pulmonary opacity adjacent to the effusion. No pericardial effusion. Trace layering right pleural effusion.  Ventral abdominal wound healing by secondary intention. New percutaneous gastrostomy tube in place. Small volume pneumoperitoneum.  Subdiaphragmatic perisplenic fluid (series 2, image 14) appears to be organizing and rim enhancing on the coronal images  (series 5, image 103). This is about 7 cm diameter and 2 cm in thickness. Multilobulated rim enhancing abscess partially within the lesser sac located near the area of bowel perforation on the preoperative study (series 2, image 33). The most confluent portion of this abscess encompasses 7.2 x 3.8 cm.  Oral contrast within the bowel which has reached the rectum. No bowel obstruction. Small volume of mesenteric gas in the left abdomen (series 2, image 46). The cecum is distended. Redundant right colon. No focally inflamed bowel is identified.  Negative liver and gallbladder. Stable calcified granulomas in the spleen. Pancreatic parenchyma within normal limits. Adrenal glands within normal limits. Portal venous system is patent. Aortoiliac calcified atherosclerosis noted. Major arterial structures in the abdomen and pelvis are patent. Mildly distended bladder. Calcified fibroid uterus.  No acute osseous abnormality identified.  IMPRESSION: 1. Left upper quadrant subphrenic and small bowel mesenteric abscesses, including tracking into the lesser sac. 2. Moderate layering left pleural effusion with left lower lobe atelectasis versus developing pneumonia. 3. No bowel obstruction status post proximal jejunal repair. No extravasated oral contrast. Trace residual pneumoperitoneum.   Electronically Signed   By: Lars Pinks M.D.   On: 07/15/2013 12:47   Dg Chest Port 1 View  07/15/2013   CLINICAL DATA:  Leukocytosis, cough, history throat and breast cancer  EXAM: PORTABLE CHEST - 1 VIEW  COMPARISON:  Portable exam 0807 hr compared to 07/11/2013  FINDINGS: Normal heart size, mediastinal contours and pulmonary vascularity.  Left lower lobe infiltrate question pneumonia.  Small left pleural effusion.  Remaining lungs clear.  No pneumothorax.  Right axillary surgical clips.  Bones demineralized.  IMPRESSION: Left lower lobe pneumonia with small left pleural effusion.   Electronically Signed   By: Lavonia Dana M.D.   On:  07/15/2013 08:19   Dg Abd Portable 1v  07/15/2013   CLINICAL DATA:  Leukocytosis after JP drain removal and change in antibiotics, history breast and throat cancer  EXAM: PORTABLE ABDOMEN - 1 VIEW  COMPARISON:  Portable exam 0810 hr compared to 07/04/2013  FINDINGS: Gastrostomy tube left upper quadrant.  Retained contrast in colon.  No bowel wall thickening or evidence of obstruction.  Degenerative changes of bilateral hip joints, severe on right with femoral head remodeling and superolateral subluxation.  IMPRESSION: Nonspecific bowel gas pattern.   Electronically Signed   By: Lavonia Dana M.D.   On: 07/15/2013 08:21    Anti-infectives: Anti-infectives   Start     Dose/Rate Route Frequency Ordered Stop   07/15/13 1400  ertapenem (INVANZ) 1 g in sodium chloride 0.9 % 50 mL IVPB     1 g 100  mL/hr over 30 Minutes Intravenous Every 24 hours 07/15/13 1311     07/14/13 1045  amoxicillin-clavulanate (AUGMENTIN) 875-125 MG per tablet 1 tablet  Status:  Discontinued     1 tablet Oral Every 12 hours 07/14/13 1033 07/15/13 1311   07/07/13 0815  fluconazole (DIFLUCAN) IVPB 200 mg     200 mg 100 mL/hr over 60 Minutes Intravenous Every 24 hours 07/06/13 1030     07/05/13 0815  fluconazole (DIFLUCAN) IVPB 400 mg  Status:  Discontinued     400 mg 100 mL/hr over 120 Minutes Intravenous Every 24 hours 07/05/13 0800 07/06/13 1030   07/05/13 0800  piperacillin-tazobactam (ZOSYN) IVPB 3.375 g  Status:  Discontinued     3.375 g 12.5 mL/hr over 240 Minutes Intravenous 3 times per day 07/05/13 0742 07/14/13 1033   07/05/13 0230  piperacillin-tazobactam (ZOSYN) IVPB 3.375 g     3.375 g 100 mL/hr over 30 Minutes Intravenous  Once 07/05/13 0229 07/05/13 0344   07/05/13 0230  metroNIDAZOLE (FLAGYL) IVPB 500 mg     500 mg 100 mL/hr over 60 Minutes Intravenous  Once 07/05/13 0229 07/05/13 0444       Assessment/Plan Perforated jejunal ulcer  POD #12 s/p Exploratory laparotomy, insertion of gastric tube, repair  of jejunal ulcer perforation(Dr. Donne Hazel 07/05/13)  POD #1 s/p perc drain Leukocytosis - down to 14.8 today after starting invanz  Thrombocytosis - 1102  trending up likely due to infection- start 325mg  QD aspirin Cough  Atelectasis vs developing pneumonia  Respiratory failure - s/p laryngeal cancer, chemoradiation - resolved  PCM - Has g-tube, NPO for IR procedure  Hypokalemia - supplementation given K+ 3.3  Afib with RVR-rate controlled. D/c tele  ABL anemia-h&h are stable  Hypothyroidism-home meds  Hx ETOH abuse-MVI   Plan:  1. Advance to d3 diet 2. WBC down to 14.8. Repeat CT showed 2 abscess collections, Zosyn D#9/9, Augmentin D#2/2, Colbert Ewing D#3/?  3. BID wet to dry dressing changes. - wound improved and less G-tube site drainage 4. Mobilize, PT/OT  5. OR JP drain out due to low output continue G tube until she follow up with Dr. Donne Hazel in the office as an OP given her h/o of laryngeal cancer and chemoradiation; may want to leave in long term.  Continue IR drain - not much drainage yet. 6. SCD's and heparin SQ  7. Supportive meds for tongue irritation, cough, airway  Dispo - Home when abscess collection better, repeat labs tomorrow     LOS: 13 days    DORT, Tyreonna Czaplicki 07/17/2013, 7:37 AM Pager: [phone]

## 2013-07-17 NOTE — Progress Notes (Signed)
Physical Therapy Treatment Patient Details Name: Taylor [name] MRN: [id number] DOB: 07/10/1945 Today's Date: 07/17/2013 Time: 6962-9528 PT Time Calculation (min): 25 min  PT Assessment / Plan / Recommendation  History of Present Illness 68 yo admitted 1/3 with perforated jejunum requiring emergent exploratory laparotomy with gastrostomy tube placed.  Pt intubated 1/3- 07/09/13.  Pt with h/o ETOH abuse and tobacco use   PT Comments   Patient with increased weakness and fatigue this session but continues to be motivated to ambulation. Partner was inquiring about ambulance transport when patient is ready to DC. I agree that this is the best plan as patient has 32 steps to enter condo. Will continue to follow.   Follow Up Recommendations  Home health PT;Supervision for mobility/OOB     Does the patient have the potential to tolerate intense rehabilitation     Barriers to Discharge        Equipment Recommendations  Rolling walker with 5" wheels    Recommendations for Other Services    Frequency Min 3X/week   Progress towards PT Goals Progress towards PT goals: Progressing toward goals  Plan Current plan remains appropriate    Precautions / Restrictions Precautions Precautions: Fall Precaution Comments: multiple lines including abd JP drain & gastrostomy   Pertinent Vitals/Pain  no apparent distress        Mobility  Transfers Overall transfer level: Needs assistance Equipment used: Rolling walker (2 wheeled) Sit to Stand: Min guard Stand pivot transfers: Min guard General transfer comment: Cues for safe hand placement Ambulation/Gait Ambulation/Gait assistance: Min guard;Min assist Ambulation Distance (Feet): 200 Feet Assistive device: Rolling walker (2 wheeled) Gait Pattern/deviations: Step-through pattern Gait velocity: decreased General Gait Details: Patient required sitting rest break this session. Noted fatigue and weakness this session. Quick to step with  decreased safety and postioning of RW    Exercises     PT Diagnosis:    PT Problem List:   PT Treatment Interventions:     PT Goals (current goals can now be found in the care plan section)    Visit Information  Last PT Received On: 07/17/13 Assistance Needed: +1 History of Present Illness: 68 yo admitted 1/3 with perforated jejunum requiring emergent exploratory laparotomy with gastrostomy tube placed.  Pt intubated 1/3- 07/09/13.  Pt with h/o ETOH abuse and tobacco use    Subjective Data      Cognition  Cognition Arousal/Alertness: Awake/alert Behavior During Therapy: WFL for tasks assessed/performed Overall Cognitive Status: Within Functional Limits for tasks assessed    Balance     End of Session PT - End of Session Equipment Utilized During Treatment: Gait belt Activity Tolerance: Patient tolerated treatment well Patient left: in chair;with call bell/phone within reach;with family/visitor present Nurse Communication: Mobility status   GP     Taylor [name] 07/17/2013, 2:38 PM 07/17/2013 Taylor [name] PTA [phone] pager [phone] office

## 2013-07-17 NOTE — Consult Note (Signed)
WOC wound consult note Reason for Consult: skin denudation around leaking G tube.  Pt s/p Exploratory laparotomy, primary repair of perforated jejunal interval with omental patch, gastrostomy tube placement on 07/05/13  She has had leakage of gastric contents around the Gtube for several days.  The drainage is yellow-green.  Tube not being used currently for tube feeding.  Nursing changing spilt gauze 3-4x per shift for drainage control.  Per surgery notes less drainage today after IR has placed JP's for drainage of abscess collections.  Her skin does have some denudation circumferentially, partial thickness skin loss over a portion of the area, very red and inflamed.  At the time of my assessment today when I palpate around the tube I do not express any drainage.  The site is tender and a bit painful when I cleaned and dry it.   Applied zinc/calamine based barrier cream, thick layer around the tube, right up to the entrance site to protect the affected skin from the output/stomach enzymes that area certainly affected this skin.  Should the drainage increase and be unmanageable with the use of barrier cream and gauze we can try a pouch around the tube but this makes use of the tube more difficult some times. Will re-eval skin next week to monitor for improvement.  Samples in the room for the nursing staff to use as well as product information given to the caregiver in the room.   WOC will follow along for improvements in the skin.  Discussed POC with patient and bedside nurse.  Thanks  Taylor [name], [place] [phone])

## 2013-07-17 NOTE — Progress Notes (Signed)
GT an issue. Drainage more so last night but better today. If it continues to be an issue,  It may need to be removed.

## 2013-07-18 LAB — GLUCOSE, CAPILLARY
GLUCOSE-CAPILLARY: 87 mg/dL (ref 70–99)
GLUCOSE-CAPILLARY: 92 mg/dL (ref 70–99)
GLUCOSE-CAPILLARY: 88 mg/dL (ref 70–99)
Glucose-Capillary: 108 mg/dL — ABNORMAL HIGH (ref 70–99)
Glucose-Capillary: 94 mg/dL (ref 70–99)

## 2013-07-18 MED ORDER — LORAZEPAM 2 MG/ML IJ SOLN
0.5000 mg | Freq: Three times a day (TID) | INTRAMUSCULAR | Status: DC | PRN
  Administered 2013-07-18 – 2013-07-24 (×4): 0.5 mg via INTRAVENOUS
  Filled 2013-07-18 (×4): qty 1

## 2013-07-18 MED ORDER — MENTHOL-ZINC OXIDE 0.44-20.625 % EX OINT
1.0000 "application " | TOPICAL_OINTMENT | Freq: Two times a day (BID) | CUTANEOUS | Status: DC
  Administered 2013-07-20 – 2013-07-26 (×10): 1 via TOPICAL
  Filled 2013-07-18: qty 71

## 2013-07-18 MED ORDER — PANTOPRAZOLE SODIUM 40 MG PO PACK
40.0000 mg | PACK | Freq: Two times a day (BID) | ORAL | Status: DC
  Administered 2013-07-18 – 2013-07-27 (×17): 40 mg
  Filled 2013-07-18 (×23): qty 20

## 2013-07-18 MED ORDER — THIAMINE HCL 100 MG/ML IJ SOLN
100.0000 mg | Freq: Every day | INTRAMUSCULAR | Status: DC
  Administered 2013-07-19 – 2013-07-22 (×4): 100 mg via INTRAVENOUS
  Filled 2013-07-18 (×4): qty 1
  Filled 2013-07-18: qty 2

## 2013-07-18 MED ORDER — FOLIC ACID 5 MG/ML IJ SOLN
1.0000 mg | Freq: Every day | INTRAMUSCULAR | Status: DC
  Administered 2013-07-19 – 2013-07-22 (×4): 1 mg via INTRAVENOUS
  Filled 2013-07-18 (×4): qty 0.2

## 2013-07-18 MED ORDER — MENTHOL-ZINC OXIDE 0.44-20.625 % EX OINT
1.0000 "application " | TOPICAL_OINTMENT | Freq: Two times a day (BID) | CUTANEOUS | Status: DC
  Filled 2013-07-18: qty 71

## 2013-07-18 MED ORDER — NYSTATIN 100000 UNIT/ML MT SUSP
5.0000 mL | Freq: Four times a day (QID) | OROMUCOSAL | Status: DC
  Administered 2013-07-18 – 2013-07-26 (×29): 500000 [IU] via ORAL
  Filled 2013-07-18 (×37): qty 5

## 2013-07-18 NOTE — Progress Notes (Signed)
Pink restricted arm band placed on the right arm due to pt stating she had multiple lymph nodes removed on the right side with her lumpectomy.

## 2013-07-18 NOTE — Progress Notes (Signed)
13 Days Post-Op  Subjective: Complains of drainage around g tube but it seems less today. She says she has had a couple small bm's  Objective: Vital signs in last 24 hours: Temp:  [98.2 F (36.8 C)-98.7 F (37.1 C)] 98.6 F (37 C) (01/17 0530) Pulse Rate:  [93-98] 93 (01/17 0530) Resp:  [19-20] 20 (01/17 0530) BP: (111-129)/(53-60) 117/53 mmHg (01/17 0530) SpO2:  [91 %-94 %] 91 % (01/17 0530) Last BM Date: 07/17/13  Intake/Output from previous day: 01/16 0701 - 01/17 0700 In: 1150 [P.O.:315; I.V.:835] Out: 20 [Drains:20] Intake/Output this shift: Total I/O In: 100 [P.O.:100] Out: -   Resp: clear to auscultation bilaterally Cardio: regular rate and rhythm GI: soft but distended. good bs. g tube with small amount of drainage around it  Lab Results:   Recent Labs  07/16/13 0415 07/17/13 0320  WBC 17.3* 14.8*  HGB 8.5* 8.8*  HCT 24.0* 24.7*  PLT 942* 1102*   BMET  Recent Labs  07/16/13 0415 07/16/13 1359 07/17/13 0320  NA 128*  --  135*  K 2.5* 3.2* 3.3*  CL 92*  --  100  CO2 21  --  19  GLUCOSE 131*  --  107*  BUN 9  --  8  CREATININE 0.73  --  0.62  CALCIUM 6.6*  --  6.8*   PT/INR  Recent Labs  07/16/13 0415  LABPROT 15.0  INR 1.21   ABG No results found for this basename: PHART, PCO2, PO2, HCO3,  in the last 72 hours  Studies/Results: Ct Image Guided Fluid Drain By Catheter  07/17/2013   CLINICAL DATA:  Left abdominal abscess  EXAM: CT IMAGE GUIDED FLUID DRAIN BY CATHETER  MEDICATIONS AND MEDICAL HISTORY: Versed 2.5 mg, Fentanyl 75 mcg.  Additional Medications: None.  ANESTHESIA/SEDATION: Moderate sedation time: 12 minutes  CONTRAST:  None  FLUOROSCOPY TIME:  None minutes  PROCEDURE: The procedure, risks, benefits, and alternatives were explained to the patient. Questions regarding the procedure were encouraged and answered. The patient understands and consents to the procedure.  The left upper quad was prepped with Betadine in a sterile fashion,  and a sterile drape was applied covering the operative field. A sterile gown and sterile gloves were used for the procedure.  1% lidocaine was utilized for local anesthesia.  Under CT guidance, an 18 gauge needle was inserted into the abdominal abscess. It was removed over an Amplatz. A 12 French dilator followed by a 12 [place] drain was inserted into the fluid collection. It was looped and string fixed then sewn the skin.  COMPLICATIONS: None  FINDINGS: Imaging documents 14 French drain placement into a left upper quadrant abscess.  IMPRESSION: Successful left upper quadrant abscess strain yielding pus.   Electronically Signed   By: Maryclare Bean M.D.   On: 07/17/2013 09:14    Anti-infectives: Anti-infectives   Start     Dose/Rate Route Frequency Ordered Stop   07/18/13 1000  fluconazole (DIFLUCAN) tablet 200 mg     200 mg Oral Daily 07/17/13 0940     07/15/13 1400  ertapenem (INVANZ) 1 g in sodium chloride 0.9 % 50 mL IVPB     1 g 100 mL/hr over 30 Minutes Intravenous Every 24 hours 07/15/13 1311     07/14/13 1045  amoxicillin-clavulanate (AUGMENTIN) 875-125 MG per tablet 1 tablet  Status:  Discontinued     1 tablet Oral Every 12 hours 07/14/13 1033 07/15/13 1311   07/07/13 0815  fluconazole (DIFLUCAN) IVPB 200 mg  Status:  Discontinued     200 mg 100 mL/hr over 60 Minutes Intravenous Every 24 hours 07/06/13 1030 07/17/13 0940   07/05/13 0815  fluconazole (DIFLUCAN) IVPB 400 mg  Status:  Discontinued     400 mg 100 mL/hr over 120 Minutes Intravenous Every 24 hours 07/05/13 0800 07/06/13 1030   07/05/13 0800  piperacillin-tazobactam (ZOSYN) IVPB 3.375 g  Status:  Discontinued     3.375 g 12.5 mL/hr over 240 Minutes Intravenous 3 times per day 07/05/13 0742 07/14/13 1033   07/05/13 0230  piperacillin-tazobactam (ZOSYN) IVPB 3.375 g     3.375 g 100 mL/hr over 30 Minutes Intravenous  Once 07/05/13 0229 07/05/13 0344   07/05/13 0230  metroNIDAZOLE (FLAGYL) IVPB 500 mg     500 mg 100 mL/hr over 60  Minutes Intravenous  Once 07/05/13 0229 07/05/13 0444      Assessment/Plan: s/p Procedure(s): EXPLORATORY LAPAROTOMY, insertion of gastrostomy tube, repair jejunal ulcer perforation. (N/A) continue liquid supplements for nutrition support Continue abx and drain May need tpn if she can't get enough nutrition Frequent dressing changes around gtube  LOS: 14 days    TOTH III,PAUL S 07/18/2013

## 2013-07-18 NOTE — Progress Notes (Signed)
13 Days Post-Op  Subjective: Pt without new c/o  Objective: Vital signs in last 24 hours: Temp:  [98.2 F (36.8 C)-98.7 F (37.1 C)] 98.6 F (37 C) (01/17 0530) Pulse Rate:  [93-98] 93 (01/17 0530) Resp:  [19-20] 20 (01/17 0530) BP: (111-129)/(53-60) 117/53 mmHg (01/17 0530) SpO2:  [91 %-94 %] 91 % (01/17 0530) Last BM Date: 07/17/13  Intake/Output from previous day: 01/16 0701 - 01/17 0700 In: 1150 [P.O.:315; I.V.:835] Out: 20 [Drains:20] Intake/Output this shift:    LUQ drain intact, output 20 cc's serous fluid in bulb, cx's neg to date, insertion site ok, minimal tenderness  Lab Results:   Recent Labs  07/16/13 0415 07/17/13 0320  WBC 17.3* 14.8*  HGB 8.5* 8.8*  HCT 24.0* 24.7*  PLT 942* 1102*   BMET  Recent Labs  07/16/13 0415 07/16/13 1359 07/17/13 0320  NA 128*  --  135*  K 2.5* 3.2* 3.3*  CL 92*  --  100  CO2 21  --  19  GLUCOSE 131*  --  107*  BUN 9  --  8  CREATININE 0.73  --  0.62  CALCIUM 6.6*  --  6.8*   PT/INR  Recent Labs  07/16/13 0415  LABPROT 15.0  INR 1.21   ABG No results found for this basename: PHART, PCO2, PO2, HCO3,  in the last 72 hours  Studies/Results: Ct Image Guided Fluid Drain By Catheter  07/17/2013   CLINICAL DATA:  Left abdominal abscess  EXAM: CT IMAGE GUIDED FLUID DRAIN BY CATHETER  MEDICATIONS AND MEDICAL HISTORY: Versed 2.5 mg, Fentanyl 75 mcg.  Additional Medications: None.  ANESTHESIA/SEDATION: Moderate sedation time: 12 minutes  CONTRAST:  None  FLUOROSCOPY TIME:  None minutes  PROCEDURE: The procedure, risks, benefits, and alternatives were explained to the patient. Questions regarding the procedure were encouraged and answered. The patient understands and consents to the procedure.  The left upper quad was prepped with Betadine in a sterile fashion, and a sterile drape was applied covering the operative field. A sterile gown and sterile gloves were used for the procedure.  1% lidocaine was utilized for local  anesthesia.  Under CT guidance, an 18 gauge needle was inserted into the abdominal abscess. It was removed over an Amplatz. A 12 French dilator followed by a 12 [place] drain was inserted into the fluid collection. It was looped and string fixed then sewn the skin.  COMPLICATIONS: None  FINDINGS: Imaging documents 60 French drain placement into a left upper quadrant abscess.  IMPRESSION: Successful left upper quadrant abscess strain yielding pus.   Electronically Signed   By: Maryclare Bean M.D.   On: 07/17/2013 09:14    Anti-infectives: Anti-infectives   Start     Dose/Rate Route Frequency Ordered Stop   07/18/13 1000  fluconazole (DIFLUCAN) tablet 200 mg     200 mg Oral Daily 07/17/13 0940     07/15/13 1400  ertapenem (INVANZ) 1 g in sodium chloride 0.9 % 50 mL IVPB     1 g 100 mL/hr over 30 Minutes Intravenous Every 24 hours 07/15/13 1311     07/14/13 1045  amoxicillin-clavulanate (AUGMENTIN) 875-125 MG per tablet 1 tablet  Status:  Discontinued     1 tablet Oral Every 12 hours 07/14/13 1033 07/15/13 1311   07/07/13 0815  fluconazole (DIFLUCAN) IVPB 200 mg  Status:  Discontinued     200 mg 100 mL/hr over 60 Minutes Intravenous Every 24 hours 07/06/13 1030 07/17/13 0940   07/05/13 0815  fluconazole (DIFLUCAN) IVPB 400 mg  Status:  Discontinued     400 mg 100 mL/hr over 120 Minutes Intravenous Every 24 hours 07/05/13 0800 07/06/13 1030   07/05/13 0800  piperacillin-tazobactam (ZOSYN) IVPB 3.375 g  Status:  Discontinued     3.375 g 12.5 mL/hr over 240 Minutes Intravenous 3 times per day 07/05/13 0742 07/14/13 1033   07/05/13 0230  piperacillin-tazobactam (ZOSYN) IVPB 3.375 g     3.375 g 100 mL/hr over 30 Minutes Intravenous  Once 07/05/13 0229 07/05/13 0344   07/05/13 0230  metroNIDAZOLE (FLAGYL) IVPB 500 mg     500 mg 100 mL/hr over 60 Minutes Intravenous  Once 07/05/13 0229 07/05/13 0444      Assessment/Plan: s/p LUQ fluid collection drainage 1/15; check final cx's, cont drain irrigation,  monitor labs, check f/u CT next week; other plans as per [facility]   LOS: 14 days    Mayra Jolliffe,D [facility] 07/18/2013

## 2013-07-19 LAB — GLUCOSE, CAPILLARY
GLUCOSE-CAPILLARY: 97 mg/dL (ref 70–99)
GLUCOSE-CAPILLARY: 107 mg/dL — AB (ref 70–99)
Glucose-Capillary: 81 mg/dL (ref 70–99)
Glucose-Capillary: 92 mg/dL (ref 70–99)
Glucose-Capillary: 94 mg/dL (ref 70–99)
Glucose-Capillary: 95 mg/dL (ref 70–99)

## 2013-07-19 LAB — CREATININE, SERUM
CREATININE: 0.52 mg/dL (ref 0.50–1.10)
GFR calc non Af Amer: >90 mL/min (ref >90)

## 2013-07-19 NOTE — Progress Notes (Signed)
14 Days Post-Op  Subjective: No complaints today. Less drainage from around gtube  Objective: Vital signs in last 24 hours: Temp:  [97.6 F (36.4 C)-98.8 F (37.1 C)] 98.8 F (37.1 C) (01/18 0621) Pulse Rate:  [97-104] 104 (01/18 0621) Resp:  [16-19] 16 (01/18 0621) BP: (115-124)/(57-58) 124/58 mmHg (01/18 0621) SpO2:  [93 %-100 %] 93 % (01/18 0621) Last BM Date: 07/18/13  Intake/Output from previous day: 01/17 0701 - 01/18 0700 In: 948 [P.O.:250; I.V.:658] Out: 6712 [Urine:1551; Drains:20; Stool:3] Intake/Output this shift: Total I/O In: -  Out: 200 [Urine:200]  Resp: rhonchi bilaterally Cardio: regular rate and rhythm GI: soft, nontender. mild distension. 2 bm's  Lab Results:   Recent Labs  07/17/13 0320  WBC 14.8*  HGB 8.8*  HCT 24.7*  PLT 1102*   BMET  Recent Labs  07/16/13 1359 07/17/13 0320 07/19/13 0415  NA  --  135*  --   K 3.2* 3.3*  --   CL  --  100  --   CO2  --  19  --   GLUCOSE  --  107*  --   BUN  --  8  --   CREATININE  --  0.62 0.52  CALCIUM  --  6.8*  --    PT/INR No results found for this basename: LABPROT, INR,  in the last 72 hours ABG No results found for this basename: PHART, PCO2, PO2, HCO3,  in the last 72 hours  Studies/Results: No results found.  Anti-infectives: Anti-infectives   Start     Dose/Rate Route Frequency Ordered Stop   07/18/13 1000  fluconazole (DIFLUCAN) tablet 200 mg     200 mg Oral Daily 07/17/13 0940     07/15/13 1400  ertapenem (INVANZ) 1 g in sodium chloride 0.9 % 50 mL IVPB     1 g 100 mL/hr over 30 Minutes Intravenous Every 24 hours 07/15/13 1311     07/14/13 1045  amoxicillin-clavulanate (AUGMENTIN) 875-125 MG per tablet 1 tablet  Status:  Discontinued     1 tablet Oral Every 12 hours 07/14/13 1033 07/15/13 1311   07/07/13 0815  fluconazole (DIFLUCAN) IVPB 200 mg  Status:  Discontinued     200 mg 100 mL/hr over 60 Minutes Intravenous Every 24 hours 07/06/13 1030 07/17/13 0940   07/05/13 0815   fluconazole (DIFLUCAN) IVPB 400 mg  Status:  Discontinued     400 mg 100 mL/hr over 120 Minutes Intravenous Every 24 hours 07/05/13 0800 07/06/13 1030   07/05/13 0800  piperacillin-tazobactam (ZOSYN) IVPB 3.375 g  Status:  Discontinued     3.375 g 12.5 mL/hr over 240 Minutes Intravenous 3 times per day 07/05/13 0742 07/14/13 1033   07/05/13 0230  piperacillin-tazobactam (ZOSYN) IVPB 3.375 g     3.375 g 100 mL/hr over 30 Minutes Intravenous  Once 07/05/13 0229 07/05/13 0344   07/05/13 0230  metroNIDAZOLE (FLAGYL) IVPB 500 mg     500 mg 100 mL/hr over 60 Minutes Intravenous  Once 07/05/13 0229 07/05/13 0444      Assessment/Plan: s/p Procedure(s): EXPLORATORY LAPAROTOMY, insertion of gastrostomy tube, repair jejunal ulcer perforation. (N/A) will start calorie count today because I am concerned that she is not taking enough po to thrive at home OOB Continue invanz and diflucan  LOS: 15 days    TOTH III,PAUL S 07/19/2013

## 2013-07-20 LAB — GLUCOSE, CAPILLARY
GLUCOSE-CAPILLARY: 107 mg/dL — AB (ref 70–99)
GLUCOSE-CAPILLARY: 117 mg/dL — AB (ref 70–99)
GLUCOSE-CAPILLARY: 125 mg/dL — AB (ref 70–99)
Glucose-Capillary: 100 mg/dL — ABNORMAL HIGH (ref 70–99)
Glucose-Capillary: 71 mg/dL (ref 70–99)
Glucose-Capillary: 75 mg/dL (ref 70–99)
Glucose-Capillary: 94 mg/dL (ref 70–99)

## 2013-07-20 LAB — CULTURE, ROUTINE-ABSCESS

## 2013-07-20 MED ORDER — ENSURE COMPLETE PO LIQD
237.0000 mL | Freq: Three times a day (TID) | ORAL | Status: DC
  Administered 2013-07-20: 237 mL

## 2013-07-20 MED ORDER — ENSURE COMPLETE PO LIQD
237.0000 mL | Freq: Two times a day (BID) | ORAL | Status: DC
  Administered 2013-07-21: 237 mL

## 2013-07-20 MED ORDER — ADULT MULTIVITAMIN LIQUID CH
5.0000 mL | Freq: Every day | ORAL | Status: DC
  Administered 2013-07-20 – 2013-07-27 (×8): 5 mL via ORAL
  Filled 2013-07-20 (×10): qty 5

## 2013-07-20 MED ORDER — BOOST / RESOURCE BREEZE PO LIQD
1.0000 | Freq: Two times a day (BID) | ORAL | Status: DC
  Administered 2013-07-21 – 2013-07-24 (×7): 1 via ORAL

## 2013-07-20 NOTE — Progress Notes (Signed)
15 Days Post-Op  Subjective: Pelvic abscess drain placed 1/15 Minimal output x 2 days For CT 1/20 per chart  Objective: Vital signs in last 24 hours: Temp:  [98.4 F (36.9 C)-99 F (37.2 C)] 99 F (37.2 C) (01/19 0555) Pulse Rate:  [88-99] 99 (01/19 0555) Resp:  [16-18] 16 (01/19 0555) BP: (115-135)/(56-64) 131/64 mmHg (01/19 0555) SpO2:  [90 %-94 %] 92 % (01/19 0950) Weight:  [114 lb 12.9 oz (52.075 kg)-115 lb (52.164 kg)] 114 lb 12.9 oz (52.075 kg) (01/19 0555) Last BM Date: 07/18/13  Intake/Output from previous day: 01/18 0701 - 01/19 0700 In: 3388.8 [P.O.:260; I.V.:2878.8; IV Piggyback:250] Out: 707.5 [Urine:700; Drains:7.5] Intake/Output this shift:    PE:  Afeb; vss Output less than 10 cc x 2 days Site clean and dry; NT Pus like cx- no growth   Lab Results:  No results found for this basename: WBC, HGB, HCT, PLT,  in the last 72 hours BMET  Recent Labs  07/19/13 0415  CREATININE 0.52   PT/INR No results found for this basename: LABPROT, INR,  in the last 72 hours ABG No results found for this basename: PHART, PCO2, PO2, HCO3,  in the last 72 hours  Studies/Results: No results found.  Anti-infectives: Anti-infectives   Start     Dose/Rate Route Frequency Ordered Stop   07/18/13 1000  fluconazole (DIFLUCAN) tablet 200 mg     200 mg Oral Daily 07/17/13 0940     07/15/13 1400  ertapenem (INVANZ) 1 g in sodium chloride 0.9 % 50 mL IVPB     1 g 100 mL/hr over 30 Minutes Intravenous Every 24 hours 07/15/13 1311     07/14/13 1045  amoxicillin-clavulanate (AUGMENTIN) 875-125 MG per tablet 1 tablet  Status:  Discontinued     1 tablet Oral Every 12 hours 07/14/13 1033 07/15/13 1311   07/07/13 0815  fluconazole (DIFLUCAN) IVPB 200 mg  Status:  Discontinued     200 mg 100 mL/hr over 60 Minutes Intravenous Every 24 hours 07/06/13 1030 07/17/13 0940   07/05/13 0815  fluconazole (DIFLUCAN) IVPB 400 mg  Status:  Discontinued     400 mg 100 mL/hr over 120 Minutes  Intravenous Every 24 hours 07/05/13 0800 07/06/13 1030   07/05/13 0800  piperacillin-tazobactam (ZOSYN) IVPB 3.375 g  Status:  Discontinued     3.375 g 12.5 mL/hr over 240 Minutes Intravenous 3 times per day 07/05/13 0742 07/14/13 1033   07/05/13 0230  piperacillin-tazobactam (ZOSYN) IVPB 3.375 g     3.375 g 100 mL/hr over 30 Minutes Intravenous  Once 07/05/13 0229 07/05/13 0344   07/05/13 0230  metroNIDAZOLE (FLAGYL) IVPB 500 mg     500 mg 100 mL/hr over 60 Minutes Intravenous  Once 07/05/13 0229 07/05/13 0444      Assessment/Plan: s/p Procedure(s): EXPLORATORY LAPAROTOMY, insertion of gastrostomy tube, repair jejunal ulcer perforation. (N/A)  Abscess drain intact Minimal output CT for 1/20 per chart   LOS: 16 days    Orry Sigl A 07/20/2013

## 2013-07-20 NOTE — Progress Notes (Signed)
I have seen and examined the patient and agree with the assessment and plans. Will repeat CT scan tomorrow  Taylor [name] A. Ninfa Linden  MD, FACS

## 2013-07-20 NOTE — Progress Notes (Signed)
PT Cancellation Note  Patient Details Name: Taylor [name] MRN: [id number] DOB: 1946-03-23   Cancelled Treatment:    Reason Eval/Treat Not Completed: Fatigue/lethargy limiting ability to participate. Held PT today for patient to work with OT. PT will work with patient tomorrow.    Jacqualyn Posey 07/20/2013, 2:30 PM

## 2013-07-20 NOTE — Progress Notes (Signed)
15 Days Post-Op  Subjective: Pt wants to go home.  No sob, cp, palpitations.  Still has a clear productive cough.  Afebrile.  +flatus, no dysuria.   Objective: Vital signs in last 24 hours: Temp:  [98.4 F (36.9 C)-99 F (37.2 C)] 99 F (37.2 C) (01/19 0555) Pulse Rate:  [88-99] 99 (01/19 0555) Resp:  [16-18] 16 (01/19 0555) BP: (115-135)/(56-64) 131/64 mmHg (01/19 0555) SpO2:  [90 %-94 %] 93 % (01/19 0555) Weight:  [114 lb 12.9 oz (52.075 kg)-115 lb (52.164 kg)] 114 lb 12.9 oz (52.075 kg) (01/19 0555) Last BM Date: 07/18/13  Intake/Output from previous day: 01/18 0701 - 01/19 0700 In: 3388.8 [P.O.:260; I.V.:2878.8; IV Piggyback:250] Out: 707.5 [Urine:700; Drains:7.5] Intake/Output this shift:    General appearance: alert, cooperative and no distress Resp: clear to auscultation bilaterally Cardio: irregularly irregular, no murmurs gallops or rubs GI: +bs, abdomen is soft appropriately tender.  midline incison is c/d/i.  g tube  clamped.  drain with purulent output.   Lab Results:  No results found for this basename: WBC, HGB, HCT, PLT,  in the last 72 hours BMET  Recent Labs  07/19/13 0415  CREATININE 0.52   PT/INR No results found for this basename: LABPROT, INR,  in the last 72 hours ABG No results found for this basename: PHART, PCO2, PO2, HCO3,  in the last 72 hours  Studies/Results: No results found.  Anti-infectives: Anti-infectives   Start     Dose/Rate Route Frequency Ordered Stop   07/18/13 1000  fluconazole (DIFLUCAN) tablet 200 mg     200 mg Oral Daily 07/17/13 0940     07/15/13 1400  ertapenem (INVANZ) 1 g in sodium chloride 0.9 % 50 mL IVPB     1 g 100 mL/hr over 30 Minutes Intravenous Every 24 hours 07/15/13 1311     07/14/13 1045  amoxicillin-clavulanate (AUGMENTIN) 875-125 MG per tablet 1 tablet  Status:  Discontinued     1 tablet Oral Every 12 hours 07/14/13 1033 07/15/13 1311   07/07/13 0815  fluconazole (DIFLUCAN) IVPB 200 mg  Status:   Discontinued     200 mg 100 mL/hr over 60 Minutes Intravenous Every 24 hours 07/06/13 1030 07/17/13 0940   07/05/13 0815  fluconazole (DIFLUCAN) IVPB 400 mg  Status:  Discontinued     400 mg 100 mL/hr over 120 Minutes Intravenous Every 24 hours 07/05/13 0800 07/06/13 1030   07/05/13 0800  piperacillin-tazobactam (ZOSYN) IVPB 3.375 g  Status:  Discontinued     3.375 g 12.5 mL/hr over 240 Minutes Intravenous 3 times per day 07/05/13 0742 07/14/13 1033   07/05/13 0230  piperacillin-tazobactam (ZOSYN) IVPB 3.375 g     3.375 g 100 mL/hr over 30 Minutes Intravenous  Once 07/05/13 0229 07/05/13 0344   07/05/13 0230  metroNIDAZOLE (FLAGYL) IVPB 500 mg     500 mg 100 mL/hr over 60 Minutes Intravenous  Once 07/05/13 0229 07/05/13 0444      Assessment/Plan: Perforated jejunal ulcer  POD #15 s/p Exploratory laparotomy, insertion of gastric tube, repair of jejunal ulcer perforation(Dr. Donne Hazel 07/05/13)  Intra-abdominal abscess-POD #4 s/p perc drain-7.61ml/24h recorded, purulent output.  -? Repeat CT and timing Leukocytosis -trending down, repeat labs in AM Thrombocytosis -ASA, repeat AM labs Atelectasis vs developing pneumonia-afebrile, no tachycardia, has clear productive cough.  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

## 2013-07-20 NOTE — Progress Notes (Signed)
Calorie Count Note  48 hour calorie count ordered.  Diet: Dysphagia 3 Supplements: Ensure Complete BID, Resource Breeze TID  Breakfast: 122 kcal, 8 grams protein Lunch: 207 kcal, 3 grams protein Dinner: none Supplements: 75 kcal, 3 grams protein  Total intake: 404 kcal (25% of minimum estimated needs)  14 grams protein (20% of minimum estimated needs)  Re-estimated needs:  Kcal: 1600-1800  Protein: 72-82 grams Fluid: 1.7-1.9 L  Pt continues to eat <20% of meals due to severe oral pain which pt denies pain relief with use of oral rinses and medications currently prescribed. Pt may benefit from lidocaine viscous solution prior to eating meals to help improve PO intake. Per family in the room, pt has been sipping on Ensure and [employer] but, does not finish the entire bottle.   Nutrition Dx: Inadequate oral intake related to inability to eat, s/p extubation as evidenced by NPO status, ongoing, diet advance but, still inadequate po intake  Goal: Pt to meet >/= 90% of their estimated nutrition needs, currently unmet  Intervention: Continue Ensure Complete BID Continue Resource Breeze BID Provide Liquid multivitamin daily Recommend Lidocaine viscous solution to help relieve oral pain before meals  If G-tube can be used for tube feeding, recommend Ensure Complete TID via PEG in addition to food intake.  RD to continue to monitor  Pryor Ochoa RD, LDN Inpatient Clinical Dietitian Pager: [phone] After Hours Pager: [phone]

## 2013-07-20 NOTE — Progress Notes (Signed)
Occupational Therapy Treatment Patient Details Name: Taylor [name] MRN: [id number] DOB: 11-09-1945 Today's Date: 07/20/2013 Time: 8250-5397 OT Time Calculation (min): 30 min  OT Assessment / Plan / Recommendation  History of present illness 68 yo admitted 1/3 with perforated jejunum requiring emergent exploratory laparotomy with gastrostomy tube placed.  Pt intubated 1/3- 07/09/13.  Pt with h/o ETOH abuse and tobacco use   OT comments  Pt with decr activity tolerance and slow progression toward goals. Pt now recommended for HHOT and plans to d/c home. Pt reports deficits with bed mobility and may need hospital bed at d/c depending on progress. Pt's partner very involved with care and will be able to provide 24/7 care. Pt needs encouragement to use flutter valve.    Follow Up Recommendations  Home health OT;Supervision/Assistance - 24 hour (partner able to provide 24/7 )    Barriers to Discharge       Equipment Recommendations  Other (comment) (question need for hosptial bed)    Recommendations for Other Services    Frequency Min 2X/week   Progress towards OT Goals Progress towards OT goals: Not progressing toward goals - comment;Goals drowngraded-see care plan  Plan Discharge plan needs to be updated    Precautions / Restrictions Precautions Precautions: Fall Precaution Comments: multiple lines including abd JP drain & gastrostomy   Pertinent Vitals/Pain SOB DOE Unable to obtain pulse ox reading - tried 3 times during session    ADL  Eating/Feeding: Other (comment) (peg tube feeds started today) Grooming: Wash/dry hands;Set up Where Assessed - Grooming: Supported sitting Lower Body Bathing: Moderate assistance Where Assessed - Lower Body Bathing: Supported sit to Lobbyist: Moderate assistance Toilet Transfer Method: Stand pivot Toilet Transfer Equipment: Raised toilet seat with arms (or 3-in-1 over toilet) Toileting - Clothing Manipulation and Hygiene:  Moderate assistance Where Assessed - Toileting Clothing Manipulation and Hygiene: Sit to stand from 3-in-1 or toilet Equipment Used: Rolling walker Transfers/Ambulation Related to ADLs: pt stand pivot to 3n1 due to urgency and voiding bowel unaware. pt with very loose mucus like stool dark brown in color ADL Comments: Pt sitting in chair with dressing change by RN at beginning of session. partner and patient report making toilet transfers without oxygen on RA. Pt agreeable to bathroom transfer initially. pt s brother arriving and visiting patient for first time in > 1 week. pt agreeable to static standing. Upon static standing voiding bowel and unaware. pt transferred onto 3n1 with stand pivot. Pt (A) with peri care hygiene. pt reports sitting in chair this AM > 2 hours without repositoin. pt and partner educated on position changes for skin integrity. Pt demonstrates 5 breaths with flutter valve. Pt producing secretions and using [place] this session    OT Diagnosis:    OT Problem List:   OT Treatment Interventions:     OT Goals(current goals can now be found in the care plan section) Acute Rehab OT Goals Patient Stated Goal: To get better and go home OT Goal Formulation: With patient Time For Goal Achievement: 08/03/13 Potential to Achieve Goals: Good ADL Goals Pt Will Perform Grooming: with min guard assist;sitting Pt Will Perform Upper Body Bathing: with min guard assist;sitting Pt Will Perform Lower Body Bathing: with min assist;sitting/lateral leans Pt Will Perform Upper Body Dressing: with min guard assist;sitting Pt Will Perform Lower Body Dressing: with min assist;with adaptive equipment;sitting/lateral leans Pt Will Transfer to Toilet: with min guard assist;ambulating;bedside commode Pt Will Perform Toileting - Clothing Manipulation and hygiene: with min  assist;sitting/lateral leans Pt Will Perform Tub/Shower Transfer: with min assist;shower seat;rolling walker  Visit  Information  Last OT Received On: 07/20/13 Assistance Needed: +1 History of Present Illness: 68 yo admitted 1/3 with perforated jejunum requiring emergent exploratory laparotomy with gastrostomy tube placed.  Pt intubated 1/3- 07/09/13.  Pt with h/o ETOH abuse and tobacco use    Subjective Data      Prior Functioning       Cognition  Cognition Arousal/Alertness: Awake/alert Behavior During Therapy: WFL for tasks assessed/performed Overall Cognitive Status: Within Functional Limits for tasks assessed    Mobility  Transfers Overall transfer level: Needs assistance Transfers: Sit to/from Stand;Stand Pivot Transfers Sit to Stand: Min assist Stand pivot transfers: Min assist General transfer comment: cues for hand placement. pt abandoned RW this session with stand pivot. pt demonstrates DOE. OT unable to get a read using pulse ox with pt on RA. pt demonstrates SOB so using clinical judgement question oxygen saturation levels at thist ime. Pt with very cool digits    Exercises      Balance Balance Standing balance support: During functional activity;Bilateral upper extremity supported Standing balance-Leahy Scale: Poor  End of Session OT - End of Session Activity Tolerance: Patient limited by fatigue Patient left: in chair;with call bell/phone within reach;with family/visitor present Nurse Communication: Mobility status;Precautions  GO     Peri Maris 07/20/2013, 3:17 PM Pager: [phone]

## 2013-07-21 ENCOUNTER — Inpatient Hospital Stay (HOSPITAL_COMMUNITY): Payer: Medicare Other

## 2013-07-21 LAB — GLUCOSE, CAPILLARY
GLUCOSE-CAPILLARY: 101 mg/dL — AB (ref 70–99)
GLUCOSE-CAPILLARY: 124 mg/dL — AB (ref 70–99)
Glucose-Capillary: 100 mg/dL — ABNORMAL HIGH (ref 70–99)
Glucose-Capillary: 104 mg/dL — ABNORMAL HIGH (ref 70–99)
Glucose-Capillary: 120 mg/dL — ABNORMAL HIGH (ref 70–99)
Glucose-Capillary: 76 mg/dL (ref 70–99)

## 2013-07-21 LAB — CBC
HCT: 24.4 % — ABNORMAL LOW (ref 36.0–46.0)
HEMOGLOBIN: 8.4 g/dL — AB (ref 12.0–15.0)
MCH: 28 pg (ref 26.0–34.0)
MCHC: 34.4 g/dL (ref 30.0–36.0)
MCV: 81.3 fL (ref 78.0–100.0)
Platelets: 1079 10*3/uL (ref 150–400)
RBC: 3 MIL/uL — ABNORMAL LOW (ref 3.87–5.11)
RDW: 16.6 % — ABNORMAL HIGH (ref 11.5–15.5)
WBC: 16.4 10*3/uL — ABNORMAL HIGH (ref 4.0–10.5)

## 2013-07-21 LAB — BASIC METABOLIC PANEL
BUN: 4 mg/dL — ABNORMAL LOW (ref 6–23)
CHLORIDE: 100 meq/L (ref 96–112)
CO2: 21 mEq/L (ref 19–32)
Calcium: 6.7 mg/dL — ABNORMAL LOW (ref 8.4–10.5)
Creatinine, Ser: 0.48 mg/dL — ABNORMAL LOW (ref 0.50–1.10)
GFR calc Af Amer: >90 mL/min (ref >90)
GFR calc non Af Amer: >90 mL/min (ref >90)
GLUCOSE: 99 mg/dL (ref 70–99)
POTASSIUM: 3.9 meq/L (ref 3.7–5.3)
Sodium: 133 mEq/L — ABNORMAL LOW (ref 137–147)

## 2013-07-21 LAB — PREALBUMIN: Prealbumin: 5.9 mg/dL — ABNORMAL LOW (ref 17.0–34.0)

## 2013-07-21 MED ORDER — ENSURE COMPLETE PO LIQD
237.0000 mL | Freq: Three times a day (TID) | ORAL | Status: DC
  Administered 2013-07-21 – 2013-07-25 (×9): 237 mL

## 2013-07-21 MED ORDER — IOHEXOL 300 MG/ML  SOLN
80.0000 mL | Freq: Once | INTRAMUSCULAR | Status: AC | PRN
  Administered 2013-07-21: 80 mL via INTRAVENOUS

## 2013-07-21 NOTE — Progress Notes (Signed)
16 Days Post-Op  Subjective: Pt states she feels a little better; has been ambulating in hallway; still has some abd discomfort  Objective: Vital signs in last 24 hours: Temp:  [98.1 F (36.7 C)-98.3 F (36.8 C)] 98.1 F (36.7 C) (01/20 0922) Pulse Rate:  [74-111] 103 (01/20 0922) Resp:  [18-20] 19 (01/20 0922) BP: (116-148)/(64-80) 140/75 mmHg (01/20 0922) SpO2:  [90 %-99 %] 95 % (01/20 0922) Weight:  [120 lb 6.4 oz (54.613 kg)] 120 lb 6.4 oz (54.613 kg) (01/20 0500) Last BM Date: 07/19/13  Intake/Output from previous day: 01/19 0701 - 01/20 0700 In: 904 [I.V.:667] Out: 400 [Urine:400] Intake/Output this shift:    Left abd drain intact, output recorded as minimal but upon irrigating drain today, approx 20-30 cc's turbid, beige colored fluid aspirated back; insertion site ok, mildly tender; cx's - klebsiella  Lab Results:   Recent Labs  07/21/13 0418  WBC 16.4*  HGB 8.4*  HCT 24.4*  PLT 1079*   BMET  Recent Labs  07/19/13 0415 07/21/13 0418  NA  --  133*  K  --  3.9  CL  --  100  CO2  --  21  GLUCOSE  --  99  BUN  --  4*  CREATININE 0.52 0.48*  CALCIUM  --  6.7*   PT/INR No results found for this basename: LABPROT, INR,  in the last 72 hours ABG No results found for this basename: PHART, PCO2, PO2, HCO3,  in the last 72 hours  Studies/Results: Ct Abdomen Pelvis W Contrast  07/21/2013   CLINICAL DATA:  History of abdominal abscess.  EXAM: CT ABDOMEN AND PELVIS WITH CONTRAST  TECHNIQUE: Multidetector CT imaging of the abdomen and pelvis was performed using the standard protocol following bolus administration of intravenous contrast.  CONTRAST:  12mL OMNIPAQUE IOHEXOL 300 MG/ML  SOLN  COMPARISON:  07/15/2013  FINDINGS: Bilateral pleural effusions are again identified left greater than right. There is overlying compressive type consolidation and atelectasis.  No focal liver abnormality identified. The gallbladder appears normal. No biliary dilatation. Normal  appearance of the pancreas. The left upper quadrant subphrenic abscess which has mass effect upon the spleen is again noted. This measures 6.7 x 2.5 cm, image 64/ coronal series. On the previous examination this measured 7.3 x 2.1 cm. Calcified granulomas within the splenic parenchyma are again noted.  Normal appearance of the adrenal glands. The kidneys are both on unremarkable. Urinary bladder appears normal. Partially calcified fibroid uterus is again noted.  There is calcified atherosclerotic disease involving the abdominal aorta. No aneurysm identified. Multiple reactive lymph nodes are noted in the upper abdomen. No significant adenopathy. No pelvic or inguinal adenopathy noted. There is a left upper quadrant pigtail drainage catheter identified. The associated a bilobed fluid collection is again noted. This measures 5.9 x 2.7 cm, image 41/series 2. Previously this measured 7.2 x 3.5 cm. Adjacent fluid collection posterior to the stomach measures 3 x 1.2 cm, image 40/series 2. Previously this measured 3 x 1.4 cm. Foci of extraluminal gas within the left abdomen appear associated with small bowel loops may indicate presence of fistula or sinus tract with the adjacent colon, image 50/series 2.  Gastrostomy tube is identified is identified within the stomach. The small bowel loops have a normal caliber. There is no evidence for bowel obstruction. Enteric contrast material reaches the colon and rectum.  Review of the visualized osseous structures is significant for mild multilevel spondylosis. 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  IMPRESSION: 1. Mild decrease in size of small bowel mesenteric abscess the pigtail drainage catheter remains in place. 2. No significant change in subphrenic abscess which has mass effect upon the spleen. 3. Again noted are foci of extraluminal gas within the left abdomen which may indicate the presence of a sinus tract or fistula between small enlarged bowel  loops. No extravasation of contrast material is identified. 4. Bilateral pleural effusions.   Electronically Signed   By: Kerby Moors M.D.   On: 07/21/2013 10:07    Anti-infectives: Anti-infectives   Start     Dose/Rate Route Frequency Ordered Stop   07/18/13 1000  fluconazole (DIFLUCAN) tablet 200 mg     200 mg Oral Daily 07/17/13 0940     07/15/13 1400  ertapenem (INVANZ) 1 g in sodium chloride 0.9 % 50 mL IVPB     1 g 100 mL/hr over 30 Minutes Intravenous Every 24 hours 07/15/13 1311     07/14/13 1045  amoxicillin-clavulanate (AUGMENTIN) 875-125 MG per tablet 1 tablet  Status:  Discontinued     1 tablet Oral Every 12 hours 07/14/13 1033 07/15/13 1311   07/07/13 0815  fluconazole (DIFLUCAN) IVPB 200 mg  Status:  Discontinued     200 mg 100 mL/hr over 60 Minutes Intravenous Every 24 hours 07/06/13 1030 07/17/13 0940   07/05/13 0815  fluconazole (DIFLUCAN) IVPB 400 mg  Status:  Discontinued     400 mg 100 mL/hr over 120 Minutes Intravenous Every 24 hours 07/05/13 0800 07/06/13 1030   07/05/13 0800  piperacillin-tazobactam (ZOSYN) IVPB 3.375 g  Status:  Discontinued     3.375 g 12.5 mL/hr over 240 Minutes Intravenous 3 times per day 07/05/13 0742 07/14/13 1033   07/05/13 0230  piperacillin-tazobactam (ZOSYN) IVPB 3.375 g     3.375 g 100 mL/hr over 30 Minutes Intravenous  Once 07/05/13 0229 07/05/13 0344   07/05/13 0230  metroNIDAZOLE (FLAGYL) IVPB 500 mg     500 mg 100 mL/hr over 60 Minutes Intravenous  Once 07/05/13 0229 07/05/13 0444      Assessment/Plan: s/p left abd abscess drainage 1/15; f/u CT reviewed by Dr. Pascal Lux today-mild decrease in size of drained abscess, no new collections; recommends cont current tx- strict tid drain irrigation, ambulation, incent spirometry, check f/u CT in 1 week; subphrenic collection stable- attempt at drainage of this would be difficult due to proximity to diaphragm and could potentially create fistula to pleural space  LOS: 17 days     Latacha Texeira,D [facility] 07/21/2013

## 2013-07-21 NOTE — Progress Notes (Signed)
16 Days Post-Op  Subjective: Looks much better today. Had a BM last evening.  Ensure per tube, daughter [name] participating in care.  No dysuria.   Objective: Vital signs in last 24 hours: Temp:  [98.2 F (36.8 C)-98.3 F (36.8 C)] 98.3 F (36.8 C) (01/20 0445) Pulse Rate:  [74-111] 79 (01/20 0445) Resp:  [18-20] 20 (01/20 0445) BP: (116-148)/(64-80) 116/67 mmHg (01/20 0445) SpO2:  [90 %-99 %] 90 % (01/20 0445) Weight:  [120 lb 6.4 oz (54.613 kg)] 120 lb 6.4 oz (54.613 kg) (01/20 0500) Last BM Date: 07/19/13  Intake/Output from previous day: 01/19 0701 - 01/20 0700 In: 904 [I.V.:667] Out: 400 [Urine:400] Intake/Output this shift:    PE General appearance: alert, cooperative and no distress  Resp: clear to auscultation bilaterally  Cardio: irregularly irregular, no murmurs gallops or rubs  GI: +bs, abdomen is soft appropriately tender. midline incison is c/d/i. g tube clamped. drain with purulent output.    Lab Results:   Recent Labs  07/21/13 0418  WBC 16.4*  HGB 8.4*  HCT 24.4*  PLT 1079*   BMET  Recent Labs  07/19/13 0415 07/21/13 0418  NA  --  133*  K  --  3.9  CL  --  100  CO2  --  21  GLUCOSE  --  99  BUN  --  4*  CREATININE 0.52 0.48*  CALCIUM  --  6.7*   PT/INR No results found for this basename: LABPROT, INR,  in the last 72 hours ABG No results found for this basename: PHART, PCO2, PO2, HCO3,  in the last 72 hours  Studies/Results: No results found.  Anti-infectives: Anti-infectives   Start     Dose/Rate Route Frequency Ordered Stop   07/18/13 1000  fluconazole (DIFLUCAN) tablet 200 mg     200 mg Oral Daily 07/17/13 0940     07/15/13 1400  ertapenem (INVANZ) 1 g in sodium chloride 0.9 % 50 mL IVPB     1 g 100 mL/hr over 30 Minutes Intravenous Every 24 hours 07/15/13 1311     07/14/13 1045  amoxicillin-clavulanate (AUGMENTIN) 875-125 MG per tablet 1 tablet  Status:  Discontinued     1 tablet Oral Every 12 hours 07/14/13 1033 07/15/13  1311   07/07/13 0815  fluconazole (DIFLUCAN) IVPB 200 mg  Status:  Discontinued     200 mg 100 mL/hr over 60 Minutes Intravenous Every 24 hours 07/06/13 1030 07/17/13 0940   07/05/13 0815  fluconazole (DIFLUCAN) IVPB 400 mg  Status:  Discontinued     400 mg 100 mL/hr over 120 Minutes Intravenous Every 24 hours 07/05/13 0800 07/06/13 1030   07/05/13 0800  piperacillin-tazobactam (ZOSYN) IVPB 3.375 g  Status:  Discontinued     3.375 g 12.5 mL/hr over 240 Minutes Intravenous 3 times per day 07/05/13 0742 07/14/13 1033   07/05/13 0230  piperacillin-tazobactam (ZOSYN) IVPB 3.375 g     3.375 g 100 mL/hr over 30 Minutes Intravenous  Once 07/05/13 0229 07/05/13 0344   07/05/13 0230  metroNIDAZOLE (FLAGYL) IVPB 500 mg     500 mg 100 mL/hr over 60 Minutes Intravenous  Once 07/05/13 0229 07/05/13 0444      Assessment/Plan: Perforated jejunal ulcer  POD #16 s/p Exploratory laparotomy, insertion of gastric tube, repair of jejunal ulcer perforation(Dr. Donne Hazel 07/05/13)  Intra-abdominal abscess-POD #5 s/p perc drain-72ml/24h output recorded -cultures show klebsiella pneumoniae sensitive to imipenem  - Invanz D#6 Leukocytosis -up a bit today, afebrile, repeat CT.  Thrombocytosis -ASA  Atelectasis vs developing pneumonia-afebrile, no tachycardia, has clear productive cough. Encourage IS use and monitor for developing PNA  Respiratory failure - s/p laryngeal cancer, chemoradiation - resolved  PCM - Has g-tube, poor oral intake, calorie count, check pre-albumin, ensure per tube BM  Hypokalemia-resolved Afib with RVR-rate controlled  ABL anemia-h&h are stable  Hypothyroidism-home meds  Hx ETOH abuse-MVI  Dispo-pending resolution of abscess   LOS: 17 days   Angalena Cousineau, [facility] ANP-BC Pager [phone] 07/21/2013 7:17 AM

## 2013-07-21 NOTE — Progress Notes (Signed)
I have seen and examined the patient and agree with the assessment and plans. Will review CT  Dim Meisinger A. Ninfa Linden  MD, FACS

## 2013-07-21 NOTE — Progress Notes (Signed)
Physical Therapy Treatment Patient Details Name: Taylor [name] MRN: [id number] DOB: 07/05/1945 Today's Date: 07/21/2013 Time: 8921-1941 PT Time Calculation (min): 30 min  PT Assessment / Plan / Recommendation  History of Present Illness 68 yo admitted 1/3 with perforated jejunum requiring emergent exploratory laparotomy with gastrostomy tube placed.  Pt intubated 1/3- 07/09/13.  Pt with h/o ETOH abuse and tobacco use   PT Comments   Patient with noted fatigue this session. Patient requested to go back to bed for dressing change. Continue with current POC  Follow Up Recommendations  Home health PT;Supervision for mobility/OOB     Does the patient have the potential to tolerate intense rehabilitation     Barriers to Discharge        Equipment Recommendations  Rolling walker with 5" wheels    Recommendations for Other Services    Frequency Min 3X/week   Progress towards PT Goals Progress towards PT goals: Progressing toward goals  Plan Current plan remains appropriate    Precautions / Restrictions Precautions Precautions: Fall Precaution Comments: multiple lines including abd JP drain & gastrostomy   Pertinent Vitals/Pain Denied pain    Mobility  Bed Mobility Supine to sit: Min assist Sit to supine: Min assist General bed mobility comments: A for trunk. Cues for technique and positioning.  Transfers Overall transfer level: Needs assistance Equipment used: Rolling walker (2 wheeled) Transfers: Sit to/from Stand Sit to Stand: Min assist Stand pivot transfers: Min assist General transfer comment: Patient required A to ensure transition into standing and ensured balance.  Ambulation/Gait Ambulation/Gait assistance: Min assist Ambulation Distance (Feet): 150 Feet Assistive device: Rolling walker (2 wheeled) Gait Pattern/deviations: Step-through pattern;Trunk flexed Gait velocity: decreased General Gait Details: Patient with increased fatigue this session. Did not need  to sit however had to encourage patient to breath and take standing rest breaks.     Exercises     PT Diagnosis:    PT Problem List:   PT Treatment Interventions:     PT Goals (current goals can now be found in the care plan section)    Visit Information  Last PT Received On: 07/21/13 Assistance Needed: +1 History of Present Illness: 68 yo admitted 1/3 with perforated jejunum requiring emergent exploratory laparotomy with gastrostomy tube placed.  Pt intubated 1/3- 07/09/13.  Pt with h/o ETOH abuse and tobacco use    Subjective Data      Cognition  Cognition Arousal/Alertness: Awake/alert Behavior During Therapy: WFL for tasks assessed/performed Overall Cognitive Status: Within Functional Limits for tasks assessed    Balance     End of Session PT - End of Session Activity Tolerance: Patient limited by fatigue;Patient tolerated treatment well Patient left: in bed;with call bell/phone within reach Nurse Communication: Mobility status   GP     Jacqualyn Posey 07/21/2013, 2:48 PM 07/21/2013 Jacqualyn Posey PTA [phone] pager [phone] office

## 2013-07-21 NOTE — Progress Notes (Signed)
Agree with dietetic intern note. Kennidy Lamke Barnett RD, LDN Pager: [phone]  

## 2013-07-21 NOTE — Progress Notes (Signed)
Calorie Count Note   48 hour calorie count ordered.   Diet: Dysphagia 3  Supplements: Ensure Complete TID, Resource Breeze TID   Breakfast: 151 kcal, 2 grams protein  Lunch: 25 kcal, no significant amount protein Dinner: no significant amount of kcal, protein  Supplements: 1050 kcal, 39 grams protein  Total intake:  1226 kcal (77% of minimum estimated needs) 41 grams protein (57% of minimum estimated needs)   Re-estimated needs:  Kcal: 1600-1800  Protein: 72-82 grams  Fluid: 1.7-1.9 L   Patient continues to eat <20% of meals due to severe oral pain which pt denies pain relief with use of oral rinses and medications currently prescribed. Per patient's daughter Ensure Complete via G tube has been reported to be well tolerated. Patient reported no nausea when Ensure Complete is administered via G tube. Patient's daughter reported that since Ensure complete has been administered via G tube, the patient has had more energy. Patient continues to take Resource Breeze PO.   Nutrition Dx: Inadequate oral intake related to inability to eat, s/p extubation as evidenced by NPO status, ongoing, diet advance but, still inadequate po intake, ongoing   Goal: Pt to meet >/= 90% of their estimated nutrition needs, currently unmet   Intervention:  Continue Resource Breeze BID  Provide Liquid multivitamin daily  Recommend Lidocaine viscous solution to help relieve oral pain before meals  Ensure Complete TID via G tube in addition to food intake.  RD to continue to monitor   Claudell Kyle, Dietetic Intern Pager: [phone]

## 2013-07-22 LAB — GLUCOSE, CAPILLARY
GLUCOSE-CAPILLARY: 94 mg/dL (ref 70–99)
GLUCOSE-CAPILLARY: 104 mg/dL — AB (ref 70–99)
Glucose-Capillary: 116 mg/dL — ABNORMAL HIGH (ref 70–99)
Glucose-Capillary: 122 mg/dL — ABNORMAL HIGH (ref 70–99)
Glucose-Capillary: 81 mg/dL (ref 70–99)
Glucose-Capillary: 87 mg/dL (ref 70–99)

## 2013-07-22 MED ORDER — MAGIC MOUTHWASH W/LIDOCAINE
5.0000 mL | Freq: Three times a day (TID) | ORAL | Status: DC | PRN
  Filled 2013-07-22: qty 5

## 2013-07-22 MED ORDER — VITAMIN B-1 100 MG PO TABS
100.0000 mg | ORAL_TABLET | Freq: Every day | ORAL | Status: DC
  Administered 2013-07-23 – 2013-07-27 (×5): 100 mg via ORAL
  Filled 2013-07-22 (×5): qty 1

## 2013-07-22 MED ORDER — FOLIC ACID 1 MG PO TABS
1.0000 mg | ORAL_TABLET | Freq: Every day | ORAL | Status: DC
  Administered 2013-07-23 – 2013-07-27 (×5): 1 mg via ORAL
  Filled 2013-07-22 (×5): qty 1

## 2013-07-22 MED ORDER — METRONIDAZOLE 500 MG PO TABS
500.0000 mg | ORAL_TABLET | Freq: Four times a day (QID) | ORAL | Status: DC
  Administered 2013-07-22 – 2013-07-24 (×9): 500 mg via ORAL
  Filled 2013-07-22 (×13): qty 1

## 2013-07-22 MED ORDER — CIPROFLOXACIN HCL 500 MG PO TABS
500.0000 mg | ORAL_TABLET | Freq: Two times a day (BID) | ORAL | Status: DC
  Administered 2013-07-22 – 2013-07-24 (×5): 500 mg via ORAL
  Filled 2013-07-22 (×7): qty 1

## 2013-07-22 NOTE — Progress Notes (Signed)
17 Days Post-Op  Subjective: Low abd abscess drain placed  1/15 CT 1/20 - see report Pt feeling some better  Objective: Vital signs in last 24 hours: Temp:  [98.1 F (36.7 C)-99.3 F (37.4 C)] 99.3 F (37.4 C) (01/21 0533) Pulse Rate:  [97-106] 97 (01/21 0533) Resp:  [16-19] 16 (01/21 0533) BP: (108-157)/(48-75) 109/48 mmHg (01/21 0533) SpO2:  [90 %-96 %] 90 % (01/21 0533) Weight:  [120 lb 2.4 oz (54.5 kg)] 120 lb 2.4 oz (54.5 kg) (01/21 0533) Last BM Date: 07/21/13  Intake/Output from previous day: 01/20 0701 - 01/21 0700 In: 15  Out: 20 [Drains:20] Intake/Output this shift:    PE:  Afeb; vss Site of drain clean and dry NT Output 50 cc yesterday( 20 cc recorded + 30 cc after flush asp with IR PA) 15 cc in JP now- yellow/ cloudy   Lab Results:   Recent Labs  07/21/13 0418  WBC 16.4*  HGB 8.4*  HCT 24.4*  PLT 1079*   BMET  Recent Labs  07/21/13 0418  NA 133*  K 3.9  CL 100  CO2 21  GLUCOSE 99  BUN 4*  CREATININE 0.48*  CALCIUM 6.7*   PT/INR No results found for this basename: LABPROT, INR,  in the last 72 hours ABG No results found for this basename: PHART, PCO2, PO2, HCO3,  in the last 72 hours  Studies/Results: Ct Abdomen Pelvis W Contrast  07/21/2013   CLINICAL DATA:  History of abdominal abscess.  EXAM: CT ABDOMEN AND PELVIS WITH CONTRAST  TECHNIQUE: Multidetector CT imaging of the abdomen and pelvis was performed using the standard protocol following bolus administration of intravenous contrast.  CONTRAST:  63mL OMNIPAQUE IOHEXOL 300 MG/ML  SOLN  COMPARISON:  07/15/2013  FINDINGS: Bilateral pleural effusions are again identified left greater than right. There is overlying compressive type consolidation and atelectasis.  No focal liver abnormality identified. The gallbladder appears normal. No biliary dilatation. Normal appearance of the pancreas. The left upper quadrant subphrenic abscess which has mass effect upon the spleen is again noted. This  measures 6.7 x 2.5 cm, image 64/ coronal series. On the previous examination this measured 7.3 x 2.1 cm. Calcified granulomas within the splenic parenchyma are again noted.  Normal appearance of the adrenal glands. The kidneys are both on unremarkable. Urinary bladder appears normal. Partially calcified fibroid uterus is again noted.  There is calcified atherosclerotic disease involving the abdominal aorta. No aneurysm identified. Multiple reactive lymph nodes are noted in the upper abdomen. No significant adenopathy. No pelvic or inguinal adenopathy noted. There is a left upper quadrant pigtail drainage catheter identified. The associated a bilobed fluid collection is again noted. This measures 5.9 x 2.7 cm, image 41/series 2. Previously this measured 7.2 x 3.5 cm. Adjacent fluid collection posterior to the stomach measures 3 x 1.2 cm, image 40/series 2. Previously this measured 3 x 1.4 cm. Foci of extraluminal gas within the left abdomen appear associated with small bowel loops may indicate presence of fistula or sinus tract with the adjacent colon, image 50/series 2.  Gastrostomy tube is identified is identified within the stomach. The small bowel loops have a normal caliber. There is no evidence for bowel obstruction. Enteric contrast material reaches the colon and rectum.  Review of the visualized osseous structures is significant for mild multilevel spondylosis. 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  IMPRESSION: 1. Mild decrease in size of small bowel mesenteric abscess the pigtail drainage catheter remains in place. 2.  No significant change in subphrenic abscess which has mass effect upon the spleen. 3. Again noted are foci of extraluminal gas within the left abdomen which may indicate the presence of a sinus tract or fistula between small enlarged bowel loops. No extravasation of contrast material is identified. 4. Bilateral pleural effusions.   Electronically Signed   By: Kerby Moors M.D.   On: 07/21/2013 10:07    Anti-infectives: Anti-infectives   Start     Dose/Rate Route Frequency Ordered Stop   07/18/13 1000  fluconazole (DIFLUCAN) tablet 200 mg     200 mg Oral Daily 07/17/13 0940     07/15/13 1400  ertapenem (INVANZ) 1 g in sodium chloride 0.9 % 50 mL IVPB     1 g 100 mL/hr over 30 Minutes Intravenous Every 24 hours 07/15/13 1311     07/14/13 1045  amoxicillin-clavulanate (AUGMENTIN) 875-125 MG per tablet 1 tablet  Status:  Discontinued     1 tablet Oral Every 12 hours 07/14/13 1033 07/15/13 1311   07/07/13 0815  fluconazole (DIFLUCAN) IVPB 200 mg  Status:  Discontinued     200 mg 100 mL/hr over 60 Minutes Intravenous Every 24 hours 07/06/13 1030 07/17/13 0940   07/05/13 0815  fluconazole (DIFLUCAN) IVPB 400 mg  Status:  Discontinued     400 mg 100 mL/hr over 120 Minutes Intravenous Every 24 hours 07/05/13 0800 07/06/13 1030   07/05/13 0800  piperacillin-tazobactam (ZOSYN) IVPB 3.375 g  Status:  Discontinued     3.375 g 12.5 mL/hr over 240 Minutes Intravenous 3 times per day 07/05/13 0742 07/14/13 1033   07/05/13 0230  piperacillin-tazobactam (ZOSYN) IVPB 3.375 g     3.375 g 100 mL/hr over 30 Minutes Intravenous  Once 07/05/13 0229 07/05/13 0344   07/05/13 0230  metroNIDAZOLE (FLAGYL) IVPB 500 mg     500 mg 100 mL/hr over 60 Minutes Intravenous  Once 07/05/13 0229 07/05/13 0444      Assessment/Plan: s/p Procedure(s): EXPLORATORY LAPAROTOMY, insertion of gastrostomy tube, repair jejunal ulcer perforation. (N/A)  Abd abscess drain intact Continue for now Repeat CT 1 week or so Plan per [facility]   LOS: 18 days    Dierre Crevier A 07/22/2013

## 2013-07-22 NOTE — Progress Notes (Signed)
17 Days Post-Op  Subjective: No changes.  Tolerating diet, ensure per G tube.  No sob, cp.    Objective: Vital signs in last 24 hours: Temp:  [98.1 F (36.7 C)-99.3 F (37.4 C)] 99.3 F (37.4 C) (01/21 0533) Pulse Rate:  [97-106] 97 (01/21 0533) Resp:  [16-19] 16 (01/21 0533) BP: (108-157)/(48-75) 109/48 mmHg (01/21 0533) SpO2:  [90 %-96 %] 90 % (01/21 0533) Weight:  [120 lb 2.4 oz (54.5 kg)] 120 lb 2.4 oz (54.5 kg) (01/21 0533) Last BM Date: 07/21/13  Intake/Output from previous day: 01/20 0701 - 01/21 0700 In: 15  Out: 20 [Drains:20] Intake/Output this shift:   PE  General appearance: alert, cooperative and no distress  Resp: clear to auscultation bilaterally  Cardio: s1s2 rrr, no murmurs gallops or rubs  GI: +bs, abdomen is soft appropriately tender. midline incison is c/d/i. g tube clamped. drain with minimal purulent output   Lab Results:   Recent Labs  07/21/13 0418  WBC 16.4*  HGB 8.4*  HCT 24.4*  PLT 1079*   BMET  Recent Labs  07/21/13 0418  NA 133*  K 3.9  CL 100  CO2 21  GLUCOSE 99  BUN 4*  CREATININE 0.48*  CALCIUM 6.7*   PT/INR No results found for this basename: LABPROT, INR,  in the last 72 hours ABG No results found for this basename: PHART, PCO2, PO2, HCO3,  in the last 72 hours  Studies/Results: Ct Abdomen Pelvis W Contrast  07/21/2013   CLINICAL DATA:  History of abdominal abscess.  EXAM: CT ABDOMEN AND PELVIS WITH CONTRAST  TECHNIQUE: Multidetector CT imaging of the abdomen and pelvis was performed using the standard protocol following bolus administration of intravenous contrast.  CONTRAST:  42mL OMNIPAQUE IOHEXOL 300 MG/ML  SOLN  COMPARISON:  07/15/2013  FINDINGS: Bilateral pleural effusions are again identified left greater than right. There is overlying compressive type consolidation and atelectasis.  No focal liver abnormality identified. The gallbladder appears normal. No biliary dilatation. Normal appearance of the pancreas. The  left upper quadrant subphrenic abscess which has mass effect upon the spleen is again noted. This measures 6.7 x 2.5 cm, image 64/ coronal series. On the previous examination this measured 7.3 x 2.1 cm. Calcified granulomas within the splenic parenchyma are again noted.  Normal appearance of the adrenal glands. The kidneys are both on unremarkable. Urinary bladder appears normal. Partially calcified fibroid uterus is again noted.  There is calcified atherosclerotic disease involving the abdominal aorta. No aneurysm identified. Multiple reactive lymph nodes are noted in the upper abdomen. No significant adenopathy. No pelvic or inguinal adenopathy noted. There is a left upper quadrant pigtail drainage catheter identified. The associated a bilobed fluid collection is again noted. This measures 5.9 x 2.7 cm, image 41/series 2. Previously this measured 7.2 x 3.5 cm. Adjacent fluid collection posterior to the stomach measures 3 x 1.2 cm, image 40/series 2. Previously this measured 3 x 1.4 cm. Foci of extraluminal gas within the left abdomen appear associated with small bowel loops may indicate presence of fistula or sinus tract with the adjacent colon, image 50/series 2.  Gastrostomy tube is identified is identified within the stomach. The small bowel loops have a normal caliber. There is no evidence for bowel obstruction. Enteric contrast material reaches the colon and rectum.  Review of the visualized osseous structures is significant for mild multilevel spondylosis. 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  IMPRESSION: 1. Mild decrease in size of small bowel mesenteric abscess  the pigtail drainage catheter remains in place. 2. No significant change in subphrenic abscess which has mass effect upon the spleen. 3. Again noted are foci of extraluminal gas within the left abdomen which may indicate the presence of a sinus tract or fistula between small enlarged bowel loops. No extravasation of contrast  material is identified. 4. Bilateral pleural effusions.   Electronically Signed   By: Kerby Moors M.D.   On: 07/21/2013 10:07    Anti-infectives: Anti-infectives   Start     Dose/Rate Route Frequency Ordered Stop   07/18/13 1000  fluconazole (DIFLUCAN) tablet 200 mg     200 mg Oral Daily 07/17/13 0940     07/15/13 1400  ertapenem (INVANZ) 1 g in sodium chloride 0.9 % 50 mL IVPB     1 g 100 mL/hr over 30 Minutes Intravenous Every 24 hours 07/15/13 1311     07/14/13 1045  amoxicillin-clavulanate (AUGMENTIN) 875-125 MG per tablet 1 tablet  Status:  Discontinued     1 tablet Oral Every 12 hours 07/14/13 1033 07/15/13 1311   07/07/13 0815  fluconazole (DIFLUCAN) IVPB 200 mg  Status:  Discontinued     200 mg 100 mL/hr over 60 Minutes Intravenous Every 24 hours 07/06/13 1030 07/17/13 0940   07/05/13 0815  fluconazole (DIFLUCAN) IVPB 400 mg  Status:  Discontinued     400 mg 100 mL/hr over 120 Minutes Intravenous Every 24 hours 07/05/13 0800 07/06/13 1030   07/05/13 0800  piperacillin-tazobactam (ZOSYN) IVPB 3.375 g  Status:  Discontinued     3.375 g 12.5 mL/hr over 240 Minutes Intravenous 3 times per day 07/05/13 0742 07/14/13 1033   07/05/13 0230  piperacillin-tazobactam (ZOSYN) IVPB 3.375 g     3.375 g 100 mL/hr over 30 Minutes Intravenous  Once 07/05/13 0229 07/05/13 0344   07/05/13 0230  metroNIDAZOLE (FLAGYL) IVPB 500 mg     500 mg 100 mL/hr over 60 Minutes Intravenous  Once 07/05/13 0229 07/05/13 0444      Assessment/Plan: Perforated jejunal ulcer  POD #17 s/p Exploratory laparotomy, insertion of gastric tube, repair of jejunal ulcer perforation(Dr. Donne Hazel 07/05/13)  Intra-abdominal abscess-POD #6 s/p perc drain-10ml/24h output recorded  -cultures show klebsiella pneumoniae sensitive to imipenem  - Invanz D#7  -per IR "subphrenic collection stable- attempt at drainage of this would be difficult due to proximity to diaphragm and could potentially create fistula to pleural  space" -will change to cipro flagyl, she will need to be on extended atbx -repeat CT in 1 week Leukocytosis -repeat CT shows a decrease in abscess, abscess to LUQ Thrombocytosis -ASA  Atelectasis vs developing pneumonia-afebrile, no tachycardia, has clear productive cough. Encourage IS use and monitor for developing PNA  Respiratory failure - s/p laryngeal cancer, chemoradiation - resolved  PCM/Hypoalbuminemia-ensure TID per G tube, resource breeze BID, MVI Hypokalemia-resolved  Afib with RVR-rate controlled  ABL anemia-h&h are stable  Hypothyroidism-home meds  Hx ETOH abuse-MVI  Dispo-initiate discharge planning, home health, drain care, TF as above    LOS: 18 days   Oneida Arenas [facility] ANP-BC Pager [phone] 07/22/2013 7:37 AM

## 2013-07-22 NOTE — Progress Notes (Signed)
I have seen and examined the patient and agree with the assessment and plans. As collections can't be drained by IR and she is clinically improving, will treat conservatively  Syris Brookens A. Ninfa Linden  MD, FACS

## 2013-07-23 ENCOUNTER — Inpatient Hospital Stay (HOSPITAL_COMMUNITY): Payer: Medicare Other

## 2013-07-23 DIAGNOSIS — J90 Pleural effusion, not elsewhere classified: Secondary | ICD-10-CM

## 2013-07-23 LAB — CBC
HEMATOCRIT: 22.7 % — AB (ref 36.0–46.0)
HEMOGLOBIN: 7.6 g/dL — AB (ref 12.0–15.0)
MCH: 27.9 pg (ref 26.0–34.0)
MCHC: 33.5 g/dL (ref 30.0–36.0)
MCV: 83.5 fL (ref 78.0–100.0)
Platelets: 1256 10*3/uL (ref 150–400)
RBC: 2.72 MIL/uL — ABNORMAL LOW (ref 3.87–5.11)
RDW: 17.2 % — ABNORMAL HIGH (ref 11.5–15.5)
WBC: 18.6 10*3/uL — ABNORMAL HIGH (ref 4.0–10.5)

## 2013-07-23 LAB — EXPECTORATED SPUTUM ASSESSMENT W GRAM STAIN, RFLX TO RESP C: Special Requests: NORMAL

## 2013-07-23 LAB — EXPECTORATED SPUTUM ASSESSMENT W REFEX TO RESP CULTURE

## 2013-07-23 LAB — GLUCOSE, CAPILLARY
Glucose-Capillary: 102 mg/dL — ABNORMAL HIGH (ref 70–99)
Glucose-Capillary: 136 mg/dL — ABNORMAL HIGH (ref 70–99)
Glucose-Capillary: 91 mg/dL (ref 70–99)
Glucose-Capillary: 97 mg/dL (ref 70–99)
Glucose-Capillary: 97 mg/dL (ref 70–99)
Glucose-Capillary: 98 mg/dL (ref 70–99)

## 2013-07-23 MED ORDER — DILTIAZEM HCL ER COATED BEADS 240 MG PO CP24
240.0000 mg | ORAL_CAPSULE | Freq: Every day | ORAL | Status: DC
  Filled 2013-07-23: qty 1

## 2013-07-23 MED ORDER — ADULT MULTIVITAMIN LIQUID CH: 5.0000 mL | Freq: Every day | ORAL | Status: DC

## 2013-07-23 MED ORDER — BOOST / RESOURCE BREEZE PO LIQD: 1.0000 | Freq: Two times a day (BID) | ORAL | Status: DC

## 2013-07-23 MED ORDER — DILTIAZEM HCL ER COATED BEADS 120 MG PO CP24
240.0000 mg | ORAL_CAPSULE | Freq: Every day | ORAL | Status: DC
  Filled 2013-07-23: qty 2

## 2013-07-23 MED ORDER — DILTIAZEM 12 MG/ML ORAL SUSPENSION
60.0000 mg | Freq: Four times a day (QID) | ORAL | Status: DC
  Administered 2013-07-23 – 2013-07-27 (×16): 60 mg
  Filled 2013-07-23 (×19): qty 6

## 2013-07-23 MED ORDER — POTASSIUM CHLORIDE 20 MEQ/15ML (10%) PO LIQD: 40.0000 meq | Freq: Every day | ORAL | Status: DC

## 2013-07-23 MED ORDER — ASPIRIN 325 MG PO TABS: 325.0000 mg | ORAL_TABLET | Freq: Every day | ORAL | Status: DC

## 2013-07-23 MED ORDER — METRONIDAZOLE 500 MG PO TABS: 500.0000 mg | ORAL_TABLET | Freq: Four times a day (QID) | ORAL | Status: DC

## 2013-07-23 MED ORDER — PIPERACILLIN-TAZOBACTAM 3.375 G IVPB
3.3750 g | Freq: Three times a day (TID) | INTRAVENOUS | Status: DC
Start: 2013-07-23 — End: 2013-07-23
  Filled 2013-07-23 (×2): qty 50

## 2013-07-23 MED ORDER — FUROSEMIDE 10 MG/ML IJ SOLN
40.0000 mg | Freq: Two times a day (BID) | INTRAMUSCULAR | Status: DC
  Administered 2013-07-23 – 2013-07-25 (×5): 40 mg via INTRAVENOUS
  Filled 2013-07-23 (×7): qty 4

## 2013-07-23 MED ORDER — NYSTATIN 100000 UNIT/ML MT SUSP: 5.0000 mL | Freq: Four times a day (QID) | OROMUCOSAL | Status: AC

## 2013-07-23 MED ORDER — CIPROFLOXACIN HCL 500 MG PO TABS: 500.0000 mg | ORAL_TABLET | Freq: Two times a day (BID) | ORAL | Status: DC

## 2013-07-23 MED ORDER — HYDROCODONE-ACETAMINOPHEN 7.5-325 MG/15ML PO SOLN: 10.0000 mL | ORAL | Status: DC | PRN

## 2013-07-23 MED ORDER — DILTIAZEM 12 MG/ML ORAL SUSPENSION: 60.0000 mg | Freq: Four times a day (QID) | ORAL | Status: DC

## 2013-07-23 MED ORDER — FLUCONAZOLE 200 MG PO TABS: 200.0000 mg | ORAL_TABLET | Freq: Every day | ORAL | Status: DC

## 2013-07-23 MED ORDER — ENSURE COMPLETE PO LIQD: 237.0000 mL | Freq: Three times a day (TID) | ORAL | Status: DC

## 2013-07-23 MED ORDER — ONDANSETRON HCL 4 MG/2ML IJ SOLN
4.0000 mg | Freq: Four times a day (QID) | INTRAMUSCULAR | Status: DC | PRN
  Administered 2013-07-23 – 2013-07-24 (×3): 4 mg via INTRAVENOUS
  Filled 2013-07-23 (×3): qty 2

## 2013-07-23 MED ORDER — FUROSEMIDE 10 MG/ML IJ SOLN
40.0000 mg | Freq: Two times a day (BID) | INTRAMUSCULAR | Status: DC
Start: 2013-07-23 — End: 2013-07-23

## 2013-07-23 MED ORDER — MAGIC MOUTHWASH W/LIDOCAINE: 5.0000 mL | Freq: Three times a day (TID) | ORAL | Status: DC | PRN

## 2013-07-23 NOTE — Progress Notes (Signed)
I have seen and examined the patient and agree with the assessment and plans. Because of her effusions and decreased 02 sats, I think she needs diuresis and will need to stay for 24 more hours with repeat films tomorrow  Nathaneil Canary A. Ninfa Linden  MD, FACS

## 2013-07-23 NOTE — Progress Notes (Signed)
Patient ID: Taylor [name], female   DOB: 05/10/1946, 68 y.o.   MRN: [id number]  Subjective: No complaints, denies sob, cp.  Clear productive cough.   Objective:  Vital signs:  Filed Vitals:   07/22/13 2101 07/22/13 2105 07/23/13 0500 07/23/13 0502  BP: 114/57   112/51  Pulse: 104 90  105  Temp: 98.6 F (37 C)   99.1 F (37.3 C)  TempSrc: Oral   Oral  Resp: 16 15  16   Height:      Weight:   120 lb (54.432 kg)   SpO2: 100% 95%  97%    Last BM Date: 07/22/13  Intake/Output   Yesterday:  01/21 0701 - 01/22 0700 In: 140 [P.O.:120] Out: 25 [Drains:25] This shift:     Physical Exam: General appearance: alert, cooperative and no distress  Resp: coarse throughout   Cardio: s1s2 rrr, no murmurs gallops or rubs  GI: +bs, abdomen is soft appropriately tender. midline incison is c/d/i. g tube clamped. drain with minimal purulent output   Problem List:   Principal Problem:   Perforated ulcer Active Problems:   TOBACCO USE   Difficult airway for intubation   AKI (acute kidney injury)   Protein-calorie malnutrition, moderate   Acute blood loss anemia   S/P exploratory laparotomy    Results:   Labs: Results for orders placed during the hospital encounter of 07/04/13 (from the past 48 hour(s))  GLUCOSE, CAPILLARY     Status: Abnormal   Collection Time    07/21/13  7:34 AM      Result Value Range   Glucose-Capillary 120 (*) 70 - 99 mg/dL  GLUCOSE, CAPILLARY     Status: Abnormal   Collection Time    07/21/13 12:04 PM      Result Value Range   Glucose-Capillary 124 (*) 70 - 99 mg/dL  GLUCOSE, CAPILLARY     Status: None   Collection Time    07/21/13  4:37 PM      Result Value Range   Glucose-Capillary 76  70 - 99 mg/dL  GLUCOSE, CAPILLARY     Status: Abnormal   Collection Time    07/21/13  7:42 PM      Result Value Range   Glucose-Capillary 104 (*) 70 - 99 mg/dL  GLUCOSE, CAPILLARY     Status: Abnormal   Collection Time    07/21/13 11:55 PM      Result  Value Range   Glucose-Capillary 100 (*) 70 - 99 mg/dL  GLUCOSE, CAPILLARY     Status: Abnormal   Collection Time    07/22/13  3:38 AM      Result Value Range   Glucose-Capillary 116 (*) 70 - 99 mg/dL  GLUCOSE, CAPILLARY     Status: None   Collection Time    07/22/13  8:18 AM      Result Value Range   Glucose-Capillary 94  70 - 99 mg/dL  GLUCOSE, CAPILLARY     Status: Abnormal   Collection Time    07/22/13 12:02 PM      Result Value Range   Glucose-Capillary 104 (*) 70 - 99 mg/dL   Comment 1 Documented in Chart     Comment 2 Notify RN    GLUCOSE, CAPILLARY     Status: None   Collection Time    07/22/13  3:33 PM      Result Value Range   Glucose-Capillary 87  70 - 99 mg/dL  GLUCOSE, CAPILLARY     Status:  None   Collection Time    07/22/13  7:45 PM      Result Value Range   Glucose-Capillary 81  70 - 99 mg/dL  GLUCOSE, CAPILLARY     Status: Abnormal   Collection Time    07/22/13 11:43 PM      Result Value Range   Glucose-Capillary 122 (*) 70 - 99 mg/dL  CBC     Status: Abnormal   Collection Time    07/23/13  3:28 AM      Result Value Range   WBC 18.6 (*) 4.0 - 10.5 K/uL   RBC 2.72 (*) 3.87 - 5.11 MIL/uL   Hemoglobin 7.6 (*) 12.0 - 15.0 g/dL   HCT 22.7 (*) 36.0 - 46.0 %   MCV 83.5  78.0 - 100.0 fL   MCH 27.9  26.0 - 34.0 pg   MCHC 33.5  30.0 - 36.0 g/dL   RDW 17.2 (*) 11.5 - 15.5 %   Platelets 1256 (*) 150 - 400 K/uL   Comment: REPEATED TO VERIFY     CRITICAL VALUE NOTED.  VALUE IS CONSISTENT WITH PREVIOUSLY REPORTED AND CALLED VALUE.  GLUCOSE, CAPILLARY     Status: None   Collection Time    07/23/13  3:37 AM      Result Value Range   Glucose-Capillary 98  70 - 99 mg/dL    Imaging / Studies: Ct Abdomen Pelvis W Contrast  07/21/2013   CLINICAL DATA:  History of abdominal abscess.  EXAM: CT ABDOMEN AND PELVIS WITH CONTRAST  TECHNIQUE: Multidetector CT imaging of the abdomen and pelvis was performed using the standard protocol following bolus administration of  intravenous contrast.  CONTRAST:  63mL OMNIPAQUE IOHEXOL 300 MG/ML  SOLN  COMPARISON:  07/15/2013  FINDINGS: Bilateral pleural effusions are again identified left greater than right. There is overlying compressive type consolidation and atelectasis.  No focal liver abnormality identified. The gallbladder appears normal. No biliary dilatation. Normal appearance of the pancreas. The left upper quadrant subphrenic abscess which has mass effect upon the spleen is again noted. This measures 6.7 x 2.5 cm, image 64/ coronal series. On the previous examination this measured 7.3 x 2.1 cm. Calcified granulomas within the splenic parenchyma are again noted.  Normal appearance of the adrenal glands. The kidneys are both on unremarkable. Urinary bladder appears normal. Partially calcified fibroid uterus is again noted.  There is calcified atherosclerotic disease involving the abdominal aorta. No aneurysm identified. Multiple reactive lymph nodes are noted in the upper abdomen. No significant adenopathy. No pelvic or inguinal adenopathy noted. There is a left upper quadrant pigtail drainage catheter identified. The associated a bilobed fluid collection is again noted. This measures 5.9 x 2.7 cm, image 41/series 2. Previously this measured 7.2 x 3.5 cm. Adjacent fluid collection posterior to the stomach measures 3 x 1.2 cm, image 40/series 2. Previously this measured 3 x 1.4 cm. Foci of extraluminal gas within the left abdomen appear associated with small bowel loops may indicate presence of fistula or sinus tract with the adjacent colon, image 50/series 2.  Gastrostomy tube is identified is identified within the stomach. The small bowel loops have a normal caliber. There is no evidence for bowel obstruction. Enteric contrast material reaches the colon and rectum.  Review of the visualized osseous structures is significant for mild multilevel spondylosis. 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  IMPRESSION: 1.  Mild decrease in size of small bowel mesenteric abscess the pigtail drainage catheter remains in place. 2.  No significant change in subphrenic abscess which has mass effect upon the spleen. 3. Again noted are foci of extraluminal gas within the left abdomen which may indicate the presence of a sinus tract or fistula between small enlarged bowel loops. No extravasation of contrast material is identified. 4. Bilateral pleural effusions.   Electronically Signed   By: Kerby Moors M.D.   On: 07/21/2013 10:07    Medications / Allergies: per chart  Antibiotics: Anti-infectives   Start     Dose/Rate Route Frequency Ordered Stop   07/23/13 0000  ciprofloxacin (CIPRO) 500 MG tablet     500 mg Oral 2 times daily 07/23/13 0724     07/23/13 0000  fluconazole (DIFLUCAN) 200 MG tablet     200 mg Oral Daily 07/23/13 0724     07/23/13 0000  metroNIDAZOLE (FLAGYL) 500 MG tablet     500 mg Oral Every 6 hours 07/23/13 0724     07/22/13 0900  ciprofloxacin (CIPRO) tablet 500 mg     500 mg Oral 2 times daily 07/22/13 0757     07/22/13 0845  metroNIDAZOLE (FLAGYL) tablet 500 mg     500 mg Oral 4 times per day 07/22/13 0757     07/18/13 1000  fluconazole (DIFLUCAN) tablet 200 mg     200 mg Oral Daily 07/17/13 0940     07/15/13 1400  ertapenem (INVANZ) 1 g in sodium chloride 0.9 % 50 mL IVPB  Status:  Discontinued     1 g 100 mL/hr over 30 Minutes Intravenous Every 24 hours 07/15/13 1311 07/22/13 0757   07/14/13 1045  amoxicillin-clavulanate (AUGMENTIN) 875-125 MG per tablet 1 tablet  Status:  Discontinued     1 tablet Oral Every 12 hours 07/14/13 1033 07/15/13 1311   07/07/13 0815  fluconazole (DIFLUCAN) IVPB 200 mg  Status:  Discontinued     200 mg 100 mL/hr over 60 Minutes Intravenous Every 24 hours 07/06/13 1030 07/17/13 0940   07/05/13 0815  fluconazole (DIFLUCAN) IVPB 400 mg  Status:  Discontinued     400 mg 100 mL/hr over 120 Minutes Intravenous Every 24 hours 07/05/13 0800 07/06/13 1030   07/05/13  0800  piperacillin-tazobactam (ZOSYN) IVPB 3.375 g  Status:  Discontinued     3.375 g 12.5 mL/hr over 240 Minutes Intravenous 3 times per day 07/05/13 0742 07/14/13 1033   07/05/13 0230  piperacillin-tazobactam (ZOSYN) IVPB 3.375 g     3.375 g 100 mL/hr over 30 Minutes Intravenous  Once 07/05/13 0229 07/05/13 0344   07/05/13 0230  metroNIDAZOLE (FLAGYL) IVPB 500 mg     500 mg 100 mL/hr over 60 Minutes Intravenous  Once 07/05/13 0229 07/05/13 0444      Assessment/Plan Perforated jejunal ulcer  POD #17 s/p Exploratory laparotomy, insertion of gastric tube, repair of jejunal ulcer perforation(Dr. Donne Hazel 07/05/13)  Intra-abdominal abscess-POD #6 s/p perc drain-84ml/24h output recorded  -cultures show klebsiella pneumoniae sensitive to imipenem  - Invanz D#8,   -per IR "subphrenic collection stable- attempt at drainage of this would be difficult due to proximity to diaphragm and could potentially create fistula to pleural space"  -will change to cipro flagyl started 1/21 -repeat CT in 1 week  Leukocytosis -repeat CT shows a decrease in abscess, abscess to LUQ  Thrombocytosis -ASA  Atelectasis-lungs are course, oxygen saturation is stable.  Repeat cxr to ensure no development in PNA.  i suspect it is more from atelectasis.  i stressed the importance of pulmonary toilet.  Respiratory failure -  s/p laryngeal cancer, chemoradiation - resolved  PCM/Hypoalbuminemia-ensure TID per G tube, resource breeze BID, MVI  Hypokalemia-continue supplement.  Will need repeat labs at follow up.  She will need to establish with pcp to monitor this and her afib.  Afib with RVR-rate controlled, diltiazem po ABL anemia-h&h are stable  Hypothyroidism-home meds  Hx ETOH abuse-MVI  Dispo-anticipate discharge home today  Erby Pian, [facility] Pager [phone] Office [phone]  07/23/2013 7:37 AM

## 2013-07-23 NOTE — Progress Notes (Signed)
Bilateral pleural effusions-start IV lasix, repeat CXR in AM AF-po cardizem, EF 30-35%, outpatient cards follow up.  Justyce Baby, ANP-BC

## 2013-07-23 NOTE — Progress Notes (Signed)
Occupational Therapy Treatment Patient Details Name: ROYA GIESELMAN MRN: [id number] DOB: 12/15/1945 Today's Date: 07/23/2013 Time: 4259-5638 OT Time Calculation (min): 23 min  OT Assessment / Plan / Recommendation  History of present illness 68 yo admitted 1/3 with perforated jejunum requiring emergent exploratory laparotomy with gastrostomy tube placed.  Pt intubated 1/3- 07/09/13.  Pt with h/o ETOH abuse and tobacco use   OT comments  Pt demonstrates incr activity tolerance compared to previous session however pt with cognitive deficits noted this session. Pt with poor STM and asking repetitive questions. Pt needing redirection to task.   Follow Up Recommendations  Home health OT;Supervision/Assistance - 24 hour    Barriers to Discharge       Equipment Recommendations  Other (comment) (wedge for bed)    Recommendations for Other Services    Frequency Min 2X/week   Progress towards OT Goals Progress towards OT goals: Progressing toward goals  Plan Discharge plan remains appropriate    Precautions / Restrictions Precautions Precautions: Fall Precaution Comments: multiple lines including abd JP drain & gastrostomy   Pertinent Vitals/Pain Abdominal discomfort    ADL  Grooming: Wash/dry hands;Minimal assistance Where Assessed - Grooming: Supported standing Toilet Transfer: Moderate assistance Toilet Transfer Method: Sit to stand Toilet Transfer Equipment: Regular height toilet;Grab bars Toileting - Clothing Manipulation and Hygiene: Minimal assistance Where Assessed - Toileting Clothing Manipulation and Hygiene: Sit to stand from 3-in-1 or toilet Equipment Used: Rolling walker Transfers/Ambulation Related to ADLs: PT requires v/c for hand placement and safe use of RW. Pt unable to recall instructions from sitting to standing position demonstrating cognitive deficits ADL Comments: Pt combing hair in supine. Pt agreeable to OOB. pt provided two choices and choosing preference.  pt unable to recall personal choice and asking therapist "what are we going to do?" Pt ambulating to doorway , washing hands at sink,. requesting to void bowels. pt sitting on toilet and provided instructions to call therapist before standing.Pt no recall and attempting to static standing alone. pt abandoning RW throughout session. patient and partner educated on wedge for sleeping and head elevation on a normal mattress at home. Also advised to call local fire department and notifiy fire department that patient is unable to exit 3 story apartment in case of emergency. Pt will d/c home by ambulance.     OT Diagnosis:    OT Problem List:   OT Treatment Interventions:     OT Goals(current goals can now be found in the care plan section) Acute Rehab OT Goals Patient Stated Goal: To get better and go home OT Goal Formulation: With patient Time For Goal Achievement: 08/03/13 Potential to Achieve Goals: Good ADL Goals Pt Will Perform Grooming: with min guard assist;sitting Pt Will Perform Upper Body Bathing: with min guard assist;sitting Pt Will Perform Lower Body Bathing: with min assist;sitting/lateral leans Pt Will Perform Upper Body Dressing: with min guard assist;sitting Pt Will Perform Lower Body Dressing: with min assist;with adaptive equipment;sitting/lateral leans Pt Will Transfer to Toilet: with min guard assist;ambulating;bedside commode Pt Will Perform Toileting - Clothing Manipulation and hygiene: with min assist;sitting/lateral leans Pt Will Perform Tub/Shower Transfer: with min assist;shower seat;rolling walker  Visit Information  Last OT Received On: 07/23/13 Assistance Needed: +1 History of Present Illness: 68 yo admitted 1/3 with perforated jejunum requiring emergent exploratory laparotomy with gastrostomy tube placed.  Pt intubated 1/3- 07/09/13.  Pt with h/o ETOH abuse and tobacco use    Subjective Data      Prior Functioning  Cognition   Cognition Arousal/Alertness: Awake/alert Behavior During Therapy: WFL for tasks assessed/performed Overall Cognitive Status: Impaired/Different from baseline Area of Impairment: Memory;Following commands;Safety/judgement;Awareness;Problem solving Memory: Decreased short-term memory Following Commands: Follows one step commands inconsistently;Follows one step commands with increased time Safety/Judgement: Decreased awareness of safety;Decreased awareness of deficits Awareness: Anticipatory Problem Solving: Slow processing;Difficulty sequencing    Mobility  Bed Mobility Overal bed mobility: Needs Assistance Bed Mobility: Supine to Sit Supine to sit: Min assist General bed mobility comments: bed elevation and (A) with trunk cues for sequencing task Transfers Overall transfer level: Needs assistance Equipment used: Rolling walker (2 wheeled) Transfers: Sit to/from Stand Sit to Stand: Min assist General transfer comment: cues for safety with RW and hand placement    Exercises      Balance    End of Session OT - End of Session Activity Tolerance: Patient limited by fatigue Patient left: in bed;with call bell/phone within reach;with family/visitor present;with bed alarm set Nurse Communication: Mobility status;Precautions  GO     Peri Maris 07/23/2013, 2:14 PM Pager: [phone]

## 2013-07-23 NOTE — Progress Notes (Signed)
PT Cancellation Note  Patient Details Name: Taylor [name] MRN: [id number] DOB: [birth year]   Cancelled Treatment:    Reason Eval/Treat Not Completed: Fatigue/lethargy limiting ability to participate. Holding due to OT seeing patient earlier and patient fatigued. Will follow up tomorrow prior to [place], Tonia Brooms 07/23/2013, 3:05 PM

## 2013-07-23 NOTE — Progress Notes (Signed)
Sputum sample sent to lab earlier today which the lab declined, will get another specimen when able.

## 2013-07-24 ENCOUNTER — Inpatient Hospital Stay (HOSPITAL_COMMUNITY): Payer: Medicare Other

## 2013-07-24 DIAGNOSIS — D75839 Thrombocytosis, unspecified: Secondary | ICD-10-CM | POA: Diagnosis present

## 2013-07-24 DIAGNOSIS — J90 Pleural effusion, not elsewhere classified: Secondary | ICD-10-CM | POA: Diagnosis not present

## 2013-07-24 DIAGNOSIS — D473 Essential (hemorrhagic) thrombocythemia: Secondary | ICD-10-CM | POA: Diagnosis present

## 2013-07-24 DIAGNOSIS — I429 Cardiomyopathy, unspecified: Secondary | ICD-10-CM | POA: Diagnosis present

## 2013-07-24 LAB — HEPATIC FUNCTION PANEL
ALBUMIN: 1.5 g/dL — AB (ref 3.5–5.2)
ALT: 7 U/L (ref 0–35)
AST: 15 U/L (ref 0–37)
Alkaline Phosphatase: 141 U/L — ABNORMAL HIGH (ref 39–117)
Total Protein: 5.8 g/dL — ABNORMAL LOW (ref 6.0–8.3)

## 2013-07-24 LAB — CBC
HEMATOCRIT: 23.1 % — AB (ref 36.0–46.0)
Hemoglobin: 7.8 g/dL — ABNORMAL LOW (ref 12.0–15.0)
MCH: 27.5 pg (ref 26.0–34.0)
MCHC: 33.8 g/dL (ref 30.0–36.0)
MCV: 81.3 fL (ref 78.0–100.0)
Platelets: 1263 10*3/uL (ref 150–400)
RBC: 2.84 MIL/uL — ABNORMAL LOW (ref 3.87–5.11)
RDW: 17 % — ABNORMAL HIGH (ref 11.5–15.5)
WBC: 20.4 10*3/uL — ABNORMAL HIGH (ref 4.0–10.5)

## 2013-07-24 LAB — GLUCOSE, CAPILLARY
GLUCOSE-CAPILLARY: 126 mg/dL — AB (ref 70–99)
GLUCOSE-CAPILLARY: 97 mg/dL (ref 70–99)
GLUCOSE-CAPILLARY: 109 mg/dL — AB (ref 70–99)
GLUCOSE-CAPILLARY: 127 mg/dL — AB (ref 70–99)

## 2013-07-24 LAB — BASIC METABOLIC PANEL
BUN: 9 mg/dL (ref 6–23)
CHLORIDE: 93 meq/L — AB (ref 96–112)
CO2: 26 mEq/L (ref 19–32)
Calcium: 7 mg/dL — ABNORMAL LOW (ref 8.4–10.5)
Creatinine, Ser: 0.62 mg/dL (ref 0.50–1.10)
GFR calc Af Amer: >90 mL/min (ref >90)
GFR calc non Af Amer: >90 mL/min (ref >90)
GLUCOSE: 98 mg/dL (ref 70–99)
Potassium: 4.2 mEq/L (ref 3.7–5.3)
Sodium: 133 mEq/L — ABNORMAL LOW (ref 137–147)

## 2013-07-24 LAB — C-REACTIVE PROTEIN: CRP: 25.2 mg/dL — ABNORMAL HIGH (ref <0.60)

## 2013-07-24 LAB — IRON AND TIBC
Iron: <10 ug/dL — ABNORMAL LOW (ref 42–135)
UIBC: 126 ug/dL (ref 125–400)

## 2013-07-24 LAB — BODY FLUID CELL COUNT WITH DIFFERENTIAL
Eos, Fluid: 0 %
Lymphs, Fluid: 2 %
Monocyte-Macrophage-Serous Fluid: 24 % — ABNORMAL LOW (ref 50–90)
Neutrophil Count, Fluid: 74 % — ABNORMAL HIGH (ref 0–25)
WBC FLUID: 8856 uL — AB (ref 0–1000)

## 2013-07-24 LAB — FERRITIN: Ferritin: 358 ng/mL — ABNORMAL HIGH (ref 10–291)

## 2013-07-24 LAB — SEDIMENTATION RATE: Sed Rate: 93 mm/hr — ABNORMAL HIGH (ref 0–22)

## 2013-07-24 LAB — PROTEIN, BODY FLUID: TOTAL PROTEIN, FLUID: 2.7 g/dL

## 2013-07-24 LAB — FIBRINOGEN: FIBRINOGEN: 850 mg/dL — AB (ref 204–475)

## 2013-07-24 LAB — TSH: TSH: 33.427 u[IU]/mL — ABNORMAL HIGH (ref 0.350–4.500)

## 2013-07-24 MED ORDER — VANCOMYCIN HCL 500 MG IV SOLR
500.0000 mg | Freq: Two times a day (BID) | INTRAVENOUS | Status: DC
  Administered 2013-07-25 – 2013-07-27 (×5): 500 mg via INTRAVENOUS
  Filled 2013-07-24 (×8): qty 500

## 2013-07-24 MED ORDER — LOSARTAN POTASSIUM 25 MG PO TABS
25.0000 mg | ORAL_TABLET | Freq: Every day | ORAL | Status: DC
  Administered 2013-07-24 – 2013-07-27 (×4): 25 mg via ORAL
  Filled 2013-07-24 (×4): qty 1

## 2013-07-24 MED ORDER — SODIUM CHLORIDE 0.9 % IV SOLN
1.0000 g | INTRAVENOUS | Status: DC
  Administered 2013-07-24 – 2013-07-26 (×3): 1 g via INTRAVENOUS
  Filled 2013-07-24 (×4): qty 1

## 2013-07-24 MED ORDER — SODIUM CHLORIDE 0.9 % IV SOLN
500.0000 mg | Freq: Three times a day (TID) | INTRAVENOUS | Status: DC
  Filled 2013-07-24 (×2): qty 500

## 2013-07-24 MED ORDER — VANCOMYCIN HCL IN DEXTROSE 1-5 GM/200ML-% IV SOLN
1000.0000 mg | INTRAVENOUS | Status: AC
  Administered 2013-07-24: 1000 mg via INTRAVENOUS
  Filled 2013-07-24: qty 200

## 2013-07-24 NOTE — Procedures (Signed)
Successful US guided left thoracentesis. Yielded 522ml of cloudy serous fluid. Pt tolerated procedure well. No immediate complications.  Specimen was sent for labs. CXR ordered.  Tsosie Billing D PA-C 07/24/2013 4:09 PM

## 2013-07-24 NOTE — Consult Note (Addendum)
WOC wound follow up Wound type: Gtube peristomal denudation from leakage.  Met with caregiver in the hallway outside the room and she thanked me for recommendation of the barrier cream.  New tube of Calmoseptine ordered and provided to patient/caregiver. She reports leakage is much better now and skin is much improved.  Will continue to use Calmoseptine PRN for skin irritation.    Re consult if needed, will not follow at this time. Thanks  Gianfranco Araki [place], [place] [phone])

## 2013-07-24 NOTE — Progress Notes (Signed)
PT Cancellation Note  Patient Details Name: Taylor [name] MRN: [id number] DOB: Jul 28, 1945   Cancelled Treatment:    Reason Eval/Treat Not Completed: Patient at procedure or test/unavailable. Patient going for test. Will follow up as able.    Jacqualyn Posey 07/24/2013, 2:29 PM

## 2013-07-24 NOTE — Progress Notes (Signed)
ANTIBIOTIC CONSULT NOTE - INITIAL  Pharmacy Consult for vancomycin Indication: bilateral pneumonia  Allergies  Allergen Reactions  . Ace Inhibitors Cough  . Hydrocodone Itching  . Hydromorphone Itching  . Tylox [Oxycodone-Acetaminophen] Itching  . Sulfa Antibiotics Itching and Rash    Patient Measurements: Height: 5' 4.17" (163 cm) Weight: 114 lb 14.4 oz (52.118 kg) IBW/kg (Calculated) : 55.1  Vital Signs: Temp: 99.2 F (37.3 C) (01/23 1356) Temp src: Oral (01/23 1356) BP: 133/68 mmHg (01/23 1356) Pulse Rate: 105 (01/23 1356) Intake/Output from previous day: 01/22 0701 - 01/23 0700 In: 797 [P.O.:550] Out: 2119 [Urine:2100; Drains:18; Stool:1] Intake/Output from this shift: Total I/O In: 487 [P.O.:150; Other:337] Out: 760 [Urine:750; Drains:10]  Labs:  Recent Labs  07/23/13 0328 07/24/13 0306  WBC 18.6* 20.4*  HGB 7.6* 7.8*  PLT 1256* 1263*  CREATININE  --  0.62   Estimated Creatinine Clearance: 56.1 ml/min (by C-G formula based on Cr of 0.62). No results found for this basename: VANCOTROUGH, Corlis Leak, VANCORANDOM, GENTTROUGH, GENTPEAK, GENTRANDOM, TOBRATROUGH, TOBRAPEAK, TOBRARND, AMIKACINPEAK, AMIKACINTROU, AMIKACIN,  in the last 72 hours   Microbiology: Recent Results (from the past 720 hour(s))  MRSA PCR SCREENING     Status: None   Collection Time    07/05/13  7:29 AM      Result Value Range Status   MRSA by PCR NEGATIVE  NEGATIVE Final   Comment:            The GeneXpert MRSA Assay (FDA     approved for NASAL specimens     only), is one component of a     comprehensive MRSA colonization     surveillance program. It is not     intended to diagnose MRSA     infection nor to guide or     monitor treatment for     MRSA infections.  CLOSTRIDIUM DIFFICILE BY PCR     Status: None   Collection Time    07/10/13  9:00 AM      Result Value Range Status   C difficile by pcr NEGATIVE  NEGATIVE Final  CULTURE, ROUTINE-ABSCESS     Status: None    Collection Time    07/16/13  3:32 PM      Result Value Range Status   Specimen Description ABSCESS PERITONEAL CAVITY   Final   Special Requests NONE   Final   Gram Stain     Final   Value: FEW WBC PRESENT,BOTH PMN AND MONONUCLEAR     NO SQUAMOUS EPITHELIAL CELLS SEEN     NO ORGANISMS SEEN     Performed at [facility]   Culture     Final   Value: FEW KLEBSIELLA PNEUMONIAE     Performed at [facility]   Report Status 07/20/2013 FINAL   Final   Organism ID, Bacteria KLEBSIELLA PNEUMONIAE   Final  CULTURE, EXPECTORATED SPUTUM-ASSESSMENT     Status: None   Collection Time    07/23/13 10:22 AM      Result Value Range Status   Specimen Description SPUTUM   Final   Special Requests Normal   Final   Sputum evaluation     Final   Value: MICROSCOPIC FINDINGS SUGGEST THAT THIS SPECIMEN IS NOT REPRESENTATIVE OF LOWER RESPIRATORY SECRETIONS. PLEASE RECOLLECT.     CALLED TO V CAIN,RN 07/23/13 1150 BY K SCHULTZ   Report Status 07/23/2013 FINAL   Final    Medical History: Past Medical History  Diagnosis Date  .  Hyperlipidemia     STATES SHE NO LONGER HAS HI CHOLESTEROL  . Rhinitis   . Fatigue 12/10/2011  . Hypothyroid 12/11/2011  . Hypertension     hx of, no current med for last 3 years  . Throat cancer 2010  . Breast CA 2008    right breast  . Headache(784.0)     tension  . Nausea alone 04/01/2013  . Difficult airway     Assessment: 68 y/o female admitted for perforated jejunal ulcer s/p ex lap, insertion of gastric tube, and repair of jejunal ulcer perforation. She finished a course of antibiotics for a Klebsiella intrabdominal abscess. Pharmacy now consulted to begin vancomycin for HCAP coverage. Patient is also on ertapenem. Renal function is normal and stable. WBC are elevated and Tmax is 99.2.  Goal of Therapy:  Vancomycin trough level 15-20 mcg/ml  Plan:  -Vancomycin 1000 mg IV x1 dose then 500 mg IV q12h -Monitor renal function, clinical course, and  cultures  [facility], [place].D., BCPS Clinical Pharmacist Pager: [phone] 07/24/2013 3:35 PM

## 2013-07-24 NOTE — Progress Notes (Signed)
I have seen and examined the patient and agree with the assessment and plans.  Ameira Alessandrini A. Zi Sek  MD, FACS  

## 2013-07-24 NOTE — Progress Notes (Deleted)
h20 flush per GT

## 2013-07-24 NOTE — Progress Notes (Signed)
19 Days Post-Op  Subjective: On room air now, continues to have a cough.  White count up.  Afebrile.  Objective: Vital signs in last 24 hours: Temp:  [98.1 F (36.7 C)-98.9 F (37.2 C)] 98.1 F (36.7 C) (01/23 0530) Pulse Rate:  [90-101] 101 (01/23 0530) Resp:  [16] 16 (01/23 0530) BP: (100-110)/(51-67) 100/52 mmHg (01/23 0530) SpO2:  [90 %-100 %] 92 % (01/23 0753) Weight:  [114 lb 14.4 oz (52.118 kg)] 114 lb 14.4 oz (52.118 kg) (01/23 0554) Last BM Date: 07/23/13  Intake/Output from previous day: 01/22 0701 - 01/23 0700 In: 797 [P.O.:550] Out: 2119 [Urine:2100; Drains:18; Stool:1] Intake/Output this shift: Total I/O In: -  Out: 610 [Urine:600; Drains:10]  Physical Exam:  General appearance: alert, cooperative and no distress  Resp: coarse throughout  Cardio: s1s2 rrr, no murmurs gallops or rubs  GI: +bs, abdomen is soft appropriately tender. midline wound beefy red, granulation tissue, fibrinous exudate. g tube clamped. drain with minimal purulent output    Lab Results:   Recent Labs  07/23/13 0328 07/24/13 0306  WBC 18.6* 20.4*  HGB 7.6* 7.8*  HCT 22.7* 23.1*  PLT 1256* 1263*   BMET  Recent Labs  07/24/13 0306  NA 133*  K 4.2  CL 93*  CO2 26  GLUCOSE 98  BUN 9  CREATININE 0.62  CALCIUM 7.0*   PT/INR No results found for this basename: LABPROT, INR,  in the last 72 hours ABG No results found for this basename: PHART, PCO2, PO2, HCO3,  in the last 72 hours  Studies/Results: Dg Chest 2 View  07/24/2013   CLINICAL DATA:  Cough and congestion with known pleural effusions  EXAM: CHEST  2 VIEW  COMPARISON:  DG CHEST 1V PORT dated 07/23/2013  FINDINGS: The lungs are adequately inflated. Coarse infrahilar lung markings bilaterally are present and may reflect atelectasis or pneumonia. Moderate-sized bilateral pleural effusions are present and may have increased in volume on the right. There is a stable calcified nodule in the left mid lung. The cardiac  silhouette is normal in size. The pulmonary vascularity is mildly prominent centrally but no definite cephalization is demonstrated. The mediastinum is normal in width. There surgical clips in the right axillary region.  IMPRESSION: 1. Moderate-sized bilateral pleural effusions are slightly more conspicuous today especially on the right. 2. Persistent bibasilar atelectasis or pneumonia is present. 3. There is no evidence of significant CHF.   Electronically Signed   By: David  Martinique   On: 07/24/2013 09:18   Dg Chest Port 1 View  07/23/2013   CLINICAL DATA:  Leukocytosis.  EXAM: PORTABLE CHEST - 1 VIEW  COMPARISON:  07/15/2013 and 05/16/2011  FINDINGS: Examination demonstrates worsening bibasilar opacification likely combination of airspace disease with small effusions left greater than right. Dense 6 mm nodule over the left midlung unchanged from 2012. There is mild cardiomegaly. Remainder the exam is unchanged.  IMPRESSION: Worsening bibasilar opacification likely infection. Worsening small bilateral effusions left worse than right.  Mild cardiomegaly.   Electronically Signed   By: Marin Olp M.D.   On: 07/23/2013 08:05    Anti-infectives: Anti-infectives   Start     Dose/Rate Route Frequency Ordered Stop   07/24/13 1400  imipenem-cilastatin (PRIMAXIN) 500 mg in sodium chloride 0.9 % 100 mL IVPB     500 mg 200 mL/hr over 30 Minutes Intravenous 3 times per day 07/24/13 1110     07/23/13 1030  piperacillin-tazobactam (ZOSYN) IVPB 3.375 g  Status:  Discontinued  3.375 g 12.5 mL/hr over 240 Minutes Intravenous 3 times per day 07/23/13 1024 07/23/13 1043   07/23/13 0000  ciprofloxacin (CIPRO) 500 MG tablet     500 mg Oral 2 times daily 07/23/13 0724     07/23/13 0000  fluconazole (DIFLUCAN) 200 MG tablet     200 mg Oral Daily 07/23/13 0724     07/23/13 0000  metroNIDAZOLE (FLAGYL) 500 MG tablet     500 mg Oral Every 6 hours 07/23/13 0724     07/22/13 0900  ciprofloxacin (CIPRO) tablet 500 mg   Status:  Discontinued     500 mg Oral 2 times daily 07/22/13 0757 07/24/13 1110   07/22/13 0845  metroNIDAZOLE (FLAGYL) tablet 500 mg  Status:  Discontinued     500 mg Oral 4 times per day 07/22/13 0757 07/24/13 1110   07/18/13 1000  fluconazole (DIFLUCAN) tablet 200 mg     200 mg Oral Daily 07/17/13 0940     07/15/13 1400  ertapenem (INVANZ) 1 g in sodium chloride 0.9 % 50 mL IVPB  Status:  Discontinued     1 g 100 mL/hr over 30 Minutes Intravenous Every 24 hours 07/15/13 1311 07/22/13 0757   07/14/13 1045  amoxicillin-clavulanate (AUGMENTIN) 875-125 MG per tablet 1 tablet  Status:  Discontinued     1 tablet Oral Every 12 hours 07/14/13 1033 07/15/13 1311   07/07/13 0815  fluconazole (DIFLUCAN) IVPB 200 mg  Status:  Discontinued     200 mg 100 mL/hr over 60 Minutes Intravenous Every 24 hours 07/06/13 1030 07/17/13 0940   07/05/13 0815  fluconazole (DIFLUCAN) IVPB 400 mg  Status:  Discontinued     400 mg 100 mL/hr over 120 Minutes Intravenous Every 24 hours 07/05/13 0800 07/06/13 1030   07/05/13 0800  piperacillin-tazobactam (ZOSYN) IVPB 3.375 g  Status:  Discontinued     3.375 g 12.5 mL/hr over 240 Minutes Intravenous 3 times per day 07/05/13 0742 07/14/13 1033   07/05/13 0230  piperacillin-tazobactam (ZOSYN) IVPB 3.375 g     3.375 g 100 mL/hr over 30 Minutes Intravenous  Once 07/05/13 0229 07/05/13 0344   07/05/13 0230  metroNIDAZOLE (FLAGYL) IVPB 500 mg     500 mg 100 mL/hr over 60 Minutes Intravenous  Once 07/05/13 0229 07/05/13 0444      Assessment/Plan: Perforated jejunal ulcer  POD #19 s/p Exploratory laparotomy, insertion of gastric tube, repair of jejunal ulcer perforation(Dr. Donne Hazel 07/05/13)  Intra-abdominal abscess-POD #8 s/p perc drain-50ml/24h output recorded  -cultures show klebsiella pneumoniae sensitive to imipenem  -per IR "subphrenic collection stable- attempt at drainage of this would be difficult due to proximity to diaphragm and could potentially create  fistula to pleural space"  -change back to IV atbx -repeat CT in 5-7 days  Leukocytosis -repeat CT shows a decrease in abscess, abscess to LUQ stable  Thrombocytosis -ASA  Atelectasis/pleural effusions-lungs are course, oxygen saturation is stable on room air now. Repeat cxr is worse.  Thoracentesis today, hold heparin.  Consult to IM for assistance.   Respiratory failure - s/p laryngeal cancer, chemoradiation PCM/Hypoalbuminemia-ensure TID per G tube, resource breeze BID, MVI  Hypokalemia-continue supplement.  Afib with RVR-rate controlled, diltiazem po.  Outpatient cardiology follow up ABL anemia-h&h are stable  Hypothyroidism-home meds  Hx ETOH abuse-MVI     LOS: 20 days    Oneida Arenas [facility] ANP-BC Pager [phone] 07/24/2013 11:19 AM

## 2013-07-24 NOTE — Consult Note (Addendum)
Requesting physician: Dr Coralie Keens, MD  Reason for consultation: Medical management and evaluate pleural effusions, leucocytosis and thrombocytosis  History of Present Illness: 68 year old female with history of breast cancer status post right mastectomy s/p chemotherapy and radiation, history of throat cancer, hypertension, hypothyroidism, protein calorie malnutrition, etoh abuse, anemia and HTN who was admitted on 07/05/2013 with acute abdominal pain with findings of perforated jejunal ulcer. She underwent emergent exploratory laparotomy, insertion of gastric tube and repair of the jejunal perforation. Postop she required intubation for several days. Patient developed an intra-abdominal abscess on postop day 6 requiring percutaneous drainage. The cultures grew klebsiella  pneumonia which was sensitive to imipenem. Patient was treated with a course of imipenem and switched to oral antibiotics. Hospital course complicated by development of bilateral pleural effusions, A. fib with RVR, increased leukocytosis and a left upper quadrant abscess. Patient resumed back on IV abx given bilateral pleural effusions and possible bilateral pneumonia on CXR, low-grade temperature and leukocytosis.  Hospitalists consulted to evaluate  her b/l effusions, pneumonia , persistent leucocytosis and thrombocytosis.  Patient on exam reports feeling fatigued and having some shortness of breath. She denies any headache, dizziness, blurred vision, nausea, vomiting, chest pain, palpitations, orthopnea or PND. She does have some pain over the surgical site. Denies any dysuria, diarrhea or joint pains. Denies any fever or chills.       Allergies:   Allergies  Allergen Reactions  . Ace Inhibitors Cough  . Hydrocodone Itching  . Hydromorphone Itching  . Tylox [Oxycodone-Acetaminophen] Itching  . Sulfa Antibiotics Itching and Rash      Past Medical History  Diagnosis Date  . Hyperlipidemia     STATES SHE NO  LONGER HAS HI CHOLESTEROL  . Rhinitis   . Fatigue 12/10/2011  . Hypothyroid 12/11/2011  . Hypertension     hx of, no current med for last 3 years  . Throat cancer 2010  . Breast CA 2008    right breast  . Headache(784.0)     tension  . Nausea alone 04/01/2013  . Difficult airway     Past Surgical History  Procedure Laterality Date  . Panendoscopy  05/16/2011    Procedure: PANENDOSCOPY;  Surgeon: Tyson Alias;  Location: [facility];  Service: ENT;  Laterality: N/A;  Direct laryngoscopy, esophagoscopy  . Radical neck dissection  05/16/2011    Procedure: RADICAL NECK DISSECTION;  Surgeon: Tyson Alias;  Location: [facility];  Service: ENT;  Laterality: Left;  Modified radical left neck dissection   . Appendectomy  yrs ago  . Cysts removed from back abd breast  yrs ago  . Breast lumpectomy Right 2008  . Esophagogastroduodenoscopy N/A 01/13/2013    Procedure: ESOPHAGOGASTRODUODENOSCOPY (EGD);  Surgeon: Cleotis Nipper, MD;  Location: [facility];  Service: Endoscopy;  Laterality: N/A;  . Balloon dilation N/A 01/13/2013    Procedure: BALLOON DILATION;  Surgeon: Cleotis Nipper, MD;  Location: [facility];  Service: Endoscopy;  Laterality: N/A;  . Laparotomy N/A 07/05/2013    Procedure: EXPLORATORY LAPAROTOMY, insertion of gastrostomy tube, repair jejunal ulcer perforation.;  Surgeon: Rolm Bookbinder, MD;  Location: [facility];  Service: General;  Laterality: N/A;    Medications:  Scheduled Meds: . aspirin  325 mg Oral Daily  . diltiazem  60 mg Per Tube Q6H  . ertapenem  1 g Intravenous Q24H  . feeding supplement (ENSURE COMPLETE)  237 mL Per Tube TID BM  . feeding supplement (RESOURCE BREEZE)  1 Container Oral BID WC  .  fluconazole  200 mg Oral Daily  . folic acid  1 mg Oral Daily  . furosemide  40 mg Intravenous BID  . heparin subcutaneous  5,000 Units Subcutaneous Q8H  . insulin aspart  0-15 Units Subcutaneous Q4H  . ipratropium-albuterol  3 mL Nebulization TID  . levothyroxine  50  mcg Oral QAC breakfast  . Menthol-Zinc Oxide  1 application Topical BID  . multivitamin  5 mL Oral Daily  . nystatin  5 mL Oral QID  . pantoprazole sodium  40 mg Per Tube BID  . potassium chloride  40 mEq Oral Daily  . thiamine  100 mg Oral Daily   Continuous Infusions:  PRN Meds:.benzocaine, dextrose, diphenhydrAMINE, fentaNYL, glucose-Vitamin C, guaiFENesin, HYDROcodone-acetaminophen, ipratropium-albuterol, LORazepam, magic mouthwash w/lidocaine, menthol-cetylpyridinium, ondansetron (ZOFRAN) IV, oxyCODONE-acetaminophen  Social History:  reports that she has been smoking Cigarettes.  She has a 80 pack-year smoking history. She has never used smokeless tobacco. She reports that she drinks alcohol. She reports that she does not use illicit drugs.  Family History  Problem Relation Age of Onset  . Hypertension    . Throat cancer    . Rheum arthritis    . Hypertension Mother   . Hyperlipidemia Mother   . Throat cancer Father     Review of Systems:  Constitutional: Denies fever, chills, diaphoresis, appetite change and fatigue.  HEENT: Denies photophobia,  ear pain, congestion, sore throat, rhinorrhea, sneezing, mouth sores, trouble swallowing, neck pain, neck stiffness and tinnitus.   Respiratory: SOB, DOE, cough, denies chest tightness,  and wheezing.   Cardiovascular: Denies chest pain, palpitations and leg swelling.  Gastrointestinal: Denies nausea, vomiting, abdominal pain, diarrhea, constipation, blood in stool and abdominal distention.  Genitourinary: Denies dysuria, urgency, frequency, hematuria, flank pain and difficulty urinating.  Endocrine: Denies  polyuria, polydipsia. Musculoskeletal: Denies myalgias, back pain, joint swelling, arthralgias and gait problem.  Skin: Denies pallor, rash and wound.  Neurological: Denies dizziness, seizures, syncope, weakness, light-headedness, numbness and headaches.  Psychiatric/Behavioral: Denies  mood changes, confusion, nervousness, sleep  disturbance and agitation   Physical Exam:  Filed Vitals:   07/24/13 0530 07/24/13 0554 07/24/13 0753 07/24/13 1356  BP: 100/52   133/68  Pulse: 101   105  Temp: 98.1 F (36.7 C)   99.2 F (37.3 C)  TempSrc: Oral   Oral  Resp: 16   17  Height:      Weight:  52.118 kg (114 lb 14.4 oz)    SpO2: 90%  92% 92%     Intake/Output Summary (Last 24 hours) at 07/24/13 1439 Last data filed at 07/24/13 1009  Gross per 24 hour  Intake    642 ml  Output   2073 ml  Net  -1431 ml    General: Elderly thin built female in NAD HEENT: pallor, moist oral mucosa, no JVD Heart: S1 and S2 tachycardic,, without murmurs, rubs, gallops. Lungs: Diminished breath sounds over bilateral lung, right>Left Abdomen: Soft, dressing over surgical site, G tube and drained in place Extremities: No clubbing cyanosis or edema with positive pedal pulses. Neuro: AAOX3,  nonfocal.  Labs on Admission:  CBC:    Component Value Date/Time   WBC 20.4* 07/24/2013 0306   WBC 7.7 04/01/2013 1135   HGB 7.8* 07/24/2013 0306   HGB 11.0* 04/01/2013 1135   HCT 23.1* 07/24/2013 0306   HCT 31.9* 04/01/2013 1135   PLT 1263* 07/24/2013 0306   PLT 579* 04/01/2013 1135   MCV 81.3 07/24/2013 0306   MCV 89.8 04/01/2013  1135   NEUTROABS 12.5* 07/12/2013 0410   NEUTROABS 5.3 04/01/2013 1135   LYMPHSABS 0.7 07/12/2013 0410   LYMPHSABS 0.8* 04/01/2013 1135   MONOABS 1.0 07/12/2013 0410   MONOABS 1.0* 04/01/2013 1135   EOSABS 0.4 07/12/2013 0410   EOSABS 0.4 04/01/2013 1135   BASOSABS 0.0 07/12/2013 0410   BASOSABS 0.1 04/01/2013 3818    Basic Metabolic Panel:    Component Value Date/Time   NA 133* 07/24/2013 0306   NA 133* 04/01/2013 1134   NA 136 03/14/2011 0954   K 4.2 07/24/2013 0306   K 4.2 04/01/2013 1134   K 5.5* 03/14/2011 0954   CL 93* 07/24/2013 0306   CL 96* 10/08/2012 0911   CL 96* 03/14/2011 0954   CO2 26 07/24/2013 0306   CO2 27 04/01/2013 1134   CO2 26 03/14/2011 0954   BUN 9 07/24/2013 0306   BUN 9.9 04/01/2013 1134   BUN 11  03/14/2011 0954   CREATININE 0.62 07/24/2013 0306   CREATININE 0.7 04/01/2013 1134   CREATININE 0.5* 03/14/2011 0954   GLUCOSE 98 07/24/2013 0306   GLUCOSE 106 04/01/2013 1134   GLUCOSE 112* 10/08/2012 0911   GLUCOSE 113 03/14/2011 0954   CALCIUM 7.0* 07/24/2013 0306   CALCIUM 9.5 04/01/2013 1134   CALCIUM 9.5 03/14/2011 0954    Radiological Exams on Admission: Dg Chest 2 View  07/24/2013   CLINICAL DATA:  Cough and congestion with known pleural effusions  EXAM: CHEST  2 VIEW  COMPARISON:  DG CHEST 1V PORT dated 07/23/2013  FINDINGS: The lungs are adequately inflated. Coarse infrahilar lung markings bilaterally are present and may reflect atelectasis or pneumonia. Moderate-sized bilateral pleural effusions are present and may have increased in volume on the right. There is a stable calcified nodule in the left mid lung. The cardiac silhouette is normal in size. The pulmonary vascularity is mildly prominent centrally but no definite cephalization is demonstrated. The mediastinum is normal in width. There surgical clips in the right axillary region.  IMPRESSION: 1. Moderate-sized bilateral pleural effusions are slightly more conspicuous today especially on the right. 2. Persistent bibasilar atelectasis or pneumonia is present. 3. There is no evidence of significant CHF.   Electronically Signed   By: David  Martinique   On: 07/24/2013 09:18   Dg Chest Port 1 View  07/23/2013   CLINICAL DATA:  Leukocytosis.  EXAM: PORTABLE CHEST - 1 VIEW  COMPARISON:  07/15/2013 and 05/16/2011  FINDINGS: Examination demonstrates worsening bibasilar opacification likely combination of airspace disease with small effusions left greater than right. Dense 6 mm nodule over the left midlung unchanged from 2012. There is mild cardiomegaly. Remainder the exam is unchanged.  IMPRESSION: Worsening bibasilar opacification likely infection. Worsening small bilateral effusions left worse than right.  Mild cardiomegaly.   Electronically Signed   By:  Marin Olp M.D.   On: 07/23/2013 08:05    Assessment/Plan  Bilateral pleural effusions/ penumonia This is new finding with possible bilateral pneumonia. Patient started back on IV Invanz . I will add empiric vancomycin repeated ordered blood culture. Patient plan for thoracentesis today. We'll follow up on cell count, protein and culture. Send cx for AFB and cytology. Monitor O2 sat. continue IV lasix Will check 2 D echo  Leukocytosis Possibly in the setting of underlying infection. Monitor closely. Order stool for C. difficile if having loose bowel movements.  Anemia with Thrombocytosis patient presented with platelets in 700s. .  platelet  1300s now. Likely reactive thrombocytosis which is multifactorial  due to underlying malignancy, infection/inflammation, iron deficiency anemia. Will order iron panel, ferritin, fibrinogen, ESR, TSH, CRP. transfuse PRBC as needed. -Continue aspirin.       Perforated jejunal ulcer Status post exploratory laparotomy, repair of radial digital ulcer with omental patch and gastrostomy tube placement. Continue Protonix  afib with RVR Appears to have developed during this hospitalization. Required Cardizem gtt, now rate on oral Cardizem. Will check TSH . Marland Kitchen Monitor on telemetry.  Cardiomyopathy Again this is new finding during this hospitalization. 2-D echo on 1/7 showing EF of 35-40% with akinesia. Appears likely to be ischemic vs ?etoh related.. Continue Lasix. I will add on ARB. ( Allergy to ACE inhibitor due to cough) -She will need cardiology evaluation at some point, perhaps outpatient.  Hypothyroidism Continue Synthroid. 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 Follows with Dr Alvy Bimler.  Severe protein calorie malnutrition.  On supplements   Diet: Dys level  3   DVT prophylaxis  Plan discussed with the patient. Thank you for allowing [place] to participate in patient's care. Hospitalist will continue to follow.  Time Spent on Admission: 70 minutes  Taylor [name] 07/24/2013, 2:39 PM

## 2013-07-25 ENCOUNTER — Inpatient Hospital Stay (HOSPITAL_COMMUNITY): Payer: Medicare Other

## 2013-07-25 LAB — GLUCOSE, CAPILLARY
GLUCOSE-CAPILLARY: 112 mg/dL — AB (ref 70–99)
GLUCOSE-CAPILLARY: 134 mg/dL — AB (ref 70–99)
Glucose-Capillary: 100 mg/dL — ABNORMAL HIGH (ref 70–99)
Glucose-Capillary: 110 mg/dL — ABNORMAL HIGH (ref 70–99)
Glucose-Capillary: 159 mg/dL — ABNORMAL HIGH (ref 70–99)

## 2013-07-25 LAB — CBC
HCT: 22.3 % — ABNORMAL LOW (ref 36.0–46.0)
Hemoglobin: 7.6 g/dL — ABNORMAL LOW (ref 12.0–15.0)
MCH: 27.4 pg (ref 26.0–34.0)
MCHC: 34.1 g/dL (ref 30.0–36.0)
MCV: 80.5 fL (ref 78.0–100.0)
PLATELETS: 1247 10*3/uL — AB (ref 150–400)
RBC: 2.77 MIL/uL — ABNORMAL LOW (ref 3.87–5.11)
RDW: 16.9 % — AB (ref 11.5–15.5)
WBC: 32.1 10*3/uL — ABNORMAL HIGH (ref 4.0–10.5)

## 2013-07-25 LAB — BLOOD GAS, ARTERIAL
Acid-Base Excess: 4.6 mmol/L — ABNORMAL HIGH (ref 0.0–2.0)
Bicarbonate: 28.9 mEq/L — ABNORMAL HIGH (ref 20.0–24.0)
DRAWN BY: 246861
O2 CONTENT: 2 L/min
O2 SAT: 74.4 %
PCO2 ART: 44.9 mmHg (ref 35.0–45.0)
Patient temperature: 98.6
TCO2: 30.3 mmol/L (ref 0–100)
pH, Arterial: 7.425 (ref 7.350–7.450)
pO2, Arterial: 43.1 mmHg — ABNORMAL LOW (ref 80.0–100.0)

## 2013-07-25 LAB — TECHNOLOGIST SMEAR REVIEW

## 2013-07-25 LAB — CLOSTRIDIUM DIFFICILE BY PCR: CDIFFPCR: NEGATIVE

## 2013-07-25 MED ORDER — OSMOLITE 1.2 CAL PO LIQD
1000.0000 mL | ORAL | Status: DC
Start: 2013-07-25 — End: 2013-07-27
  Administered 2013-07-25 – 2013-07-26 (×2): 1000 mL
  Filled 2013-07-25 (×6): qty 1000

## 2013-07-25 MED ORDER — HALOPERIDOL 0.5 MG PO TABS
0.5000 mg | ORAL_TABLET | Freq: Three times a day (TID) | ORAL | Status: DC
  Filled 2013-07-25 (×3): qty 1

## 2013-07-25 MED ORDER — FUROSEMIDE 10 MG/ML IJ SOLN: 80.0000 mg | Freq: Two times a day (BID) | INTRAMUSCULAR | Status: DC

## 2013-07-25 MED ORDER — BUSPIRONE HCL 5 MG PO TABS
5.0000 mg | ORAL_TABLET | Freq: Two times a day (BID) | ORAL | Status: DC
  Administered 2013-07-25 – 2013-07-27 (×3): 5 mg via ORAL
  Filled 2013-07-25 (×5): qty 1

## 2013-07-25 MED ORDER — AZITHROMYCIN 200 MG/5ML PO SUSR: 250.0000 mg | ORAL | Status: DC

## 2013-07-25 MED ORDER — HALOPERIDOL 0.5 MG PO TABS
0.5000 mg | ORAL_TABLET | Freq: Every evening | ORAL | Status: DC | PRN
  Administered 2013-07-26: 0.5 mg via ORAL
  Filled 2013-07-25 (×2): qty 1

## 2013-07-25 MED ORDER — ENSURE COMPLETE PO LIQD: 237.0000 mL | Freq: Two times a day (BID) | ORAL | Status: DC

## 2013-07-25 MED ORDER — IOHEXOL 300 MG/ML  SOLN
75.0000 mL | Freq: Once | INTRAMUSCULAR | Status: AC | PRN
  Administered 2013-07-25: 75 mL via INTRAVENOUS

## 2013-07-25 MED ORDER — AZITHROMYCIN 200 MG/5ML PO SUSR
500.0000 mg | Freq: Once | ORAL | Status: AC
  Administered 2013-07-25: 500 mg
  Filled 2013-07-25: qty 12.5

## 2013-07-25 MED ORDER — AZITHROMYCIN 200 MG/5ML PO SUSR
250.0000 mg | Freq: Every day | ORAL | Status: DC
  Administered 2013-07-26 – 2013-07-27 (×2): 250 mg
  Filled 2013-07-25 (×2): qty 10

## 2013-07-25 MED ORDER — FUROSEMIDE 10 MG/ML IJ SOLN
80.0000 mg | Freq: Two times a day (BID) | INTRAMUSCULAR | Status: DC
  Administered 2013-07-25 – 2013-07-27 (×4): 80 mg via INTRAVENOUS
  Filled 2013-07-25 (×5): qty 8

## 2013-07-25 MED ORDER — SODIUM CHLORIDE 0.9 % IV SOLN
INTRAVENOUS | Status: DC
Start: 2013-07-25 — End: 2013-07-27
  Administered 2013-07-25: 20 mL/h via INTRAVENOUS

## 2013-07-25 NOTE — Progress Notes (Signed)
Pt c/o labored breathing,confused after receiving Ativan 0.5 mg IV for anxiety, O2 sat 90 on 2L,,resp therapist paged to give respiratory tx, pt with very wet breath sounds per resp. Md on call paged with new order.

## 2013-07-25 NOTE — Progress Notes (Addendum)
NUTRITION FOLLOW UP  Intervention:   Initiate TF via G-tube with Osmolite 1.2 at 15 ml/h, increase by 10 ml every 4 hours to goal rate of 85 ml/h from 6 PM to 6 AM to provide 1224 kcals (72% of minimum estimated needs), 57 gm protein (67% of estimated needs), 836 ml free water daily. If patient does not eat Lunch or Dinner, give an Ensure.  Nutrition Dx:   Inadequate oral intake related to poor appetite as evidenced by poor intake of meals, ongoing.  Goal:   Pt to meet >/= 90% of their estimated nutrition needs, currently unmet  Monitor:   TF tolerance/adequacy, PO intake, weight trend, labs.  Assessment:   Patient with PMH of throat & breast cancer who presented with 24 hour history of abdominal pain; 1/4 CT abdomen showed perforated viscus, most likely the thickened distal duodenum.   Patient s/p procedure 1/4:  EXPLORATORY LAPAROTOMY  PRIMARY REPAIR OF PERFORATED JEJUNAL INTERVAL WITH OMENTAL PATCH  GASTROSTOMY TUBE PLACEMENT  Patient remains on a Dysphagia 3 diet with thin liquids. Calorie count completed on 1/20 revealed that patient is consuming 11% of estimated calorie needs and 3% of estimated protein needs PO. Patient has been receiving Ensure Complete TID and Resource Breeze TID via G-tube per discussion with RN. Patient is consuming 0% of all meals. Noted Oncologist to meet with patient on Monday.  Patient would benefit from TF via g-tube to ensure adequate nutrition intake. Per discussion with Megan, PA, will go ahead and start TF via G-tube to meet ~70% of estimated nutrition needs.   Height: Ht Readings from Last 1 Encounters:  07/05/13 5' 4.17" (1.63 m)    Weight Status:   Wt Readings from Last 1 Encounters:  07/25/13 106 lb 8 oz (48.308 kg)    Re-estimated needs:  Kcal: 1700-1900 Protein: 85-95 gm Fluid: 1.7-1.9 L  Skin: surgical incisions (neck, abdomen); stage 1 to sacrum  Last BM: 1/24  Diet Order: Dysphagia 3 with thin liquids; Ensure Complete TID;  Resource Breeze TID.   Intake/Output Summary (Last 24 hours) at 07/25/13 1201 Last data filed at 07/25/13 0900  Gross per 24 hour  Intake   1535 ml  Output    735 ml  Net    800 ml    Labs:   Recent Labs Lab 07/19/13 0415 07/21/13 0418 07/24/13 0306  NA  --  133* 133*  K  --  3.9 4.2  CL  --  100 93*  CO2  --  21 26  BUN  --  4* 9  CREATININE 0.52 0.48* 0.62  CALCIUM  --  6.7* 7.0*  GLUCOSE  --  99 98    CBG (last 3)   Recent Labs  07/25/13 0004 07/25/13 0509 07/25/13 1150  GLUCAP 100* 110* 159*    Scheduled Meds: . aspirin  325 mg Oral Daily  . diltiazem  60 mg Per Tube Q6H  . ertapenem  1 g Intravenous Q24H  . feeding supplement (ENSURE COMPLETE)  237 mL Per Tube TID BM  . feeding supplement (RESOURCE BREEZE)  1 Container Oral BID WC  . fluconazole  200 mg Oral Daily  . folic acid  1 mg Oral Daily  . furosemide  40 mg Intravenous BID  . haloperidol  0.5 mg Oral TID  . heparin subcutaneous  5,000 Units Subcutaneous Q8H  . insulin aspart  0-15 Units Subcutaneous Q4H  . ipratropium-albuterol  3 mL Nebulization TID  . levothyroxine  50 mcg Oral  QAC breakfast  . losartan  25 mg Oral Daily  . Menthol-Zinc Oxide  1 application Topical BID  . multivitamin  5 mL Oral Daily  . nystatin  5 mL Oral QID  . pantoprazole sodium  40 mg Per Tube BID  . potassium chloride  40 mEq Oral Daily  . thiamine  100 mg Oral Daily  . vancomycin  500 mg Intravenous Q12H    Continuous Infusions: . sodium chloride 20 mL/hr (07/25/13 0942)    Molli Barrows, RD, LDN, [place] Pager [phone] After Hours Pager [phone]

## 2013-07-25 NOTE — Progress Notes (Signed)
I have seen and examined the patient and agree with the assessment and plans. This is a very difficult situation Awaiting results of thoracentesis. Will have oncology see her Monday at the family request Continue iv antibiotics  Kamariah Fruchter A. Ninfa Linden  MD, FACS

## 2013-07-25 NOTE — Progress Notes (Signed)
CONSULT NOTE  Note: This document was prepared with digital dictation and possible smart phrase technology. Any transcriptional errors that result from this process are unintentional.   DEBORRAH MABIN [name]:[id number] DOB: July 08, 1945 DOA: 07/04/2013 PCP: Lottie Dawson, MD  Brief narrative: 68 y/o ?, known complex PMH including esophageal stricture followed by Dr. 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 Rx three cycles of Cytoxan, Taxotere and intolerant to all six planned cycles secondary to severe myalgia/arthralgia.  SHe is also hypothyroid, and has been non-compliant on her synthroid  Admitted on 07/05/2013 with acute abdominal pain with findings of perforated jejunal ulcer. S/p emergent exploratory laparotomy 1/4, Ulcer noted nxt to Lig of treitz c Bile spilling-placement 19 Fr Blake, insertion of gastric tube and repair of the jejunal perforation c Phillip Heal Patch Perf Jejunum  Postop she required intubation for several days-Hypotensive c shock-initially thought septic<<hypovolemic. Transfused Multiple x's Developed Afib ~07/10/13, transitioned to PO cardizem  Patient developed an intra-abdominal abscess 1/14 requiring IR percutaneous drainage on 07/16/13.  The cultures grew klebsiella pneumonia which was sensitive to imipenem. Patient was treated with a course of imipenem and switched to oral antibiotics.  Had to transition to IV ertapenem/Diflucan Hospital course complicated by development of bilateral pleural effusions, Hospitalist service consulted 1/23 for medical assistance Thoracocentesis performed 07/24/12=   Past medical history-As per Problem list Chart reviewed as below- See  above  Consultants:  CCM  IR  Procedures:  Multiple   Subjective  Tired. Looks unwell ancxious Not hungry   Objective    Interim History: None   Telemetry: Afib   Objective: Filed Vitals:   07/24/13 1555 07/24/13 2200 07/25/13 0500 07/25/13 1000  BP: 133/54 108/51 95/54   Pulse:  102 90   Temp:  98.8 F (37.1 C) 99.5 F (37.5 C)   TempSrc:  Oral Oral   Resp:  18 20   Height:      Weight:   48.308 kg (106 lb 8 oz)   SpO2:  94% 94% 94%    Intake/Output Summary (Last 24 hours) at 07/25/13 1200 Last data filed at 07/25/13 0900  Gross per 24 hour  Intake   1535 ml  Output    735 ml  Net    800 ml    Exam:  General: EOMI frail, somehwat SOb off o2 Cardiovascular:  s1 s2 no m/r/g Respiratory: diminished post lat Abdomen:  Soft, NT, ND Skinno Le edema.  MIdline abd scar covered c dressing Neuro intact but sleepy  Data Reviewed: Basic Metabolic Panel:  Recent Labs Lab 07/19/13 0415 07/21/13 0418 07/24/13 0306  NA  --  133* 133*  K  --  3.9 4.2  CL  --  100 93*  CO2  --  21 26  GLUCOSE  --  99 98  BUN  --  4* 9  CREATININE 0.52 0.48* 0.62  CALCIUM  --  6.7* 7.0*   Liver Function Tests:  Recent Labs Lab 07/24/13 1925  AST 15  ALT 7  ALKPHOS 141*  BILITOT <0.2*  PROT 5.8*  ALBUMIN 1.5*   No results found for this basename: LIPASE, AMYLASE,  in the last 168 hours No results found for this basename: AMMONIA,  in the last 168  hours CBC:  Recent Labs Lab 07/21/13 0418 07/23/13 0328 07/24/13 0306 07/25/13 0518  WBC 16.4* 18.6* 20.4* 32.1*  HGB 8.4* 7.6* 7.8* 7.6*  HCT 24.4* 22.7* 23.1* 22.3*  MCV 81.3 83.5 81.3 80.5  PLT 1079* 1256* 1263* 1247*   Cardiac Enzymes: No results found for this basename: CKTOTAL, CKMB, CKMBINDEX, TROPONINI,  in the last 168 hours BNP: No components found with this basename: POCBNP,  CBG:  Recent Labs Lab 07/24/13 1133 07/24/13 1951 07/25/13 0004 07/25/13 0509 07/25/13 1150  GLUCAP 109*  127* 100* 110* 159*    Recent Results (from the past 240 hour(s))  CULTURE, ROUTINE-ABSCESS     Status: None   Collection Time    07/16/13  3:32 PM      Result Value Range Status   Specimen Description ABSCESS PERITONEAL CAVITY   Final   Special Requests NONE   Final   Gram Stain     Final   Value: FEW WBC PRESENT,BOTH PMN AND MONONUCLEAR     NO SQUAMOUS EPITHELIAL CELLS SEEN     NO ORGANISMS SEEN     Performed at [facility]   Culture     Final   Value: FEW KLEBSIELLA PNEUMONIAE     Performed at [facility]   Report Status 07/20/2013 FINAL   Final   Organism ID, Bacteria KLEBSIELLA PNEUMONIAE   Final  CULTURE, EXPECTORATED SPUTUM-ASSESSMENT     Status: None   Collection Time    07/23/13 10:22 AM      Result Value Range Status   Specimen Description SPUTUM   Final   Special Requests Normal   Final   Sputum evaluation     Final   Value: MICROSCOPIC FINDINGS SUGGEST THAT THIS SPECIMEN IS NOT REPRESENTATIVE OF LOWER RESPIRATORY SECRETIONS. PLEASE RECOLLECT.     CALLED TO V CAIN,RN 07/23/13 1150 BY K SCHULTZ   Report Status 07/23/2013 FINAL   Final  BODY FLUID CULTURE     Status: None   Collection Time    07/24/13  3:57 PM      Result Value Range Status   Specimen Description PLEURAL FLUID LEFT   Final   Special Requests Normal   Final   Gram Stain     Final   Value: NO SQUAMOUS EPITHELIAL CELLS SEEN     MODERATE WBC PRESENT, PREDOMINANTLY PMN     NO ORGANISMS SEEN     Performed at [facility]   Culture PENDING   Incomplete   Report Status PENDING   Incomplete     Studies:              All Imaging reviewed and is as per above notation   Scheduled Meds: . aspirin  325 mg Oral Daily  . diltiazem  60 mg Per Tube Q6H  . ertapenem  1 g Intravenous Q24H  . feeding supplement (ENSURE COMPLETE)  237 mL Per Tube TID BM  . feeding supplement (RESOURCE BREEZE)  1 Container Oral BID WC  . fluconazole  200 mg Oral Daily  . folic acid  1 mg Oral Daily   . furosemide  40 mg Intravenous BID  . haloperidol  0.5 mg Oral TID  . heparin subcutaneous  5,000 Units Subcutaneous Q8H  . insulin aspart  0-15 Units Subcutaneous Q4H  . ipratropium-albuterol  3 mL Nebulization TID  . levothyroxine  50 mcg Oral QAC breakfast  . losartan  25 mg Oral Daily  . Menthol-Zinc Oxide  1  application Topical BID  . multivitamin  5 mL Oral Daily  . nystatin  5 mL Oral QID  . pantoprazole sodium  40 mg Per Tube BID  . potassium chloride  40 mEq Oral Daily  . thiamine  100 mg Oral Daily  . vancomycin  500 mg Intravenous Q12H   Continuous Infusions: . sodium chloride 20 mL/hr (07/25/13 0942)     Assessment/Plan:  Bilateral pleural effusions/ pnumonia  -Patient started back on IV Invanz . Continue vancomycin started 1/23   -Light's criterion is Transudative, but has 8,000 WBC's-Would Rx as infected Possible exudate -Blood cultures and Pleural fluid cultures can guide therapy--Pending from 1/23 -desats to 85% on RA-ABG requested shows Pa02 43.1 -continue IV lasix-Increased dose to 80 IV bid.  Net + 5.5 liters so far this admission -Rpt Echo scheduled-Last echo 1/7 EF 35% -CT chest ordered 1/25 am to further delineate effusions, given CA h/o -?h/o exposure to Pertussis?  Unclear if this is driving anything. Leukocytosis  -Possibly in the setting of underlying infection. Monitor closely. -Order stool for C. difficile if having loose bowel movements.  Anemia with Thrombocytosis  -patient presented with platelets in 700s. platelet 1300s now.  -Likely reactive thrombocytosis which is multifactorial due to underlying malignancy, infection/inflammation, iron deficiency anemia.  -Continue aspirin.  -Oncology has been consulted at my request to eval EcOG score -Hemological anomalies are 2/2 to sepsis physiology/ Oncology issues Perforated jejunal ulcer  -Status post exploratory laparotomy, repair of radial digital ulcer with omental patch and gastrostomy tube  placement.  -Continue Protonix  Afib with RVR   -Required Cardizem gtt, now rate on oral Cardizem. Will check TSH . Marland Kitchen Monitor on telemetry.  -Will need consideration of AC Cardiomyopathy  -Again this is new finding during this hospitalization.  -2-D echo on 1/7 showing EF of 35-40% with akinesia.  -Appears likely to be ischemic vs ?etoh related..  -Continue Lasix. I will add on ARB. ( Allergy to ACE inhibitor due to cough)  -She will need cardiology evaluation at some point, perhaps outpatient.  Hypothyroidism  -Continue Synthroid. Check TSH  History of laryngeal cancer and breast cancer  -History of squamous cell carcinoma of pyriform sinus s/p Chemoradiation.  -History of invasive right ductal carcinoma status post lumpectomy and chemotherapy.  -Follows with Dr Alvy Bimler.  Severe protein calorie malnutrition.  -On supplements   Code Status: Full Family Communication: Long 30 min discussion c s/o.  Patient doesn;t wish to be intubated again.  Delineating goals of care.  Dr. Claretha Cooper to see on Monday.   Disposition Plan: inpatient-possible transfer to SDU if worsens   >75 min patient care time,  50% or greater direct face to face care  Verneita Griffes, MD  [facility] Pager [phone] 07/25/2013, 12:00 PM    LOS: 21 days

## 2013-07-25 NOTE — Progress Notes (Signed)
Notified Dr Rosendo Gros of chest Xray result, no new order. PT sleeping at this time. Will monitor.

## 2013-07-25 NOTE — Progress Notes (Signed)
20 Days Post-Op  Subjective: Having anxiety overnight and delirium from Ativan which is now resolved.  Seems very winded although never complains of much SOB.  Looks very weak.  Not eating much.  No N/V.  Having BM's and urinating.  PT/OT following.  Had Thoracentesis yesterday.  Patient always asking to go home.  Objective: Vital signs in last 24 hours: Temp:  [98.8 F (37.1 C)-99.5 F (37.5 C)] 99.5 F (37.5 C) (01/24 0500) Pulse Rate:  [90-105] 90 (01/24 0500) Resp:  [17-20] 20 (01/24 0500) BP: (95-133)/(51-68) 95/54 mmHg (01/24 0500) SpO2:  [92 %-94 %] 94 % (01/24 0500) Weight:  [106 lb 8 oz (48.308 kg)] 106 lb 8 oz (48.308 kg) (01/24 0500) Last BM Date: 07/24/13  Intake/Output from previous day: 01/23 0701 - 01/24 0700 In: 1385 [P.O.:390; IV Piggyback:200] Out: 945 [Urine:925; Drains:20] Intake/Output this shift:    PE: Gen:  Alert, NAD, pleasant, but appears very weak and fatigued Card:  RRR, no M/G/R heard Pulm:  B/l course breath sounds and they sound wet at bases, appears SOB at rest Abd: Soft, mild tenderness at drain sites, not much pain at midline wound, ND, +BS, no HSM, midline wound clean with 25% slough, drain with purulent drainage per nursing, but clear right now after emptied, G-tube in place Ext:  No erythema, edema, or tenderness to B/l UE/LE  Lab Results:   Recent Labs  07/24/13 0306 07/25/13 0518  WBC 20.4* 32.1*  HGB 7.8* 7.6*  HCT 23.1* 22.3*  PLT 1263* 1247*   BMET  Recent Labs  07/24/13 0306  NA 133*  K 4.2  CL 93*  CO2 26  GLUCOSE 98  BUN 9  CREATININE 0.62  CALCIUM 7.0*   PT/INR No results found for this basename: LABPROT, INR,  in the last 72 hours CMP     Component Value Date/Time   NA 133* 07/24/2013 0306   NA 133* 04/01/2013 1134   NA 136 03/14/2011 0954   K 4.2 07/24/2013 0306   K 4.2 04/01/2013 1134   K 5.5* 03/14/2011 0954   CL 93* 07/24/2013 0306   CL 96* 10/08/2012 0911   CL 96* 03/14/2011 0954   CO2 26 07/24/2013 0306    CO2 27 04/01/2013 1134   CO2 26 03/14/2011 0954   GLUCOSE 98 07/24/2013 0306   GLUCOSE 106 04/01/2013 1134   GLUCOSE 112* 10/08/2012 0911   GLUCOSE 113 03/14/2011 0954   BUN 9 07/24/2013 0306   BUN 9.9 04/01/2013 1134   BUN 11 03/14/2011 0954   CREATININE 0.62 07/24/2013 0306   CREATININE 0.7 04/01/2013 1134   CREATININE 0.5* 03/14/2011 0954   CALCIUM 7.0* 07/24/2013 0306   CALCIUM 9.5 04/01/2013 1134   CALCIUM 9.5 03/14/2011 0954   PROT 5.8* 07/24/2013 1925   PROT 6.9 04/01/2013 1134   PROT 8.0 03/14/2011 0954   ALBUMIN 1.5* 07/24/2013 1925   ALBUMIN 3.3* 04/01/2013 1134   AST 15 07/24/2013 1925   AST 19 04/01/2013 1134   AST 30 03/14/2011 0954   ALT 7 07/24/2013 1925   ALT 10 04/01/2013 1134   ALT 22 03/14/2011 0954   ALKPHOS 141* 07/24/2013 1925   ALKPHOS 117 04/01/2013 1134   ALKPHOS 101* 03/14/2011 0954   BILITOT <0.2* 07/24/2013 1925   BILITOT 0.27 04/01/2013 1134   BILITOT 0.50 03/14/2011 0954   GFRNONAA >90 07/24/2013 0306   GFRAA >90 07/24/2013 0306   Lipase     Component Value Date/Time   LIPASE 150* 07/04/2013  2245       Studies/Results: Dg Chest 1 View  07/24/2013   CLINICAL DATA:  Status post left-sided thoracentesis. Possible aspiration.  EXAM: CHEST - 1 VIEW  COMPARISON:  07/24/2013  FINDINGS: The cardiomediastinal silhouette is unchanged. Left pleural effusion has decreased in size following thoracentesis. Moderate right pleural effusion does not appear significantly changed. No pneumothorax is identified. Patchy bibasilar lung opacities do not appear significantly changed. Mild pulmonary vascular congestion is unchanged. Small calcified nodule in the left mid lung is unchanged. Surgical clips are noted in the right axilla. Tubing overlies the left upper quadrant.  IMPRESSION: 1. Decreased size of left pleural effusion following thoracentesis. No pneumothorax identified. 2. Unchanged appearance of moderate right pleural effusion. 3. Persistent bibasilar airspace opacities without significant  interval change, which may reflect atelectasis or pneumonia.   Electronically Signed   By: [name] Bores   On: 07/24/2013 16:36   Dg Chest 2 View  07/24/2013   CLINICAL DATA:  Cough and congestion with known pleural effusions  EXAM: CHEST  2 VIEW  COMPARISON:  DG CHEST 1V PORT dated 07/23/2013  FINDINGS: The lungs are adequately inflated. Coarse infrahilar lung markings bilaterally are present and may reflect atelectasis or pneumonia. Moderate-sized bilateral pleural effusions are present and may have increased in volume on the right. There is a stable calcified nodule in the left mid lung. The cardiac silhouette is normal in size. The pulmonary vascularity is mildly prominent centrally but no definite cephalization is demonstrated. The mediastinum is normal in width. There surgical clips in the right axillary region.  IMPRESSION: 1. Moderate-sized bilateral pleural effusions are slightly more conspicuous today especially on the right. 2. Persistent bibasilar atelectasis or pneumonia is present. 3. There is no evidence of significant CHF.   Electronically Signed   By: David  Martinique   On: 07/24/2013 09:18   Dg Chest Port 1 View  07/25/2013   CLINICAL DATA:  Labored breathing.  EXAM: PORTABLE CHEST - 1 VIEW  COMPARISON:  Chest radiograph performed 07/24/2013  FINDINGS: Small to moderate right and small left pleural effusions are seen, with bibasilar airspace opacification, worse on the right. This likely reflects pulmonary edema, though superimposed pneumonia cannot be excluded. No pneumothorax is seen. Apparent bilateral nipple shadows are noted.  The cardiomediastinal silhouette is borderline normal in size. No acute osseous abnormalities are identified. Scattered clips are seen at the right axilla.  IMPRESSION: Small to moderate right and small left pleural effusions, with bibasilar airspace opacification, worse on the right. This likely reflects pulmonary edema, though superimposed pneumonia cannot be  excluded. Findings are relatively stable from the prior study.   Electronically Signed   By: Garald Balding M.D.   On: 07/25/2013 02:55   US Thoracentesis Asp Pleural Space W/img Guide  07/24/2013   CLINICAL DATA:  Shortness of breath, pleural effusion.  EXAM: ULTRASOUND GUIDED left THORACENTESIS  COMPARISON:  None.  FINDINGS: A total of approximately 500 ml of cloudy serous fluid was removed. A fluid sample was sent for laboratory analysis.  IMPRESSION: Successful ultrasound guided left thoracentesis yielding 500 ml of pleural fluid.  Read By:  Tsosie Billing PA-C  PROCEDURE: An ultrasound guided thoracentesis was thoroughly discussed with the patient and questions answered. The benefits, risks, alternatives and complications were also discussed. The patient understands and wishes to proceed with the procedure. Written consent was obtained.  Ultrasound was performed to localize and mark an adequate pocket of fluid in the left chest. The area was then  prepped and draped in the normal sterile fashion. 1% Lidocaine was used for local anesthesia. Under ultrasound guidance a 19 gauge Yueh catheter was introduced. Thoracentesis was performed. The catheter was removed and a dressing applied.  Complications:  none.   Electronically Signed   By: Jacqulynn Cadet M.D.   On: 07/24/2013 16:57    Anti-infectives: Anti-infectives   Start     Dose/Rate Route Frequency Ordered Stop   07/25/13 0400  vancomycin (VANCOCIN) 500 mg in sodium chloride 0.9 % 100 mL IVPB     500 mg 100 mL/hr over 60 Minutes Intravenous Every 12 hours 07/24/13 1542     07/24/13 1545  vancomycin (VANCOCIN) IVPB 1000 mg/200 mL premix     1,000 mg 200 mL/hr over 60 Minutes Intravenous NOW 07/24/13 1542 07/24/13 1826   07/24/13 1400  imipenem-cilastatin (PRIMAXIN) 500 mg in sodium chloride 0.9 % 100 mL IVPB  Status:  Discontinued     500 mg 200 mL/hr over 30 Minutes Intravenous 3 times per day 07/24/13 1110 07/24/13 1141   07/24/13 1300   ertapenem (INVANZ) 1 g in sodium chloride 0.9 % 50 mL IVPB     1 g 100 mL/hr over 30 Minutes Intravenous Every 24 hours 07/24/13 1141     07/23/13 1030  piperacillin-tazobactam (ZOSYN) IVPB 3.375 g  Status:  Discontinued     3.375 g 12.5 mL/hr over 240 Minutes Intravenous 3 times per day 07/23/13 1024 07/23/13 1043   07/23/13 0000  ciprofloxacin (CIPRO) 500 MG tablet     500 mg Oral 2 times daily 07/23/13 0724     07/23/13 0000  fluconazole (DIFLUCAN) 200 MG tablet     200 mg Oral Daily 07/23/13 0724     07/23/13 0000  metroNIDAZOLE (FLAGYL) 500 MG tablet     500 mg Oral Every 6 hours 07/23/13 0724     07/22/13 0900  ciprofloxacin (CIPRO) tablet 500 mg  Status:  Discontinued     500 mg Oral 2 times daily 07/22/13 0757 07/24/13 1110   07/22/13 0845  metroNIDAZOLE (FLAGYL) tablet 500 mg  Status:  Discontinued     500 mg Oral 4 times per day 07/22/13 0757 07/24/13 1110   07/18/13 1000  fluconazole (DIFLUCAN) tablet 200 mg     200 mg Oral Daily 07/17/13 0940     07/15/13 1400  ertapenem (INVANZ) 1 g in sodium chloride 0.9 % 50 mL IVPB  Status:  Discontinued     1 g 100 mL/hr over 30 Minutes Intravenous Every 24 hours 07/15/13 1311 07/22/13 0757   07/14/13 1045  amoxicillin-clavulanate (AUGMENTIN) 875-125 MG per tablet 1 tablet  Status:  Discontinued     1 tablet Oral Every 12 hours 07/14/13 1033 07/15/13 1311   07/07/13 0815  fluconazole (DIFLUCAN) IVPB 200 mg  Status:  Discontinued     200 mg 100 mL/hr over 60 Minutes Intravenous Every 24 hours 07/06/13 1030 07/17/13 0940   07/05/13 0815  fluconazole (DIFLUCAN) IVPB 400 mg  Status:  Discontinued     400 mg 100 mL/hr over 120 Minutes Intravenous Every 24 hours 07/05/13 0800 07/06/13 1030   07/05/13 0800  piperacillin-tazobactam (ZOSYN) IVPB 3.375 g  Status:  Discontinued     3.375 g 12.5 mL/hr over 240 Minutes Intravenous 3 times per day 07/05/13 0742 07/14/13 1033   07/05/13 0230  piperacillin-tazobactam (ZOSYN) IVPB 3.375 g     3.375  g 100 mL/hr over 30 Minutes Intravenous  Once 07/05/13 0229 07/05/13 0344  07/05/13 0230  metroNIDAZOLE (FLAGYL) IVPB 500 mg     500 mg 100 mL/hr over 60 Minutes Intravenous  Once 07/05/13 0229 07/05/13 0444       Assessment/Plan Perforated jejunal ulcer  POD #20 s/p Exploratory laparotomy, insertion of gastric tube, repair of jejunal ulcer perforation(Dr. Donne Hazel 07/05/13)  Intra-abdominal abscess-POD #9 s/p perc drain-16ml/24h output recorded  -cultures show klebsiella pneumoniae sensitive to imipenem  -per IR "subphrenic collection stable- attempt at drainage of this would be difficult due to proximity to diaphragm and could potentially create fistula to pleural space"  -change back to IV atbx  -repeat CT Monday or Tuesday if still here  Leukocytosis - up to 32 from 20.4 yesterday; 07/21/13 CT shows a decrease in abscess, abscess to LUQ stable  Thrombocytosis - 1247 today, ASA 325 QD may need BID, hematology consult? Atelectasis/pleural effusions-Dr. Dhungel/Samtani following, pending echo, cell count, protein, culture, AFB and cytology from thoracentesis, Left yielded 551mL cloudy serous fluid Respiratory failure - more SOB today PCM/Hypoalbuminemia-ensure TID per G tube, resource breeze BID, MVI, not eating much PO Hypokalemia-continue supplement.  Afib with RVR-rate controlled, diltiazem po. Outpatient cardiology follow up  Chronic disease anemia-h&h have been between 7.6-7.8 over last 3 days Hypothyroidism- TSH 33 - needs synthroid increased per medicine Hx ETOH abuse-MVI  Hx laryngeal and breast cancer DVT proph - Lovenox and 325 aspirin Infect - switch out IV line  Low iron, high ferritin, elevated CRP and Sed rate, WBC jumped from 20-32, TSH 33, Platelet 1247, high fibrinogen, abnormal thoracentesis labs, definitely in need of medicines help managing these.  May need hematology consult given platelets continuing to stay elevated and risk of clotting.  No dispo plans for  today.  Will likely need to include Dr. Alvy Bimler her oncologist in her care and if we were to need a palliative care consult.    LOS: 21 days    DORT, Jinny Blossom 07/25/2013, 7:56 AM Pager: [phone]

## 2013-07-25 NOTE — Progress Notes (Addendum)
Dr. Verlon Au called into room around 1815 and spoke with Taylor [name],  he wants to transfer to stepdown and have chest tube placed .  Taylor [name] discussed with Precious Bard and she, the patient does not want to move to step down and does not want a tube in her lung.  Dr. Verlon Au and Dr. Ninfa Linden both informed of pt's wishes.

## 2013-07-26 DIAGNOSIS — R0609 Other forms of dyspnea: Secondary | ICD-10-CM

## 2013-07-26 DIAGNOSIS — Z515 Encounter for palliative care: Secondary | ICD-10-CM

## 2013-07-26 DIAGNOSIS — R5383 Other fatigue: Secondary | ICD-10-CM

## 2013-07-26 DIAGNOSIS — R1310 Dysphagia, unspecified: Secondary | ICD-10-CM

## 2013-07-26 DIAGNOSIS — C099 Malignant neoplasm of tonsil, unspecified: Secondary | ICD-10-CM

## 2013-07-26 DIAGNOSIS — R5381 Other malaise: Secondary | ICD-10-CM

## 2013-07-26 DIAGNOSIS — Z803 Family history of malignant neoplasm of breast: Secondary | ICD-10-CM

## 2013-07-26 DIAGNOSIS — R0989 Other specified symptoms and signs involving the circulatory and respiratory systems: Secondary | ICD-10-CM

## 2013-07-26 DIAGNOSIS — D696 Thrombocytopenia, unspecified: Secondary | ICD-10-CM

## 2013-07-26 DIAGNOSIS — D500 Iron deficiency anemia secondary to blood loss (chronic): Secondary | ICD-10-CM

## 2013-07-26 DIAGNOSIS — K285 Chronic or unspecified gastrojejunal ulcer with perforation: Secondary | ICD-10-CM

## 2013-07-26 DIAGNOSIS — D638 Anemia in other chronic diseases classified elsewhere: Secondary | ICD-10-CM

## 2013-07-26 LAB — CBC WITH DIFFERENTIAL/PLATELET
Basophils Absolute: 0 10*3/uL (ref 0.0–0.1)
Basophils Relative: 0 % (ref 0–1)
EOS PCT: 0 % (ref 0–5)
Eosinophils Absolute: 0 10*3/uL (ref 0.0–0.7)
HCT: 20 % — ABNORMAL LOW (ref 36.0–46.0)
Hemoglobin: 7 g/dL — ABNORMAL LOW (ref 12.0–15.0)
LYMPHS PCT: 3 % — AB (ref 12–46)
Lymphs Abs: 0.7 10*3/uL (ref 0.7–4.0)
MCH: 28.5 pg (ref 26.0–34.0)
MCHC: 35 g/dL (ref 30.0–36.0)
MCV: 81.3 fL (ref 78.0–100.0)
Monocytes Absolute: 1.7 10*3/uL — ABNORMAL HIGH (ref 0.1–1.0)
Monocytes Relative: 7 % (ref 3–12)
Neutro Abs: 21.7 10*3/uL — ABNORMAL HIGH (ref 1.7–7.7)
Neutrophils Relative %: 90 % — ABNORMAL HIGH (ref 43–77)
PLATELETS: 1130 10*3/uL — AB (ref 150–400)
RBC: 2.46 MIL/uL — ABNORMAL LOW (ref 3.87–5.11)
RDW: 16.9 % — AB (ref 11.5–15.5)
WBC: 24.1 10*3/uL — AB (ref 4.0–10.5)

## 2013-07-26 LAB — RENAL FUNCTION PANEL
Albumin: 1.3 g/dL — ABNORMAL LOW (ref 3.5–5.2)
BUN: 11 mg/dL (ref 6–23)
CHLORIDE: 90 meq/L — AB (ref 96–112)
CO2: 25 meq/L (ref 19–32)
Calcium: 7.4 mg/dL — ABNORMAL LOW (ref 8.4–10.5)
Creatinine, Ser: 0.55 mg/dL (ref 0.50–1.10)
GFR calc non Af Amer: >90 mL/min (ref >90)
GLUCOSE: 121 mg/dL — AB (ref 70–99)
PHOSPHORUS: 3.2 mg/dL (ref 2.3–4.6)
POTASSIUM: 4.4 meq/L (ref 3.7–5.3)
Sodium: 130 mEq/L — ABNORMAL LOW (ref 137–147)

## 2013-07-26 LAB — GLUCOSE, CAPILLARY
GLUCOSE-CAPILLARY: 116 mg/dL — AB (ref 70–99)
Glucose-Capillary: 105 mg/dL — ABNORMAL HIGH (ref 70–99)
Glucose-Capillary: 107 mg/dL — ABNORMAL HIGH (ref 70–99)

## 2013-07-26 LAB — FECAL LACTOFERRIN, QUANT: Fecal Lactoferrin: NEGATIVE

## 2013-07-26 MED ORDER — FERROUS SULFATE 325 (65 FE) MG PO TABS: 325.0000 mg | ORAL_TABLET | Freq: Three times a day (TID) | ORAL | Status: DC

## 2013-07-26 MED ORDER — LEVOTHYROXINE SODIUM 100 MCG PO TABS
100.0000 ug | ORAL_TABLET | Freq: Every day | ORAL | Status: DC
  Administered 2013-07-27: 100 ug via ORAL
  Filled 2013-07-26: qty 1

## 2013-07-26 MED ORDER — MAGIC MOUTHWASH W/LIDOCAINE
5.0000 mL | Freq: Three times a day (TID) | ORAL | Status: DC | PRN
  Filled 2013-07-26: qty 5

## 2013-07-26 MED ORDER — FERROUS SULFATE 300 (60 FE) MG/5ML PO SYRP
300.0000 mg | ORAL_SOLUTION | Freq: Three times a day (TID) | ORAL | Status: DC
  Administered 2013-07-26 – 2013-07-27 (×3): 300 mg via ORAL
  Filled 2013-07-26 (×6): qty 5

## 2013-07-26 NOTE — Consult Note (Addendum)
Patient Taylor [name] A Burmaster      DOB: 07/03/1945      TGR:[id number]   Summarry of gols of care, full not to follow.  Met with patient and significant  other [name]  Patient considering not undergoing pleurx catheter placement. Reviewed options. The girls called me after we met earlier this evening and have decided that they want to go  home without pluerx. Catheter in the am.  I told them I would arrive early for further discussion need to assist with arrangements we talked about involving Hospice but [name] was hesitant. Will see in am what they want to do.  Total time 545-625 pm.  Agusta Hackenberg L. Lovena Le, MD MBA The Palliative Medicine Team at [facility] Phone: [phone] Pager: [phone]   Addendum:  For discharge medications, will need to clarify with surgery and patient what meds need to continue for comfort.  Patient can have tube feeding under hospice care.  For pain and dyspnea would use OxIR liquid 20 mg/ml 5 mg q2 hrs prn for pain or dyspnea dispense. 30 ml.  Tahjai Schetter L. Lovena Le, MD MBA The Palliative Medicine Team at [facility] Phone: [phone] Pager: [phone]

## 2013-07-26 NOTE — Progress Notes (Signed)
I have seen and examined the pt and agree with PA-Dort's progress note. Pt would like recs as per Palliative Care at this point. con't current tx

## 2013-07-26 NOTE — Consult Note (Signed)
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 Patients course complicated by respiratory failure with large pleural effusion.  At this time she does not want any further surgeries although her life partner wants her to have a pleurx catheter placed before going home.  They are going to talk it out overnight but at this time it appears that Taylor [name] wants to go home with hospice.   Goals of Care: 1.  Code Status: DNR discharge , for now had been using vasopressors, and antiarrythmics.   2. Scope of Treatment:  1. Respiratory/Oxygen: continue with aggressive nebs and pulmonary toilet 2. Nutritional Support/Tube Feeds: continue for now with supplemental po 3. Antibiotics: complete oral course of antibioitcs 4. Patient discussing possible pleurx catheter placement with partner   4. Disposition: home with hospice no matter what   3. Symptom Management:   1. Anxiety/Agitation:buspar and haldol prn 2. Pain : hydrocodone via tube 3. Bowel regime : monitor 4. Keep catheter for comfort  4. Psychosocial: Taylor [name] is at peace with her decisions .  She is trying to work her partner through this process  59. Spiritual: offered        Patient Documents Completed or Given: Document Given Completed  Advanced Directives Pkt    MOST    DNR    Gone from My Sight    Hard Choices      Brief HPI: 68 yr old white female s/p rupture of gastric ulcer, post op course complicated by respiratory failure. We were asked to assit with goc   ROS: mild abdomen pain,  shortness of breath weakness.    PMH:  Past Medical History  Diagnosis Date  . Hyperlipidemia     STATES SHE NO LONGER HAS HI CHOLESTEROL  . Rhinitis   . Fatigue 12/10/2011  . Hypothyroid 12/11/2011  . Hypertension     hx of, no current med for last 3 years  . Throat cancer 2010  . Breast CA 2008    right breast  . Headache(784.0)     tension  . Nausea alone 04/01/2013  . Difficult airway      PSH: Past Surgical History  Procedure Laterality Date  . Panendoscopy  05/16/2011    Procedure: PANENDOSCOPY;  Surgeon: Tyson Alias;  Location: [facility];  Service: ENT;  Laterality: N/A;  Direct laryngoscopy, esophagoscopy  . Radical neck dissection  05/16/2011    Procedure: RADICAL NECK DISSECTION;  Surgeon: Tyson Alias;  Location: [facility];  Service: ENT;  Laterality: Left;  Modified radical left neck dissection   . Appendectomy  yrs ago  . Cysts removed from back abd breast  yrs ago  . Breast lumpectomy Right 2008  . Esophagogastroduodenoscopy N/A 01/13/2013    Procedure: ESOPHAGOGASTRODUODENOSCOPY (EGD);  Surgeon: Cleotis Nipper, MD;  Location: [facility];  Service: Endoscopy;  Laterality: N/A;  . Balloon dilation N/A 01/13/2013    Procedure: BALLOON DILATION;  Surgeon: Cleotis Nipper, MD;  Location: [facility];  Service: Endoscopy;  Laterality: N/A;  . Laparotomy  N/A 07/05/2013    Procedure: EXPLORATORY LAPAROTOMY, insertion of gastrostomy tube, repair jejunal ulcer perforation.;  Surgeon: Rolm Bookbinder, MD;  Location: [facility];  Service: General;  Laterality: N/A;   I have reviewed the Rosa Sanchez and SH and  If appropriate update it with new information. Allergies  Allergen Reactions  . Ace Inhibitors Cough  . Hydrocodone Itching  . Hydromorphone Itching  . Tylox [Oxycodone-Acetaminophen] Itching  . Sulfa Antibiotics Itching and Rash   Scheduled Meds: . aspirin  325 mg Oral Daily  . azithromycin  250 mg Per Tube Daily  . busPIRone  5 mg Oral BID  . diltiazem  60 mg Per  Tube Q6H  . ertapenem  1 g Intravenous Q24H  . ferrous sulfate  300 mg Oral TID WC  . fluconazole  200 mg Oral Daily  . folic acid  1 mg Oral Daily  . furosemide  80 mg Intravenous BID  . heparin subcutaneous  5,000 Units Subcutaneous Q8H  . insulin aspart  0-15 Units Subcutaneous Q4H  . ipratropium-albuterol  3 mL Nebulization TID  . [START ON 07/27/2013] levothyroxine  100 mcg Oral QAC breakfast  . losartan  25 mg Oral Daily  . Menthol-Zinc Oxide  1 application Topical BID  . multivitamin  5 mL Oral Daily  . nystatin  5 mL Oral QID  . pantoprazole sodium  40 mg Per Tube BID  . potassium chloride  40 mEq Oral Daily  . thiamine  100 mg Oral Daily  . vancomycin  500 mg Intravenous Q12H   Continuous Infusions: . sodium chloride 20 mL/hr (07/25/13 0942)  . feeding supplement (OSMOLITE 1.2 CAL) 1,000 mL (07/26/13 1420)   PRN Meds:.benzocaine, dextrose, diphenhydrAMINE, fentaNYL, glucose-Vitamin C, guaiFENesin, haloperidol, HYDROcodone-acetaminophen, ipratropium-albuterol, magic mouthwash w/lidocaine, menthol-cetylpyridinium, ondansetron (ZOFRAN) IV, oxyCODONE-acetaminophen    BP 105/48  Pulse 105  Temp(Src) 98.7 F (37.1 C) (Oral)  Resp 24  Ht 5' 4.17" (1.63 m)  Wt 48.308 kg (106 lb 8 oz)  BMI 18.18 kg/m2  SpO2 92%   PPS:20-30%   Intake/Output Summary (Last 24 hours) at 07/26/13 2126 Last data filed at 07/26/13 1500  Gross per 24 hour  Intake    689 ml  Output   1305 ml  Net   -616 ml     Physical Exam:  General:  Able to maintain wakefullness but is fatigued HEENT:  PERRL, EOMI, mm dry Chest:   Decreased with poor air entry some increased work of breathing CVS: irregular, S1, S2 Abdomen:dressed general tender Ext: cachectic, cool no mottling Neuro: awake , alert , intermittently oriented,  Labs: CBC    Component Value Date/Time   WBC 24.1* 07/26/2013 0759   WBC 7.7 04/01/2013 1135   RBC 2.46* 07/26/2013 0759   RBC 3.55* 04/01/2013 1135   HGB 7.0* 07/26/2013  0759   HGB 11.0* 04/01/2013 1135   HCT 20.0* 07/26/2013 0759   HCT 31.9* 04/01/2013 1135   PLT 1130* 07/26/2013 0759   PLT 579* 04/01/2013 1135   MCV 81.3 07/26/2013 0759   MCV 89.8 04/01/2013 1135   MCH 28.5 07/26/2013 0759   MCH 31.1 04/01/2013 1135   MCHC 35.0 07/26/2013 0759   MCHC 34.6 04/01/2013 1135   RDW 16.9* 07/26/2013 0759   RDW 14.9* 04/01/2013 1135   LYMPHSABS 0.7 07/26/2013 0759   LYMPHSABS 0.8* 04/01/2013 1135   MONOABS 1.7* 07/26/2013 0759   MONOABS 1.0* 04/01/2013 1135   EOSABS 0.0 07/26/2013 0759   EOSABS 0.4 04/01/2013 1135  BASOSABS 0.0 07/26/2013 0759   BASOSABS 0.1 04/01/2013 1135       CMP     Component Value Date/Time   NA 130* 07/26/2013 0759   NA 133* 04/01/2013 1134   NA 136 03/14/2011 0954   K 4.4 07/26/2013 0759   K 4.2 04/01/2013 1134   K 5.5* 03/14/2011 0954   CL 90* 07/26/2013 0759   CL 96* 10/08/2012 0911   CL 96* 03/14/2011 0954   CO2 25 07/26/2013 0759   CO2 27 04/01/2013 1134   CO2 26 03/14/2011 0954   GLUCOSE 121* 07/26/2013 0759   GLUCOSE 106 04/01/2013 1134   GLUCOSE 112* 10/08/2012 0911   GLUCOSE 113 03/14/2011 0954   BUN 11 07/26/2013 0759   BUN 9.9 04/01/2013 1134   BUN 11 03/14/2011 0954   CREATININE 0.55 07/26/2013 0759   CREATININE 0.7 04/01/2013 1134   CREATININE 0.5* 03/14/2011 0954   CALCIUM 7.4* 07/26/2013 0759   CALCIUM 9.5 04/01/2013 1134   CALCIUM 9.5 03/14/2011 0954   PROT 5.8* 07/24/2013 1925   PROT 6.9 04/01/2013 1134   PROT 8.0 03/14/2011 0954   ALBUMIN 1.3* 07/26/2013 0759   ALBUMIN 3.3* 04/01/2013 1134   AST 15 07/24/2013 1925   AST 19 04/01/2013 1134   AST 30 03/14/2011 0954   ALT 7 07/24/2013 1925   ALT 10 04/01/2013 1134   ALT 22 03/14/2011 0954   ALKPHOS 141* 07/24/2013 1925   ALKPHOS 117 04/01/2013 1134   ALKPHOS 101* 03/14/2011 0954   BILITOT <0.2* 07/24/2013 1925   BILITOT 0.27 04/01/2013 1134   BILITOT 0.50 03/14/2011 0954   GFRNONAA >90 07/26/2013 0759   GFRAA >90 07/26/2013 0759    Chest Xray Reviewed/Impressions: Small to moderate right  and small left pleural effusions, with  bibasilar airspace opacification, worse on the right. This likely  reflects pulmonary edema, though superimposed pneumonia cannot be  excluded. Findings are relatively stable from the prior study.    CT scan of the Chest Reviewed/Impressions: New large right and small left pleural effusions with compressive  atelectasis. Diffuse right lung atelectasis or consolidation noted,  without evidence of centrally obstructing mass.  Mild patchy areas of infiltrate in peripheral left upper and lower  lobes. Pneumonia or other inflammatory process cannot be excluded.  Mild right paratracheal mediastinal lymphadenopathy, which is  nonspecific. This may be reactive in etiology although metastatic  disease cannot be excluded    Time In Time Out Total Time Spent with Patient Total Overall Time  545 pm 625 pm 40 min 40 min    Greater than 50%  of this time was spent counseling and coordinating care related to the above assessment and plan.   Cinthya Bors L. Lovena Le, MD MBA The Palliative Medicine Team at [facility] Phone: [phone] Pager: [phone]

## 2013-07-26 NOTE — Progress Notes (Signed)
Thank you for consulting the Palliative Medicine Team at [facility] to meet your patient's and family's needs.   The reason that you asked [place] to see your patient is  For GOC  We have scheduled your patient for a meeting:  1/25 6 pm  The Surrogate decision make is: Patient and significant other  Contact information  Other family members that need to be present:  At their discretion    Your patient is able/unable to participate: able  Additional Narrative:   Aamina Skiff L. Lovena Le, MD MBA The Palliative Medicine Team at [facility] Phone: [phone] Pager: [phone]

## 2013-07-26 NOTE — Progress Notes (Signed)
Taylor [name]   DOB:1945/07/20   [id number]   [id number]  Subjective: Patient sending in chair.  She denies chest pain.  She reports a hoarse voice.  Nurse and family in the room.  No acute overnight events.  She has foley, drains and feeding tubes.    Objective:  Filed Vitals:   07/26/13 0500  BP: 105/48  Pulse: 105  Temp: 98.7 F (37.1 C)  Resp: 24    Body mass index is 18.18 kg/(m^2).  Intake/Output Summary (Last 24 hours) at 07/26/13 0808 Last data filed at 07/26/13 0516  Gross per 24 hour  Intake   1541 ml  Output   2115 ml  Net   -574 ml    Non-toxic appearing. Chronically ill, thin.   Sclerae unicteric  Oropharynx clear  Lungs breath sounds at the bases  Heart regular rate and rhythm  Abdomen  With multiple drains +PEG  MSK no peripheral edema  Neuro nonfocal    CBG (last 3)   Recent Labs  07/25/13 1150 07/25/13 1750 07/25/13 2035  GLUCAP 159* 112* 134*     Labs:  Lab Results  Component Value Date   WBC 32.1* 07/25/2013   HGB 7.6* 07/25/2013   HCT 22.3* 07/25/2013   MCV 80.5 07/25/2013   PLT 1247* 07/25/2013   NEUTROABS 12.5* [id number]   Basic Metabolic Panel:  Recent Labs Lab 07/21/13 0418 07/24/13 0306  NA 133* 133*  K 3.9 4.2  CL 100 93*  CO2 21 26  GLUCOSE 99 98  BUN 4* 9  CREATININE 0.48* 0.62  CALCIUM 6.7* 7.0*   GFR Estimated Creatinine Clearance: 52 ml/min (by C-G formula based on Cr of 0.62). Liver Function Tests:  Recent Labs Lab 07/24/13 1925  AST 15  ALT 7  ALKPHOS 141*  BILITOT <0.2*  PROT 5.8*  ALBUMIN 1.5*   CBC:  Recent Labs Lab 07/21/13 0418 07/23/13 0328 07/24/13 0306 07/25/13 0518  WBC 16.4* 18.6* 20.4* 32.1*  HGB 8.4* 7.6* 7.8* 7.6*  HCT 24.4* 22.7* 23.1* 22.3*  MCV 81.3 83.5 81.3 80.5  PLT 1079* 1256* 1263* 1247*   CBG:  Recent Labs Lab 07/25/13 0004 07/25/13 0509 07/25/13 1150 07/25/13 1750 07/25/13 2035  GLUCAP 100* 110* 159* 112* 134*   Thyroid function studies  Recent  Labs  07/24/13 1435  TSH 33.427*   Anemia work up  Recent Labs  07/24/13 1435  FERRITIN 358*  TIBC Not calculated due to Iron <10.  IRON <10*   Microbiology Recent Results (from the past 240 hour(s))  CULTURE, ROUTINE-ABSCESS     Status: None   Collection Time    07/16/13  3:32 PM      Result Value Range Status   Specimen Description ABSCESS PERITONEAL CAVITY   Final   Special Requests NONE   Final   Gram Stain     Final   Value: FEW WBC PRESENT,BOTH PMN AND MONONUCLEAR     NO SQUAMOUS EPITHELIAL CELLS SEEN     NO ORGANISMS SEEN     Performed at [facility]   Culture     Final   Value: FEW KLEBSIELLA PNEUMONIAE     Performed at [facility]   Report Status 07/20/2013 FINAL   Final   Organism ID, Bacteria KLEBSIELLA PNEUMONIAE   Final  CULTURE, EXPECTORATED SPUTUM-ASSESSMENT     Status: None   Collection Time    07/23/13 10:22 AM      Result Value Range Status   Specimen  Description SPUTUM   Final   Special Requests Normal   Final   Sputum evaluation     Final   Value: MICROSCOPIC FINDINGS SUGGEST THAT THIS SPECIMEN IS NOT REPRESENTATIVE OF LOWER RESPIRATORY SECRETIONS. PLEASE RECOLLECT.     CALLED TO V CAIN,RN 07/23/13 1150 BY K SCHULTZ   Report Status 07/23/2013 FINAL   Final  BODY FLUID CULTURE     Status: None   Collection Time    07/24/13  3:57 PM      Result Value Range Status   Specimen Description PLEURAL FLUID LEFT   Final   Special Requests Normal   Final   Gram Stain     Final   Value: NO SQUAMOUS EPITHELIAL CELLS SEEN     MODERATE WBC PRESENT, PREDOMINANTLY PMN     NO ORGANISMS SEEN     Performed at [facility]   Culture     Final   Value: NO GROWTH 1 DAY     Performed at [facility]   Report Status PENDING   Incomplete  CLOSTRIDIUM DIFFICILE BY PCR     Status: None   Collection Time    07/25/13  3:41 PM      Result Value Range Status   C difficile by pcr NEGATIVE  NEGATIVE Final      Studies:  Dg Chest 1  View  07/24/2013   CLINICAL DATA:  Status post left-sided thoracentesis. Possible aspiration.  EXAM: CHEST - 1 VIEW  COMPARISON:  07/24/2013  FINDINGS: The cardiomediastinal silhouette is unchanged. Left pleural effusion has decreased in size following thoracentesis. Moderate right pleural effusion does not appear significantly changed. No pneumothorax is identified. Patchy bibasilar lung opacities do not appear significantly changed. Mild pulmonary vascular congestion is unchanged. Small calcified nodule in the left mid lung is unchanged. Surgical clips are noted in the right axilla. Tubing overlies the left upper quadrant.  IMPRESSION: 1. Decreased size of left pleural effusion following thoracentesis. No pneumothorax identified. 2. Unchanged appearance of moderate right pleural effusion. 3. Persistent bibasilar airspace opacities without significant interval change, which may reflect atelectasis or pneumonia.   Electronically Signed   By: [name] Bores   On: 07/24/2013 16:36   Dg Chest 2 View  07/24/2013   CLINICAL DATA:  Cough and congestion with known pleural effusions  EXAM: CHEST  2 VIEW  COMPARISON:  DG CHEST 1V PORT dated 07/23/2013  FINDINGS: The lungs are adequately inflated. Coarse infrahilar lung markings bilaterally are present and may reflect atelectasis or pneumonia. Moderate-sized bilateral pleural effusions are present and may have increased in volume on the right. There is a stable calcified nodule in the left mid lung. The cardiac silhouette is normal in size. The pulmonary vascularity is mildly prominent centrally but no definite cephalization is demonstrated. The mediastinum is normal in width. There surgical clips in the right axillary region.  IMPRESSION: 1. Moderate-sized bilateral pleural effusions are slightly more conspicuous today especially on the right. 2. Persistent bibasilar atelectasis or pneumonia is present. 3. There is no evidence of significant CHF.   Electronically Signed    By: Nicanor Mendolia  [place]   On: 07/24/2013 09:18   Ct Chest W Contrast  07/25/2013   CLINICAL DATA:  Breast carcinoma and throat carcinoma. Pleural effusions and dyspnea.  EXAM: CT CHEST WITH CONTRAST  TECHNIQUE: Multidetector CT imaging of the chest was performed during intravenous contrast administration.  CONTRAST:  64mL OMNIPAQUE IOHEXOL 300 MG/ML  SOLN  COMPARISON:  PET-CT on  09/12/2009  FINDINGS: A new large right pleural effusion is seen with diffuse right lung volume loss and consolidation. Persistent air bronchograms are seen throughout the right lung, with no evidence of central endobronchial obstruction. A new moderate left pleural effusion is also seen with mild compressive atelectasis in the posterior left lower lobe. Mild patchy areas of infiltrate or seen in the peripheral lingula and left lower lobe, but no discrete pulmonary nodules or masses are identified. There is no evidence of hilar lymphadenopathy. Mild mediastinal lymphadenopathy is noted in the right paratracheal region with largest lymph node measuring 11 mm on image 21. No other sites of mediastinal lymphadenopathy identified.  Surgical clips noted in the right axilla. No evidence chest wall mass or suspicious bone lesions. Both adrenal glands are normal in appearance.  IMPRESSION: New large right and small left pleural effusions with compressive atelectasis. Diffuse right lung atelectasis or consolidation noted, without evidence of centrally obstructing mass.  Mild patchy areas of infiltrate in peripheral left upper and lower lobes. Pneumonia or other inflammatory process cannot be excluded.  Mild right paratracheal mediastinal lymphadenopathy, which is nonspecific. This may be reactive in etiology although metastatic disease cannot be excluded.   Electronically Signed   By: Earle Gell M.D.   On: 07/25/2013 18:04   Dg Chest Port 1 View  07/25/2013   CLINICAL DATA:  Labored breathing.  EXAM: PORTABLE CHEST - 1 VIEW  COMPARISON:  Chest  radiograph performed 07/24/2013  FINDINGS: Small to moderate right and small left pleural effusions are seen, with bibasilar airspace opacification, worse on the right. This likely reflects pulmonary edema, though superimposed pneumonia cannot be excluded. No pneumothorax is seen. Apparent bilateral nipple shadows are noted.  The cardiomediastinal silhouette is borderline normal in size. No acute osseous abnormalities are identified. Scattered clips are seen at the right axilla.  IMPRESSION: Small to moderate right and small left pleural effusions, with bibasilar airspace opacification, worse on the right. This likely reflects pulmonary edema, though superimposed pneumonia cannot be excluded. Findings are relatively stable from the prior study.   Electronically Signed   By: Garald Balding M.D.   On: 07/25/2013 02:55   US Thoracentesis Asp Pleural Space W/img Guide  07/24/2013   CLINICAL DATA:  Shortness of breath, pleural effusion.  EXAM: ULTRASOUND GUIDED left THORACENTESIS  COMPARISON:  None.  FINDINGS: A total of approximately 500 ml of cloudy serous fluid was removed. A fluid sample was sent for laboratory analysis.  IMPRESSION: Successful ultrasound guided left thoracentesis yielding 500 ml of pleural fluid.  Read By:  Tsosie Billing PA-C  PROCEDURE: An ultrasound guided thoracentesis was thoroughly discussed with the patient and questions answered. The benefits, risks, alternatives and complications were also discussed. The patient understands and wishes to proceed with the procedure. Written consent was obtained.  Ultrasound was performed to localize and mark an adequate pocket of fluid in the left chest. The area was then prepped and draped in the normal sterile fashion. 1% Lidocaine was used for local anesthesia. Under ultrasound guidance a 19 gauge Yueh catheter was introduced. Thoracentesis was performed. The catheter was removed and a dressing applied.  Complications:  none.   Electronically Signed    By: Jacqulynn Cadet M.D.   On: 07/24/2013 16:57    Assessment: 68 y.o. with history of breast cancer and squamous cell carcinoma of the larynx  PLAN:  #1 Anemia  Likely of mixed etiology of acute blood loss and anemia chronic disease and/of critical illness. Admission hbg  on 01/03 was 12.3 with MCV of 83, normal RDW. Please transfuse to maintain a hemoglobin greater than 7 and/or if signs of bleeding.  Hemolysis is less likely based on history.  Iron indices as noted above.  Recommend: Ferrous sulfate 325 mg tid.    #2 Thrombocytosis  Given the timeline, it is likely reactive thrombosis secondary to #3, 5, 6. Admission Plts were 709. I suspect treating the infection, #3 will help improve this.  Other consideration would be adding hydrea but it will likely cause delayed healing for #3 and/or further decrease in hbg #1.  She is not symptomatic presently.    #3 Jejunum Ulcer, perforated.  --S/p Ex. Lap, primary repair of jejunal interval with omental patch, gastrostomy tube placement on [id number] --Complicated by intra-abdominal abscess-POD #10 s/p perc drain cultures with klebsiella pneumoniae sensitive to imipenem   #4. Leukocytosis. --Secondary to #3.  Admission WBC of 5.6 with increased bands on 01/03 suggested of infection.  Today WBC of 24.1 with toxic granulation suggestive of resolving infection. Continue antibiotics per primary team. If persists despite antibiotics, likely abscess.   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  --Per Dr. Calton Dach notes, the patient did not want to continue any form of adjuvant treatment for this. We will continue supportive care only. She also does not want mammograms. --Recommend palliative care consult.    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  --The patient has possible microscopic disease. She did want to take any  adjuvant treatment after her last resection. The patient wants supportive care only. We will not order any imaging study   Reginna Sermeno, MD 07/26/2013  8:08 AM

## 2013-07-26 NOTE — Progress Notes (Signed)
21 Days Post-Op  Subjective: Pt feels better today.  No N/V.  Some SOB, but WOB improved.  Refused a lot of medical care last night due to be overwhelmed and anxious.  Having panic attacks.  Concerned that her cancer is back.  She really wants to go home.  Abdominal pain minimal.  Drain with minimal output still some what purulent.  Tolerated more food last night and this am than has over the last few days.  Getting tube feeds which seems to have given her some strength.  Discussed at length about current conditions and need for palliative care consult in order to make some deliniation of her goals of care.  Urged her to get the Chest tube placed to see if we can help her breathe better.    Objective: Vital signs in last 24 hours: Temp:  [98.2 F (36.8 C)-98.7 F (37.1 C)] 98.7 F (37.1 C) (01/25 0500) Pulse Rate:  [88-106] 105 (01/25 0500) Resp:  [20-28] 24 (01/25 0500) BP: (88-105)/(38-60) 105/48 mmHg (01/25 0500) SpO2:  [87 %-94 %] 91 % (01/25 0603) FiO2 (%):  [28 %-36 %] 28 % (01/24 1421) Last BM Date: 07/25/13  Intake/Output from previous day: 01/24 0701 - 01/25 0700 In: 1878 [I.V.:469; NG/GT:380; IV Piggyback:350] Out: 2515 [Urine:2300; Drains:15; Stool:200] Intake/Output this shift:    PE: Gen:  Alert, NAD, pleasant Card:  RRR, no M/G/R heard Pulm:  Course breath sounds, improved effort, WOB improved, [place] on. Abd: Soft, NT/ND, +BS, no HSM, dressing clean, G tube with TF's at 13mL/hour   Lab Results:   Recent Labs  07/24/13 0306 07/25/13 0518  WBC 20.4* 32.1*  HGB 7.8* 7.6*  HCT 23.1* 22.3*  PLT 1263* 1247*   BMET  Recent Labs  07/24/13 0306  NA 133*  K 4.2  CL 93*  CO2 26  GLUCOSE 98  BUN 9  CREATININE 0.62  CALCIUM 7.0*   PT/INR No results found for this basename: LABPROT, INR,  in the last 72 hours CMP     Component Value Date/Time   NA 133* 07/24/2013 0306   NA 133* 04/01/2013 1134   NA 136 03/14/2011 0954   K 4.2 07/24/2013 0306   K 4.2 04/01/2013  1134   K 5.5* 03/14/2011 0954   CL 93* 07/24/2013 0306   CL 96* 10/08/2012 0911   CL 96* 03/14/2011 0954   CO2 26 07/24/2013 0306   CO2 27 04/01/2013 1134   CO2 26 03/14/2011 0954   GLUCOSE 98 07/24/2013 0306   GLUCOSE 106 04/01/2013 1134   GLUCOSE 112* 10/08/2012 0911   GLUCOSE 113 03/14/2011 0954   BUN 9 07/24/2013 0306   BUN 9.9 04/01/2013 1134   BUN 11 03/14/2011 0954   CREATININE 0.62 07/24/2013 0306   CREATININE 0.7 04/01/2013 1134   CREATININE 0.5* 03/14/2011 0954   CALCIUM 7.0* 07/24/2013 0306   CALCIUM 9.5 04/01/2013 1134   CALCIUM 9.5 03/14/2011 0954   PROT 5.8* 07/24/2013 1925   PROT 6.9 04/01/2013 1134   PROT 8.0 03/14/2011 0954   ALBUMIN 1.5* 07/24/2013 1925   ALBUMIN 3.3* 04/01/2013 1134   AST 15 07/24/2013 1925   AST 19 04/01/2013 1134   AST 30 03/14/2011 0954   ALT 7 07/24/2013 1925   ALT 10 04/01/2013 1134   ALT 22 03/14/2011 0954   ALKPHOS 141* 07/24/2013 1925   ALKPHOS 117 04/01/2013 1134   ALKPHOS 101* 03/14/2011 0954   BILITOT <0.2* 07/24/2013 1925   BILITOT 0.27 04/01/2013 1134  BILITOT 0.50 03/14/2011 0954   GFRNONAA >90 07/24/2013 0306   GFRAA >90 07/24/2013 0306   Lipase     Component Value Date/Time   LIPASE 150* 07/04/2013 2245       Studies/Results: Dg Chest 1 View  07/24/2013   CLINICAL DATA:  Status post left-sided thoracentesis. Possible aspiration.  EXAM: CHEST - 1 VIEW  COMPARISON:  07/24/2013  FINDINGS: The cardiomediastinal silhouette is unchanged. Left pleural effusion has decreased in size following thoracentesis. Moderate right pleural effusion does not appear significantly changed. No pneumothorax is identified. Patchy bibasilar lung opacities do not appear significantly changed. Mild pulmonary vascular congestion is unchanged. Small calcified nodule in the left mid lung is unchanged. Surgical clips are noted in the right axilla. Tubing overlies the left upper quadrant.  IMPRESSION: 1. Decreased size of left pleural effusion following thoracentesis. No pneumothorax  identified. 2. Unchanged appearance of moderate right pleural effusion. 3. Persistent bibasilar airspace opacities without significant interval change, which may reflect atelectasis or pneumonia.   Electronically Signed   By: [name] Bores   On: 07/24/2013 16:36   Dg Chest 2 View  07/24/2013   CLINICAL DATA:  Cough and congestion with known pleural effusions  EXAM: CHEST  2 VIEW  COMPARISON:  DG CHEST 1V PORT dated 07/23/2013  FINDINGS: The lungs are adequately inflated. Coarse infrahilar lung markings bilaterally are present and may reflect atelectasis or pneumonia. Moderate-sized bilateral pleural effusions are present and may have increased in volume on the right. There is a stable calcified nodule in the left mid lung. The cardiac silhouette is normal in size. The pulmonary vascularity is mildly prominent centrally but no definite cephalization is demonstrated. The mediastinum is normal in width. There surgical clips in the right axillary region.  IMPRESSION: 1. Moderate-sized bilateral pleural effusions are slightly more conspicuous today especially on the right. 2. Persistent bibasilar atelectasis or pneumonia is present. 3. There is no evidence of significant CHF.   Electronically Signed   By: David  [place]ique   On: 07/24/2013 09:18   Ct Chest W Contrast  07/25/2013   CLINICAL DATA:  Breast carcinoma and throat carcinoma. Pleural effusions and dyspnea.  EXAM: CT CHEST WITH CONTRAST  TECHNIQUE: Multidetector CT imaging of the chest was performed during intravenous contrast administration.  CONTRAST:  38mL OMNIPAQUE IOHEXOL 300 MG/ML  SOLN  COMPARISON:  PET-CT on 09/12/2009  FINDINGS: A new large right pleural effusion is seen with diffuse right lung volume loss and consolidation. Persistent air bronchograms are seen throughout the right lung, with no evidence of central endobronchial obstruction. A new moderate left pleural effusion is also seen with mild compressive atelectasis in the posterior left lower  lobe. Mild patchy areas of infiltrate or seen in the peripheral lingula and left lower lobe, but no discrete pulmonary nodules or masses are identified. There is no evidence of hilar lymphadenopathy. Mild mediastinal lymphadenopathy is noted in the right paratracheal region with largest lymph node measuring 11 mm on image 21. No other sites of mediastinal lymphadenopathy identified.  Surgical clips noted in the right axilla. No evidence chest wall mass or suspicious bone lesions. Both adrenal glands are normal in appearance.  IMPRESSION: New large right and small left pleural effusions with compressive atelectasis. Diffuse right lung atelectasis or consolidation noted, without evidence of centrally obstructing mass.  Mild patchy areas of infiltrate in peripheral left upper and lower lobes. Pneumonia or other inflammatory process cannot be excluded.  Mild right paratracheal mediastinal lymphadenopathy, which is nonspecific. This  may be reactive in etiology although metastatic disease cannot be excluded.   Electronically Signed   By: Earle Gell M.D.   On: 07/25/2013 18:04   Dg Chest Port 1 View  07/25/2013   CLINICAL DATA:  Labored breathing.  EXAM: PORTABLE CHEST - 1 VIEW  COMPARISON:  Chest radiograph performed 07/24/2013  FINDINGS: Small to moderate right and small left pleural effusions are seen, with bibasilar airspace opacification, worse on the right. This likely reflects pulmonary edema, though superimposed pneumonia cannot be excluded. No pneumothorax is seen. Apparent bilateral nipple shadows are noted.  The cardiomediastinal silhouette is borderline normal in size. No acute osseous abnormalities are identified. Scattered clips are seen at the right axilla.  IMPRESSION: Small to moderate right and small left pleural effusions, with bibasilar airspace opacification, worse on the right. This likely reflects pulmonary edema, though superimposed pneumonia cannot be excluded. Findings are relatively stable  from the prior study.   Electronically Signed   By: Garald Balding M.D.   On: 07/25/2013 02:55   US Thoracentesis Asp Pleural Space W/img Guide  07/24/2013   CLINICAL DATA:  Shortness of breath, pleural effusion.  EXAM: ULTRASOUND GUIDED left THORACENTESIS  COMPARISON:  None.  FINDINGS: A total of approximately 500 ml of cloudy serous fluid was removed. A fluid sample was sent for laboratory analysis.  IMPRESSION: Successful ultrasound guided left thoracentesis yielding 500 ml of pleural fluid.  Read By:  Tsosie Billing PA-C  PROCEDURE: An ultrasound guided thoracentesis was thoroughly discussed with the patient and questions answered. The benefits, risks, alternatives and complications were also discussed. The patient understands and wishes to proceed with the procedure. Written consent was obtained.  Ultrasound was performed to localize and mark an adequate pocket of fluid in the left chest. The area was then prepped and draped in the normal sterile fashion. 1% Lidocaine was used for local anesthesia. Under ultrasound guidance a 19 gauge Yueh catheter was introduced. Thoracentesis was performed. The catheter was removed and a dressing applied.  Complications:  none.   Electronically Signed   By: Jacqulynn Cadet M.D.   On: 07/24/2013 16:57    Anti-infectives: Anti-infectives   Start     Dose/Rate Route Frequency Ordered Stop   07/26/13 1000  azithromycin (ZITHROMAX) 200 MG/5ML suspension 250 mg     250 mg Per Tube Daily 07/25/13 1302 07/30/13 0959   07/25/13 1500  azithromycin (ZITHROMAX) 200 MG/5ML suspension 500 mg     500 mg Per Tube  Once 07/25/13 1302 07/25/13 1525   07/25/13 1248  azithromycin (ZITHROMAX) 200 MG/5ML suspension 250 mg  Status:  Discontinued    Comments:  Give 500mg  PO on day 1, Give 250mg  PO on day 2-5.   250 mg Oral See admin instructions 07/25/13 1249 07/25/13 1301   07/25/13 0400  vancomycin (VANCOCIN) 500 mg in sodium chloride 0.9 % 100 mL IVPB     500 mg 100 mL/hr over  60 Minutes Intravenous Every 12 hours 07/24/13 1542     07/24/13 1545  vancomycin (VANCOCIN) IVPB 1000 mg/200 mL premix     1,000 mg 200 mL/hr over 60 Minutes Intravenous NOW 07/24/13 1542 07/24/13 1826   07/24/13 1400  imipenem-cilastatin (PRIMAXIN) 500 mg in sodium chloride 0.9 % 100 mL IVPB  Status:  Discontinued     500 mg 200 mL/hr over 30 Minutes Intravenous 3 times per day 07/24/13 1110 07/24/13 1141   07/24/13 1300  ertapenem (INVANZ) 1 g in sodium chloride 0.9 % 50  mL IVPB     1 g 100 mL/hr over 30 Minutes Intravenous Every 24 hours 07/24/13 1141     07/23/13 1030  piperacillin-tazobactam (ZOSYN) IVPB 3.375 g  Status:  Discontinued     3.375 g 12.5 mL/hr over 240 Minutes Intravenous 3 times per day 07/23/13 1024 07/23/13 1043   07/23/13 0000  ciprofloxacin (CIPRO) 500 MG tablet     500 mg Oral 2 times daily 07/23/13 0724     07/23/13 0000  fluconazole (DIFLUCAN) 200 MG tablet     200 mg Oral Daily 07/23/13 0724     07/23/13 0000  metroNIDAZOLE (FLAGYL) 500 MG tablet     500 mg Oral Every 6 hours 07/23/13 0724     07/22/13 0900  ciprofloxacin (CIPRO) tablet 500 mg  Status:  Discontinued     500 mg Oral 2 times daily 07/22/13 0757 07/24/13 1110   07/22/13 0845  metroNIDAZOLE (FLAGYL) tablet 500 mg  Status:  Discontinued     500 mg Oral 4 times per day 07/22/13 0757 07/24/13 1110   07/18/13 1000  fluconazole (DIFLUCAN) tablet 200 mg     200 mg Oral Daily 07/17/13 0940     07/15/13 1400  ertapenem (INVANZ) 1 g in sodium chloride 0.9 % 50 mL IVPB  Status:  Discontinued     1 g 100 mL/hr over 30 Minutes Intravenous Every 24 hours 07/15/13 1311 07/22/13 0757   07/14/13 1045  amoxicillin-clavulanate (AUGMENTIN) 875-125 MG per tablet 1 tablet  Status:  Discontinued     1 tablet Oral Every 12 hours 07/14/13 1033 07/15/13 1311   07/07/13 0815  fluconazole (DIFLUCAN) IVPB 200 mg  Status:  Discontinued     200 mg 100 mL/hr over 60 Minutes Intravenous Every 24 hours 07/06/13 1030 07/17/13  0940   07/05/13 0815  fluconazole (DIFLUCAN) IVPB 400 mg  Status:  Discontinued     400 mg 100 mL/hr over 120 Minutes Intravenous Every 24 hours 07/05/13 0800 07/06/13 1030   07/05/13 0800  piperacillin-tazobactam (ZOSYN) IVPB 3.375 g  Status:  Discontinued     3.375 g 12.5 mL/hr over 240 Minutes Intravenous 3 times per day 07/05/13 0742 07/14/13 1033   07/05/13 0230  piperacillin-tazobactam (ZOSYN) IVPB 3.375 g     3.375 g 100 mL/hr over 30 Minutes Intravenous  Once 07/05/13 0229 07/05/13 0344   07/05/13 0230  metroNIDAZOLE (FLAGYL) IVPB 500 mg     500 mg 100 mL/hr over 60 Minutes Intravenous  Once 07/05/13 0229 07/05/13 0444       Assessment/Plan Perforated jejunal ulcer  POD #21 s/p Exploratory laparotomy, insertion of gastric tube, repair of jejunal ulcer perforation (Dr. Donne Hazel 07/05/13)  Intra-abdominal abscess-POD #10 s/p perc drain-78ml/24h output recorded  -cultures show klebsiella pneumoniae sensitive to imipenem  -per IR "subphrenic collection stable- attempt at drainage of this would be difficult due to proximity to diaphragm and could potentially create fistula to pleural space"  -change back to IV atbx  -repeat CT Monday or Tuesday if still here  Leukocytosis - up to 32 yesterday - 24 today; 07/21/13 CT shows a decrease in abscess, abscess to LUQ stable  Thrombocytosis - 1247 today, ASA 325 QD, may need BID, hematology to eval Atelectasis/pleural effusions-Dr. Verlon Au following, pending echo, thoracentesis showed transudative, but treating as though exudative due to WBC >8000, Left yielded 571mL cloudy serous fluid, CT obtained and shows large right and small left pleural effusion, recommendation is for chest tube insertion, but patient currently refusing, but  may agree to the chest tube. Respiratory failure - more SOB today  PCM/Hypoalbuminemia-ensure TID per G tube, resource breeze BID, MVI, not eating much PO  Hypokalemia-continue supplement.  Afib with RVR-rate  controlled, diltiazem po. Outpatient cardiology follow up  Chronic disease anemia-h&h have been between 7.6-7.8 over last 3 days  Hypothyroidism- TSH 33 - may need synthroid increased per medicine  Hx ETOH abuse-MVI  Hx laryngeal and breast cancer  DVT proph - Lovenox and 325 aspirin, pending Hematology consult today Infect - IV switched yesterday, C. Diff negative, Pertussis PCR pending but on prophylactic Azithromycin FEN - start [id number] needs cyclical ([id number]) TF and encourage orals, can supplement Ensure if patient not willing/able to take PO  Code status - partial code Diet - TF supplementation and D3 diet as tolerated  Low iron, high ferritin, elevated CRP and Sed rate, WBC jumped from 20-32, TSH 33, Platelet 1247, high fibrinogen, abnormal thoracentesis labs, definitely in need of medicines help managing these.   No dispo plans for today. Dr. Alvy Bimler her oncologist to see the patient on Monday.  Will consult palliative care medicine to help [place] delineate goals of care - Dr. Lovena Le to see today or tomorrow.  Refused many treatments overnight including dressing change, heparin, chest tube, transfer to SDU due to low saturations, CBG's and vital signs.  Seems to be receptive to resuming most care with limitations.  I believe palliative care team will be a huge help with this and the patient and partner [name] agree.      LOS: 22 days    DORT, Jinny Blossom 07/26/2013, 7:32 AM Pager: [phone]

## 2013-07-26 NOTE — Progress Notes (Signed)
Pt. and friend requested some therapies not be done as so pt. could have a good night's sleep i.e q4h CBG's,VS's,etc.

## 2013-07-26 NOTE — Progress Notes (Addendum)
CONSULT NOTE  Note: This document was prepared with digital dictation and possible smart phrase technology. Any transcriptional errors that result from this process are unintentional.   Taylor [name] TUU:[id number] DOB: 1945/12/08 DOA: 07/04/2013 PCP: Lottie Dawson, MD  Brief narrative: 68 y/o ?, known complex PMH including esophageal stricture followed by Dr. 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 Rx three cycles of Cytoxan, Taxotere and intolerant to all six planned cycles secondary to severe myalgia/arthralgia.  SHe is also hypothyroid, and has been non-compliant on her synthroid  Admitted on 07/05/2013 with acute abdominal pain with findings of perforated jejunal ulcer. S/p emergent exploratory laparotomy 1/4, Ulcer noted nxt to Lig of treitz c Bile spilling-placement 19 Fr Blake, insertion of gastric tube and repair of the jejunal perforation c Phillip Heal Patch Perf Jejunum  Postop she required intubation for several days-Hypotensive c shock-initially thought septic<<hypovolemic. Transfused Multiple x's Developed Afib ~07/10/13, transitioned to PO cardizem  Patient developed an intra-abdominal abscess 1/14 requiring IR percutaneous drainage on 07/16/13.  The cultures grew klebsiella pneumonia which was sensitive to imipenem. Patient was treated with a course of imipenem and switched to oral antibiotics.  Had to transition to IV ertapenem/Diflucan Hospital course complicated by development of bilateral pleural effusions, not responsive to IV lasix, Hospitalist service consulted 1/23 for medical assistance Thoracocentesis performed 07/24/12=NGx 1 day Blood cult x 2 1/23 pending Had diarrhea  1/24-CDIFF neg, GI pathogen panel pending CT chest 1/24 large R sided and new small right sided Pleural effusions Med/Onc + Palliative consulted for delineation of Goals    Past medical history-As per Problem list Chart reviewed as below- See above  Consultants:  CCM  IR  Med-Onc  Pallitaive  Procedures:  Multiple     Subjective  Looks a little brighter today. Smiling Feels very weak NO pain.  Has to be on 5 L [place] to maintain sats above 90% Undecided about Chest tube and is scared about other procedures.  Doesn't ever wish to be intubated again Really wishes to know when she can go home   Objective    Interim History: None   Telemetry: Afib   Objective: Filed Vitals:   07/26/13 0209 07/26/13 0500 07/26/13 0603 07/26/13 0855  BP:  105/48    Pulse:  105    Temp:  98.7 F (37.1 C)    TempSrc:  Oral    Resp:  24    Height:      Weight:      SpO2: 94% 87% 91% 92%    Intake/Output Summary (Last 24 hours) at 07/26/13 1323 Last data filed at 07/26/13 0516  Gross per 24 hour  Intake   1154 ml  Output   1615 ml  Net   -461 ml    Exam:  General: EOMI frail, somehwat SOb off o2 Cardiovascular:  s1 s2 no m/r/g Respiratory: diminished post lat Abdomen:  Soft, NT, ND Skinno Le edema.  MIdline abd scar covered c dressing  Data Reviewed: Basic Metabolic Panel:  Recent Labs Lab 07/21/13 0418 07/24/13 0306 07/26/13 0759  NA 133* 133* 130*  K 3.9 4.2 4.4  CL 100 93* 90*  CO2 _0 GLUCOSE 99 98 121*  BUN 4* 9 11  CREATININE 0.48* 0.62 0.55  CALCIUM 6.7* 7.0* 7.4*  PHOS  --   --  3.2   Liver Function Tests:  Recent Labs Lab 07/24/13 1925 07/26/13 0759  AST 15  --   ALT 7  --   ALKPHOS 141*  --   BILITOT <0.2*  --   PROT 5.8*  --   ALBUMIN 1.5* 1.3*   No results found for this basename: LIPASE, AMYLASE,  in the last 168 hours No results found for this basename: AMMONIA,  in the last 168 hours CBC:  Recent Labs Lab  07/21/13 0418 07/23/13 0328 07/24/13 0306 07/25/13 0518 07/26/13 0759  WBC 16.4* 18.6* 20.4* 32.1* 24.1*  NEUTROABS  --   --   --   --  21.7*  HGB 8.4* 7.6* 7.8* 7.6* 7.0*  HCT 24.4* 22.7* 23.1* 22.3* 20.0*  MCV 81.3 83.5 81.3 80.5 81.3  PLT 1079* 1256* 1263* 1247* 1130*   Cardiac Enzymes: No results found for this basename: CKTOTAL, CKMB, CKMBINDEX, TROPONINI,  in the last 168 hours BNP: No components found with this basename: POCBNP,  CBG:  Recent Labs Lab 07/25/13 1150 07/25/13 1750 07/25/13 2035 07/26/13 0833 07/26/13 1156  GLUCAP 159* 112* 134* 105* 116*    Recent Results (from the past 240 hour(s))  CULTURE, ROUTINE-ABSCESS     Status: None   Collection Time    07/16/13  3:32 PM      Result Value Range Status   Specimen Description ABSCESS PERITONEAL CAVITY   Final   Special Requests NONE   Final   Gram Stain     Final   Value: FEW WBC PRESENT,BOTH PMN AND MONONUCLEAR     NO SQUAMOUS EPITHELIAL CELLS SEEN     NO ORGANISMS SEEN     Performed at [facility]   Culture     Final   Value: FEW KLEBSIELLA PNEUMONIAE     Performed at [facility]   Report Status 07/20/2013 FINAL   Final   Organism ID, Bacteria KLEBSIELLA PNEUMONIAE   Final  CULTURE, EXPECTORATED SPUTUM-ASSESSMENT     Status: None   Collection Time    07/23/13 10:22 AM      Result Value Range Status   Specimen Description SPUTUM   Final   Special Requests Normal   Final   Sputum evaluation     Final   Value: MICROSCOPIC FINDINGS SUGGEST THAT THIS SPECIMEN IS NOT REPRESENTATIVE OF LOWER RESPIRATORY SECRETIONS. PLEASE RECOLLECT.     CALLED TO V CAIN,RN 07/23/13 1150 BY K SCHULTZ   Report Status 07/23/2013 FINAL   Final  BODY FLUID CULTURE     Status: None   Collection Time    07/24/13  3:57 PM      Result Value Range Status   Specimen Description PLEURAL FLUID LEFT   Final   Special Requests Normal   Final   Gram Stain     Final   Value: NO SQUAMOUS EPITHELIAL CELLS SEEN      MODERATE WBC PRESENT, PREDOMINANTLY PMN     NO ORGANISMS SEEN     Performed at [facility]   Culture     Final   Value: NO GROWTH 1 DAY     Performed at [facility]   Report Status PENDING   Incomplete  CLOSTRIDIUM DIFFICILE BY PCR     Status: None   Collection Time    07/25/13  3:41 PM      Result Value Range  Status   C difficile by pcr NEGATIVE  NEGATIVE Final     Studies:              All Imaging reviewed and is as per above notation   Scheduled Meds: . aspirin  325 mg Oral Daily  . azithromycin  250 mg Per Tube Daily  . busPIRone  5 mg Oral BID  . diltiazem  60 mg Per Tube Q6H  . ertapenem  1 g Intravenous Q24H  . ferrous sulfate  300 mg Oral TID WC  . fluconazole  200 mg Oral Daily  . folic acid  1 mg Oral Daily  . furosemide  80 mg Intravenous BID  . heparin subcutaneous  5,000 Units Subcutaneous Q8H  . insulin aspart  0-15 Units Subcutaneous Q4H  . ipratropium-albuterol  3 mL Nebulization TID  . [START ON 07/27/2013] levothyroxine  100 mcg Oral QAC breakfast  . losartan  25 mg Oral Daily  . Menthol-Zinc Oxide  1 application Topical BID  . multivitamin  5 mL Oral Daily  . nystatin  5 mL Oral QID  . pantoprazole sodium  40 mg Per Tube BID  . potassium chloride  40 mEq Oral Daily  . thiamine  100 mg Oral Daily  . vancomycin  500 mg Intravenous Q12H   Continuous Infusions: . sodium chloride 20 mL/hr (07/25/13 0942)  . feeding supplement (OSMOLITE 1.2 CAL) 1,000 mL (07/26/13 0953)      D#21 antibiotics           Zosyn 1/3->1/12           Diflucan1/4->           Augmentin 1/13-1/13           Invanz 1/12->1/20, then 1/23-->           Cipro 1/21->1/23           Vancomycin 1/23-->           Azithro 1/24   Assessment/Plan:        Hypoxic respiratory failure 2/2 to Bilateral pleural effusions/ pneumonia1 -Patient started back on IV Invanz . Continue vancomycin started 1/23 .  Continue antifungals -Light's criterion is Transudative, but has 8,000  WBC's-Would Rx as infected Possible exudate -Blood cultures and Pleural fluid cultures can guide therapy--Pending from 1/23 -desats to 85% on RA-now Oxygen dependant -continue IV lasix-Increased dose to 80 IV bid.  Net + 5.5 liters so far this admission-unlikely this will make a difference-Effusion might be a loculated abcess?? Patient still deciding on whether she wants a chest tube.  Family understands this is not only for comfort, but patient might decompnesate from this -Rpt Echo scheduled-Last echo 1/7 EF 35% -?h/o exposure to Pertussis?  Unclear if this is driving anything. Leukocytosis  -Possibly in the setting of underlying infection. Monitor closely. -CDIFF neg.  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 platelet 1300s now.  -Likely reactive thrombocytosis which is multifactorial due to underlying malignancy, infection/inflammation, iron deficiency anemia.  -Continue aspirin.  -Hemological anomalies are 2/2 to sepsis physiology/ Oncology issues Perforated jejunal ulcer, s/p surgery 07/05/13 -Status post exploratory laparotomy, repair of radial digital ulcer with omental patch and gastrostomy tube placement.  -Continue Protonix  -Per Gen surgery Afib with RVR   -Required Cardizem gtt, now rate on oral Cardizem. Will check TSH . Monitor on telemetry.  -Will need consideration of AC  Cardiomyopathy  -Again this is new finding during this hospitalization.  -2-D echo on 1/7 showing EF of 35-40% with akinesia.  -Appears likely to be ischemic vs ?etoh related..  -Continue Lasix. I will add on ARB. ( Allergy to ACE inhibitor due to cough)  Hypothyroidism  -Continue Synthroid. Increased from 50-->100 mcg 1/25.  Likely related to sepsis phsyiology, and doubt this will make a huge difference -Consider checking free am cortisol Severe  Protein-energy-malnutrition in settign of Critical illness/dysphagia 2/2 to XRT -defer to gen surgery/'nutritionist feeds History of laryngeal cancer and breast cancer  -History of squamous cell carcinoma of pyriform sinus s/p Chemoradiation.  -History of invasive right ductal carcinoma status post lumpectomy and chemotherapy.  -Follows with Dr Alvy Bimler.    Code Status: Full Family Communication: discussed with both patient/partner and Dr. Rosendo Gros + for discussion Disposition Plan: unknown   >35 min  Verneita Griffes, MD  [facility] Pager [phone] 07/26/2013, 1:23 PM    LOS: 22 days

## 2013-07-27 ENCOUNTER — Telehealth: Payer: Self-pay | Admitting: Internal Medicine

## 2013-07-27 DIAGNOSIS — R109 Unspecified abdominal pain: Secondary | ICD-10-CM

## 2013-07-27 LAB — BORDETELLA PERTUSSIS PCR
B parapertussis, DNA: NOT DETECTED
B pertussis, DNA: NOT DETECTED

## 2013-07-27 LAB — GI PATHOGEN PANEL BY PCR, STOOL
C difficile toxin A/B: NEGATIVE
Campylobacter by PCR: NEGATIVE
Cryptosporidium by PCR: NEGATIVE
E COLI (ETEC) LT/ST: NEGATIVE
E coli (STEC): NEGATIVE
E coli 0157 by PCR: NEGATIVE
G lamblia by PCR: NEGATIVE
Norovirus GI/GII: NEGATIVE
ROTAVIRUS A BY PCR: NEGATIVE
SALMONELLA BY PCR: NEGATIVE
Shigella by PCR: NEGATIVE

## 2013-07-27 MED ORDER — LEVOTHYROXINE SODIUM 100 MCG PO TABS: 100.0000 ug | ORAL_TABLET | Freq: Every day | ORAL | Status: DC

## 2013-07-27 MED ORDER — AZITHROMYCIN 200 MG/5ML PO SUSR: 250.0000 mg | Freq: Every day | ORAL | Status: AC

## 2013-07-27 MED ORDER — FERROUS SULFATE 300 (60 FE) MG/5ML PO SYRP: 300.0000 mg | ORAL_SOLUTION | Freq: Three times a day (TID) | ORAL | Status: DC

## 2013-07-27 MED ORDER — IPRATROPIUM-ALBUTEROL 0.5-2.5 (3) MG/3ML IN SOLN: 3.0000 mL | RESPIRATORY_TRACT | Status: AC | PRN

## 2013-07-27 MED ORDER — FUROSEMIDE 40 MG PO TABS: 40.0000 mg | ORAL_TABLET | Freq: Two times a day (BID) | ORAL | Status: DC

## 2013-07-27 MED ORDER — AMOXICILLIN-POT CLAVULANATE 875-125 MG PO TABS: 1.0000 | ORAL_TABLET | Freq: Two times a day (BID) | ORAL | Status: DC

## 2013-07-27 MED ORDER — LOSARTAN POTASSIUM 25 MG PO TABS: 25.0000 mg | ORAL_TABLET | Freq: Every day | ORAL | Status: DC

## 2013-07-27 MED ORDER — OXYCODONE HCL 20 MG/ML PO CONC: ORAL | Status: AC

## 2013-07-27 NOTE — Progress Notes (Signed)
Noted events overnight and today  Discussed with Gen surgery empiric meds Palliative care to recommend meds for a Hopsice/Comfort approach. Condolences expressed to patient and [name], s/o. Please contact me if any q's.  Thanks for this consult]  Verneita Griffes, MD [facility] [phone]

## 2013-07-27 NOTE — Progress Notes (Signed)
Pt family member refused breathing tx at this time for pt due to pt receiving a breathing tx at 06:30 this morning.

## 2013-07-27 NOTE — Progress Notes (Signed)
Patient is not seen. She was seen by my colleague, Dr. Juliann Mule over the weekend. Noted the patient's plans to go with hospice. I will leave her a return appointment that was scheduled before as routine visit for April with the option that the patient can continue follow-up at the [facility] in the future if needed. Will sign off.

## 2013-07-27 NOTE — Progress Notes (Signed)
Patient HA:[id number] A Fincher      DOB: 1946-05-15      OXB:[id number]   Palliative Medicine Team at [facility] Progress Note    Subjective: [name] was seen and examined up in the chair.  She gets very short of breath with movement.  They are anxious to go home.  I reviewed meds with surgical PA.  We have provided a completed most form indicating full comfort, no return to hospital , DNR, use PEG until they decide not to do so and oral antibiotics.    Filed Vitals:   07/27/13 0638  BP:   Pulse: 106  Resp: 22   Physical exam:    General: Cachetic, short of breath. PERRL, EOMI, anicteric mm dry Chest bilateral coarse wet rhonchi CVS; tachy S1, S2 Abd: firm dressing in place positive bowel sounds Ext cachectic,  Neuro: awake, intermittently overwhelmed and confused.    Assessment and plan: 68 yr old with history of laryngeal and breast cancer admitted with jejunal perforation s/p patch repair.  Patient continues to have respiratory failure secondary to pleural effusion for which she does not want pleurx catheter place.  She will be discharged to  Home with hospice care.  Unclear to me which cancer is driving this.  1.  DNR. MOST form completed  2.  Dyspnea and pain:  Use OxyIR 20 mg/ml 5 mg SL. q 2 hrs prn . Titrate for comfort.  3.  Prognosis: days- week  Disposition: to home with [facility] .     Total time 82  m in  [place] L. Lovena Le, MD MBA The Palliative Medicine Team at [facility] Phone: [phone] Pager: [phone]

## 2013-07-27 NOTE — Progress Notes (Signed)
OT Cancellation Note  Patient Details Name: Taylor [name] MRN: [id number] DOB: [birth year]   Cancelled Treatment:    Reason Eval/Treat Not Completed: Fatigue/lethargy limiting ability to participate. Pt refused and states she is exhausted.   Benito Mccreedy OTR/L [phone] 07/27/2013, 12:12 PM

## 2013-07-27 NOTE — Progress Notes (Signed)
Pt seen at request of CM RN Heather for home with hospice - chose [facility].   Pt approved by Medical Director [facility].   Pt to be discharged home today with [place] admission RN to see pt tonight when pt got home.   Only DME requested was 02 @ 5 L [place].  Pt will need Wound dressings, foley care.   Pt has shipment from [facility] for Ensure, rolling walker and BSC.  Pt refused hospital bed.   MOST and DNR completed for pt at discharge.   Please call [facility] @ [phone]- ask for RN Liaison or after hours,ask for on-call RN with any hospice needs.   Thank you.  Vista Lawman, RN  [facility]  Hospice Liaison  [phone])

## 2013-07-27 NOTE — Progress Notes (Signed)
ANTIBIOTIC CONSULT NOTE - Follow-up  Pharmacy Consult for vancomycin Indication: bilateral pneumonia  Allergies  Allergen Reactions  . Ace Inhibitors Cough  . Hydrocodone Itching  . Hydromorphone Itching  . Tylox [Oxycodone-Acetaminophen] Itching  . Sulfa Antibiotics Itching and Rash    Patient Measurements: Height: 5' 4.17" (163 cm) Weight: 114 lb 9.6 oz (51.982 kg) IBW/kg (Calculated) : 55.1  Vital Signs: Pulse Rate: 106 (01/26 0638) Intake/Output from previous day: 01/25 0701 - 01/26 0700 In: -  Out: 1650 [Urine:1650] Intake/Output from this shift: Total I/O In: 120 [P.O.:120] Out: 1000 [Urine:1000]  Labs:  Recent Labs  07/25/13 0518 07/26/13 0759  WBC 32.1* 24.1*  HGB 7.6* 7.0*  PLT 1247* 1130*  CREATININE  --  0.55   Estimated Creatinine Clearance: 56 ml/min (by C-G formula based on Cr of 0.55). No results found for this basename: VANCOTROUGH, Corlis Leak, VANCORANDOM, GENTTROUGH, GENTPEAK, GENTRANDOM, TOBRATROUGH, TOBRAPEAK, TOBRARND, AMIKACINPEAK, AMIKACINTROU, AMIKACIN,  in the last 72 hours   Microbiology: Recent Results (from the past 720 hour(s))  MRSA PCR SCREENING     Status: None   Collection Time    07/05/13  7:29 AM      Result Value Range Status   MRSA by PCR NEGATIVE  NEGATIVE Final   Comment:            The GeneXpert MRSA Assay (FDA     approved for NASAL specimens     only), is one component of a     comprehensive MRSA colonization     surveillance program. It is not     intended to diagnose MRSA     infection nor to guide or     monitor treatment for     MRSA infections.  CLOSTRIDIUM DIFFICILE BY PCR     Status: None   Collection Time    07/10/13  9:00 AM      Result Value Range Status   C difficile by pcr NEGATIVE  NEGATIVE Final  CULTURE, ROUTINE-ABSCESS     Status: None   Collection Time    07/16/13  3:32 PM      Result Value Range Status   Specimen Description ABSCESS PERITONEAL CAVITY   Final   Special Requests NONE    Final   Gram Stain     Final   Value: FEW WBC PRESENT,BOTH PMN AND MONONUCLEAR     NO SQUAMOUS EPITHELIAL CELLS SEEN     NO ORGANISMS SEEN     Performed at [facility]   Culture     Final   Value: FEW KLEBSIELLA PNEUMONIAE     Performed at [facility]   Report Status 07/20/2013 FINAL   Final   Organism ID, Bacteria KLEBSIELLA PNEUMONIAE   Final  CULTURE, EXPECTORATED SPUTUM-ASSESSMENT     Status: None   Collection Time    07/23/13 10:22 AM      Result Value Range Status   Specimen Description SPUTUM   Final   Special Requests Normal   Final   Sputum evaluation     Final   Value: MICROSCOPIC FINDINGS SUGGEST THAT THIS SPECIMEN IS NOT REPRESENTATIVE OF LOWER RESPIRATORY SECRETIONS. PLEASE RECOLLECT.     CALLED TO V CAIN,RN 07/23/13 1150 BY K SCHULTZ   Report Status 07/23/2013 FINAL   Final  BODY FLUID CULTURE     Status: None   Collection Time    07/24/13  3:57 PM      Result Value Range Status   Specimen Description  PLEURAL FLUID LEFT   Final   Special Requests Normal   Final   Gram Stain     Final   Value: NO SQUAMOUS EPITHELIAL CELLS SEEN     MODERATE WBC PRESENT, PREDOMINANTLY PMN     NO ORGANISMS SEEN     Performed at [facility]   Culture     Final   Value: NO GROWTH 3 DAYS     Performed at [facility]   Report Status PENDING   Incomplete  CULTURE, BLOOD (ROUTINE X 2)     Status: None   Collection Time    07/24/13  7:25 PM      Result Value Range Status   Specimen Description BLOOD LEFT HAND   Final   Special Requests BOTTLES DRAWN AEROBIC AND ANAEROBIC 10CC   Final   Culture  Setup Time     Final   Value: 07/25/2013 01:09     Performed at [facility]   Culture     Final   Value:        BLOOD CULTURE RECEIVED NO GROWTH TO DATE CULTURE WILL BE HELD FOR 5 DAYS BEFORE ISSUING A FINAL NEGATIVE REPORT     Performed at [facility]   Report Status PENDING   Incomplete  CULTURE, BLOOD (ROUTINE X 2)     Status: None    Collection Time    07/24/13  7:35 PM      Result Value Range Status   Specimen Description BLOOD LEFT ARM   Final   Special Requests BOTTLES DRAWN AEROBIC AND ANAEROBIC 10CC   Final   Culture  Setup Time     Final   Value: 07/25/2013 01:10     Performed at [facility]   Culture     Final   Value:        BLOOD CULTURE RECEIVED NO GROWTH TO DATE CULTURE WILL BE HELD FOR 5 DAYS BEFORE ISSUING A FINAL NEGATIVE REPORT     Performed at [facility]   Report Status PENDING   Incomplete  BORDETELLA PERTUSSIS PCR     Status: None   Collection Time    07/25/13  2:33 PM      Result Value Range Status   B pertussis, DNA NOT DETECTED   Final   B parapertussis, DNA NOT DETECTED   Final   Comment: (NOTE)     ** Normal Reference Range for each Analyte: Not Detected **          The performance characteristics of this assay were determined by     [facility]. This test has not been cleared or approved by     the U.S. Food and Drug Administration (FDA) for this specimen type.     The FDA has determined that such clearance or approval is not     necessary. This test is used for clinical purposes. It should be     regarded as investigational or for research. This laboratory is     certified under the [place]     (CLIA-88) as qualified to perform high complexity testing.     Performed at [facility] DIFFICILE BY PCR     Status: None   Collection Time    07/25/13  3:41 PM      Result Value Range Status   C difficile by pcr NEGATIVE  NEGATIVE Final  STOOL CULTURE  Status: None   Collection Time    07/25/13  4:54 PM      Result Value Range Status   Specimen Description STOOL   Final   Special Requests NONE   Final   Culture     Final   Value: Culture reincubated for better growth     Performed at [facility]   Report Status PENDING   Incomplete    Medical History: Past Medical History   Diagnosis Date  . Hyperlipidemia     STATES SHE NO LONGER HAS HI CHOLESTEROL  . Rhinitis   . Fatigue 12/10/2011  . Hypothyroid 12/11/2011  . Hypertension     hx of, no current med for last 3 years  . Throat cancer 2010  . Breast CA 2008    right breast  . Headache(784.0)     tension  . Nausea alone 04/01/2013  . Difficult airway     Assessment: 68 y/o female admitted for perforated jejunal ulcer s/p ex lap, insertion of gastric tube, and repair of jejunal ulcer perforation. She finished a course of antibiotics for a Klebsiella intrabdominal abscess.  Pt continues on fluconazole D#22, Ertapenem and Vanconmycin D#4 for perf bowel, PNA. Azithromycin D#3 for r/o pertussis. Afebrile, WBC 24.1- trending down. Noted plans home on po abx with hospice.  Zosyn 1/14>>1/13 Augmentin 1/13>>1/14 Flucon 1/4>> Cipro 1/21>>1/23 Erta 1/14>>1/21; 1/23>> Vanc 1/23>> Azithro 1/24>>  1/24 Cdiff - NEG 1/23 Bld x2- ngtd 1/24 Pertussis PCR - pending 1/23 Pleural fluid - NGTD 1/15 Peritoneal abscess - kleb pneumo 1/9 Cdiff - NEG 1/4 MRSA - NEG  Goal of Therapy:  Vancomycin trough level 15-20 mcg/ml  Plan:  Noted plan home on Augmentin, azithromycin, and Diflucan and hospice care.  Sherlon Handing, PharmD, BCPS Clinical pharmacist, pager [phone] 07/27/2013 2:42 PM

## 2013-07-27 NOTE — Progress Notes (Signed)
PT Cancellation Note  Patient Details Name: Taylor [name] MRN: [id number] DOB: 04/22/1946   Cancelled Treatment:    Reason Eval/Treat Not Completed: Other (comment)  Noted Goals of Care are focused on comfort, and pt is to dc today  She had refused OT earlier today  She has ambulance transport home, and they can assist her up the stairs  No further acute PT needs identified  Thanks,  Roney Marion, [place]    [place] 07/27/2013, 2:18 PM

## 2013-07-27 NOTE — Progress Notes (Signed)
Chaplain offered ministry of presence, emotional and spiritual support to patient/partner Taylor [name]). Taylor [name] said "patient getting ready to go home today." Patient said "she was [place]." Patient and Nobie Putnam the chaplain for visiting.   07/27/13 1100  Clinical Encounter Type  Visited With Patient;Other (Comment) (Partner)  Visit Type Initial

## 2013-07-27 NOTE — Progress Notes (Signed)
Pt to be d/c today to Home with Hospice.   Pt and family agreeable.   Plan transfer via EMS.    [facility]  [address];  [phone] Phone: [phone]

## 2013-07-27 NOTE — Progress Notes (Signed)
The patient has decided to go the hospice route at this point.  She does not want any further lab or xray testing, tubes, lines etc.  She will continue with dressing changes, g-tube feeds with ensure only, PO/liquid antibiotics, PO as tolerated, oxygen therapy and nebulizer treatments, and pain control.  Oval arrangements will be switched to hospice.  They would like to be discharged today.  She already has [facility] equipment and ensure at home.  Palliative care seeing the patient now.  Dr. Verlon Au recommending continuing home meds and Augmentin for pulm source which will also cover intraabdominal sources.  The IR drain will remain in place.  If at some point it needs to have it removed likely hospice nursing can help with that.  If you cut off some from the end and keep a hold to the part that enters the abdomen it will release the pigtail and allow the tube to be removed.

## 2013-07-27 NOTE — Discharge Instructions (Signed)
[facility]      [facility] °[phone] ° °OPEN ABDOMINAL SURGERY: POST OP INSTRUCTIONS ° °Always review your discharge instruction sheet given to you by the facility where your surgery was performed. ° °IF YOU HAVE DISABILITY OR FAMILY LEAVE FORMS, YOU MUST BRING THEM TO THE OFFICE FOR PROCESSING.  PLEASE DO NOT GIVE THEM TO YOUR DOCTOR. ° °1. A prescription for pain medication may be given to you upon discharge.  Take your pain medication as prescribed, if needed.  If narcotic pain medicine is not needed, then you may take acetaminophen (Tylenol) or ibuprofen (Advil) as needed. °2. Take your usually prescribed medications unless otherwise directed. °3. If you need a refill on your pain medication, please contact your pharmacy. They will contact our office to request authorization.  Prescriptions will not be filled after 5pm or on week-ends. °4. You should follow a light diet the first few days after arrival home, such as soup and crackers, pudding, etc.unless your doctor has advised otherwise. A high-fiber, low fat diet can be resumed as tolerated.   Be sure to include lots of fluids daily. Most patients will experience some swelling and bruising on the chest and neck area.  Ice packs will help.  Swelling and bruising can take several days to resolve °5. Most patients will experience some swelling and bruising in the area of the incision. Ice pack will help. Swelling and bruising can take several days to resolve..  °6. It is common to experience some constipation if taking pain medication after surgery.  Increasing fluid intake and taking a stool softener will usually help or prevent this problem from occurring.  A mild laxative (Milk of Magnesia or Miralax) should be taken according to package directions if there are no bowel movements after 48 hours. °7.  You may have steri-strips (small skin tapes) in place directly over the incision.  These strips should be left on the skin for 7-10 days.  If your  surgeon used skin glue on the incision, you may shower in 24 hours.  The glue will flake off over the next 2-3 weeks.  Any sutures or staples will be removed at the office during your follow-up visit. You may find that a light gauze bandage over your incision may keep your staples from being rubbed or pulled. You may shower and replace the bandage daily. °8. ACTIVITIES:  You may resume regular (light) daily activities beginning the next day--such as daily self-care, walking, climbing stairs--gradually increasing activities as tolerated.  You may have sexual intercourse when it is comfortable.  Refrain from any heavy lifting or straining until approved by your doctor. °a. You may drive when you no longer are taking prescription pain medication, you can comfortably wear a seatbelt, and you can safely maneuver your car and apply brakes °b. Return to Work: ___________________________________ °9. You should see your doctor in the office for a follow-up appointment approximately two weeks after your surgery.  Make sure that you call for this appointment within a day or two after you arrive home to insure a convenient appointment time. °OTHER INSTRUCTIONS:  °_____________________________________________________________ °_____________________________________________________________ ° °WHEN TO CALL YOUR DOCTOR: °1. Fever over 101.0 °2. Inability to urinate °3. Nausea and/or vomiting °4. Extreme swelling or bruising °5. Continued bleeding from incision. °6. Increased pain, redness, or drainage from the incision. °7. Difficulty swallowing or breathing °8. Muscle cramping or spasms. °9. Numbness or tingling in hands or feet or around lips. ° °The clinic staff is available to   answer your questions during regular business hours.  Please dont hesitate to call and ask to speak to one of the nurses if you have concerns.  For further questions, please visit [email or web address]   Dressing Change A dressing is a  material placed over wounds. It keeps the wound clean, dry, and protected from further injury. This provides an environment that favors wound healing.  BEFORE YOU BEGIN  Get your supplies together. Things you may need include:  Saline solution.  Flexible gauze dressing.  Medicated cream.  Tape.  Gloves.  Abdominal dressing pads.  Gauze squares.  Plastic bags.  Take pain medicine 30 minutes before the dressing change if you need it.  Take a shower before you do the first dressing change of the day. Use plastic wrap or a plastic bag to prevent the dressing from getting wet. REMOVING YOUR OLD DRESSING   Wash your hands with soap and water. Dry your hands with a clean towel.  Put on your gloves.  Remove any tape.  Carefully remove the old dressing. If the dressing sticks, you may dampen it with warm water to loosen it, or follow your caregiver's specific directions.  Remove any gauze or packing tape that is in your wound.  Take off your gloves.  Put the gloves, tape, gauze, or any packing tape into a plastic bag. CHANGING YOUR DRESSING  Open the supplies.  Take the cap off the saline solution.  Open the gauze package so that the gauze remains on the inside of the package.  Put on your gloves.  Clean your wound as told by your caregiver.  If you have been told to keep your wound dry, follow those instructions.  Your caregiver may tell you to do one or more of the following:  Pick up the gauze. Pour the saline solution over the gauze. Squeeze out the extra saline solution.  Put medicated cream or other medicine on your wound if you have been told to do so.  Put the solution soaked gauze only in your wound, not on the skin around it.  Pack your wound loosely or as told by your caregiver.  Put dry gauze on your wound.  Put abdominal dressing pads over the dry gauze if your wet gauze soaks through.  Tape the abdominal dressing pads in place so they will not  fall off. Do not wrap the tape completely around the affected part (arm, leg, abdomen).  Wrap the dressing pads with a flexible gauze dressing to secure it in place.  Take off your gloves. Put them in the plastic bag with the old dressing. Tie the bag shut and throw it away.  Keep the dressing clean and dry until your next dressing change.  Wash your hands. SEEK MEDICAL CARE IF:  Your skin around the wound looks red.  Your wound feels more tender or sore.  You see pus in the wound.  Your wound smells bad.  You have a fever.  Your skin around the wound has a rash that itches and burns.  You see black or yellow skin in your wound that was not there before.  You feel nauseous, throw up, and feel very tired. Document Released: 07/26/2004 Document Revised: 09/10/2011 Document Reviewed: 04/30/2011 [facility] Patient Information 2014 [place], [place].

## 2013-07-27 NOTE — Telephone Encounter (Signed)
Taylor [name] at [facility] would like to know if Dr. Regis Bill will be the attending for the pt and if she would like one of their doctor's to assist in management?  Please call

## 2013-07-27 NOTE — Telephone Encounter (Signed)
Spoke to [place].  Informed her that [facility] has not seen the pt since 2012.  Asked if the patient has another PCP.  Reginold Agent will check into other physicians and will call back if necessary.

## 2013-07-27 NOTE — Discharge Summary (Signed)
Physician Discharge Summary  Taylor [name] [id number] DOB: 12-Aug-1945 DOA: 07/04/2013  PCP: Lottie Dawson, MD  Consultation: Critical Care(Dr. Lake Bells)    Internal Medicine(Dr. Dhungel/Samtani)    Palliative Care(Dr. Lovena Le)    Hematology/Oncology (Dr. Chism/Dr. Alvy Bimler)   Admit date: 07/04/2013 Discharge date: 07/27/2013  Recommendations for Outpatient Follow-up:   1. Dr. Rolm Bookbinder if applicable 2. Hospice   Discharge Diagnoses:  1. Perforated Jejunal Ulcer 2. Hypovolemic shock 3. Intra-abdominal abscesses 4. Leukocytosis 5. Thrombocytosis 6. Pleural effusions/respiratory failure 7. Pneumonia 8. Protein calorie malnutrition 9. Hypoalbuminemia 10. Hypokalemia 11. Atrial fibrillation with RVR 12. Anemia of chronic disease 13. Hypothyroidism 14. Hx ETOH abuse 15. Hx laryngeal and breast cancer    Surgical Procedure: Exploratory laparotomy, insertion of gastric tube, repair of jejunal ulcer perforation (Dr. Donne Hazel 07/05/13)    Discharge Condition: guarded  Disposition: home with hospice   Diet recommendation: as tolerated   Filed Weights   07/23/13 0500 07/24/13 0554 07/25/13 0500  Weight: 120 lb (54.432 kg) 114 lb 14.4 oz (52.118 kg) 106 lb 8 oz (48.308 kg)    Filed Vitals:   07/27/13 0638  BP:   Pulse: 106  Resp: 22    Follow-up Information   Schedule an appointment as soon as possible for a visit with Rolm Bookbinder, MD. (As needed)    Specialty:  General Surgery   Contact information:   [address] [place] [address] [phone]       Schedule an appointment as soon as possible for a visit with [facility], NI, MD. (As needed)    Specialty:  Hematology and Oncology   Contact information:   [place] [address] [phone]       Schedule an appointment as soon as possible for a visit with Lottie Dawson, MD. (As needed)    Specialty:  Internal Medicine   Contact information:   [place] [address] [phone]       Schedule an appointment as soon as possible for a visit with HOSS,ART A, MD. (As needed)    Specialty:  Interventional Radiology   Contact information:   [place] [place] [address] [phone]       Hospital Course:  Taylor [name] is a 68 year old female who presented to [facility] with a perforated jejunal ulcer.  She underwent emergent exploratory laparotomy.  Upon intubation the patient briefly cardiac arrested.  It was difficult to intubate her.  She was kept intubated for several days due to above concerns, PMH and hypovolemic shock.  Her course is as follows:   1.    Perforated Jejunal Ulcer/peritonitis-repair of perforated jejunal ulcer on 1/4.  A JP drain was placed as well as a gastric tube considering her history.  She remained intubated, NGT in place, on TNA.  She was maintained on IV antibiotics.   UGI was done on POD#7 which was normal and she was started on clears.  On POD#9 she was transitioned to oral Augmentin.  The following day white count increased to 23K.  A repeat CT scan revealed a left upper subphrenic and a small bowel mesenteric abscess.  IR was consulted for additional perc drain placement.  She was changed back to invanz.  Abscess cultures showed klebsiella pneumoniae.  Repeat CT 1/20 showed a improvement in abscesses.  IR was unable to drain subphrenic drain due to concerns for developing fistula.  She was  once again transitioned to oral antibiotics, however, white count increased and therefore changed back.  The decision was made to go home with hospice.  Therefore. She will be discharged with Augmentin and the IR perc drain.  BID wet to dry dressing changes which her partner will be able to do.   2.    Hypovolemic shock-treated with IV fluids, blood transfusion and vasopressors.  Resolved.   3.    Intra-abdominal abscesses-refer to #1.  Ongoing, she will  continue with drain and antibiotics.   4.    Leukocytosis-2/2 #1.  Additionally she had pneumonia for which she was treated for with IV antibiotics.   5.    Thrombocytosis-multifactorial, she was started on aspirin.  We asked internal medicine to evaluate the patient.  Additionally hematology agreed with continued use of aspirin. 6.    Pleural effusions/respiratory failure-she was kept inbubated, diuresed in ICU, oxygen, pulmonary toilet.  Initially effusions were thought to be reactive from surgery.  The pleural effusions remained, she became symptomatic with low 02 sats and increased oxygen demand.  She was started on IV lasix.  IR performed a thoracentesis on the left dated 1/23.  Hospitalist service enlisted to help manage this, recommended chest tube placement.  However, the patient declined.  She declined additional treatment.  Palliative Care was then consulted because the patient wished to go home with any treatment.  It was then decided to proceed with full hospice care at home. This was discussed with the patient and her partner [name].  She will have home 02, nebs, pain medication, foley catheter for comfort care.  She understands the end result of refusing care and is able to make this decision on her own.  The IR drain can be discontinued at any time when the patient/partner deems it unnecessary, but for now they would like to keep it to prevent re-accumulation.  IR department can be consulted to help with removal of this pig-tail catheter.   7.    Pneumonia-completed course of antibiotics.   8.    Protein calorie malnutrition-she refused cyclical TF's at discharge.  She will eat as tolerated, ensure TID as bolus TF, resource breeze meds per tube.   9.    Hypoalbuminemia-throughout her hospitalization she was on TPN, transitioned to oral, tube feeds.   10.  Hypokalemia-supplemented, started on PO potassium which she was maintained on throughout her hospital course.   11.  Atrial fibrillation with  RVR-started on IV cardizem and transitioned to PO, rate controlled.  We attempted to change to CD, however, the patient could not swallow the capsule and therefore it was changed back.  This remained stable throughout the hospitalization.   12.  Anemia of chronic disease-she was initially transfused, remained stable throughout.   13.  Hypothyroidism-TSH 33.  Her medication was increased to 100mg  QD.   14.  Hx ETOH abuse-monitored for withdrawal.  Continued MVI.   15.  Hx laryngeal and breast cancer    Physical Exam: Gen: Alert, NAD, pleasant  Card: RRR, no M/G/R heard  Pulm: Course breath sounds, effort intermittent, WOB noticeable, [place] on.  Abd: Soft, NT/ND, +BS, no HSM, dressing/wound clean, G tube clamped    Discharge Instructions   Future Appointments Provider Department Dept Phone   09/30/2013 9:00 AM [facility] [phone]   09/30/2013 9:30 AM Heath Lark, MD [facility] [phone]       Medication List    STOP  taking these medications       SUDAFED TRIPLE ACTION PO      TAKE these medications       acetaminophen 160 MG/5ML elixir  Commonly known as:  TYLENOL  Take 15 mg/kg by mouth every 4 (four) hours as needed for fever.     amoxicillin-clavulanate 875-125 MG per tablet  Commonly known as:  AUGMENTIN  Take 1 tablet by mouth 2 (two) times daily.     aspirin 325 MG tablet  Take 1 tablet (325 mg total) by mouth daily.     azithromycin 200 MG/5ML suspension  Commonly known as:  ZITHROMAX  Place 6.3 mLs (250 mg total) into feeding tube daily.     desoximetasone 0.25 % cream  Commonly known as:  TOPICORT  Apply 1 application topically 2 (two) times daily. Apply to affected area     diclofenac 75 MG EC tablet  Commonly known as:  VOLTAREN  Take 75 mg by mouth 2 (two) times daily.     diltiazem 10 mg/ml oral suspension  Commonly known as:  CARDIZEM  Place 6 mLs (60 mg total) into  feeding tube every 6 (six) hours.     feeding supplement (ENSURE COMPLETE) Liqd  Place 237 mLs into feeding tube 3 (three) times daily between meals.     feeding supplement (RESOURCE BREEZE) Liqd  Take 1 Container by mouth 2 (two) times daily with a meal.     ferrous sulfate 300 (60 FE) MG/5ML syrup  Take 5 mLs (300 mg total) by mouth 3 (three) times daily with meals.     fluconazole 200 MG tablet  Commonly known as:  DIFLUCAN  Take 1 tablet (200 mg total) by mouth daily.     furosemide 40 MG tablet  Commonly known as:  LASIX  Take 1 tablet (40 mg total) by mouth 2 (two) times daily.     ipratropium-albuterol 0.5-2.5 (3) MG/3ML Soln  Commonly known as:  DUONEB  Take 3 mLs by nebulization every 4 (four) hours as needed.     levothyroxine 100 MCG tablet  Commonly known as:  SYNTHROID, LEVOTHROID  Take 1 tablet (100 mcg total) by mouth daily before breakfast.     LORazepam 0.5 MG tablet  Commonly known as:  ATIVAN  Take 1 tablet (0.5 mg total) by mouth every 8 (eight) hours as needed for anxiety.     losartan 25 MG tablet  Commonly known as:  COZAAR  Take 1 tablet (25 mg total) by mouth daily.     magic mouthwash w/lidocaine Soln  Take 5 mLs by mouth 3 (three) times daily as needed for mouth pain.     magnesium hydroxide 800 MG/5ML suspension  Commonly known as:  MILK OF MAGNESIA  Take 15 mLs by mouth daily as needed for constipation.     multivitamin Liqd  Take 5 mLs by mouth daily.     nystatin 100000 UNIT/ML suspension  Commonly known as:  MYCOSTATIN  Take 5 mLs (500,000 Units total) by mouth 4 (four) times daily.     omeprazole 20 MG capsule  Commonly known as:  PRILOSEC  Take 1 capsule (20 mg total) by mouth daily.     ondansetron 8 MG tablet  Commonly known as:  ZOFRAN  Take 1 tablet (8 mg total) by mouth every 8 (eight) hours as needed for nausea.     oxyCODONE 20 MG/1ML concentrated solution  Commonly known as:  ROXICODONE INTENSOL  Oxyfast 20mg /ml 5 mg  SL  q 2 hours as needed for pain or dyspnea     oxymetazoline 0.05 % nasal spray  Commonly known as:  AFRIN  Place 1 spray into the nose 2 (two) times daily.     potassium chloride 20 MEQ/15ML (10%) solution  Take 30 mLs (40 mEq total) by mouth daily.     simethicone 125 MG chewable tablet  Commonly known as:  MYLICON  Chew 0000000 mg by mouth every 6 (six) hours as needed for flatulence.          The results of significant diagnostics from this hospitalization (including imaging, microbiology, ancillary and laboratory) are listed below for reference.    Significant Diagnostic Studies: Ct Abdomen Pelvis Wo Contrast  07/05/2013   CLINICAL DATA:  Abdominal pain and weakness.  Vomiting  EXAM: CT ABDOMEN AND PELVIS WITHOUT CONTRAST  TECHNIQUE: Multidetector CT imaging of the abdomen and pelvis was performed following the standard protocol without intravenous contrast.  COMPARISON:  02/21/2009  FINDINGS: BODY WALL: Unremarkable.  LOWER CHEST: Coronary atherosclerosis. Small right pleural effusion. Subpleural reticulation in the anterior right lung. There may be mild interstitial pulmonary edema at the bases.  ABDOMEN/PELVIS:  Liver: Granulomatous changes.  Biliary: Gallstones or sludge. No evidence of gallbladder inflammation.  Pancreas: Unremarkable.  Spleen: Granulomatous changes.  Adrenals: No acute findings.  Kidneys and ureters: No hydronephrosis or stone.  Bladder: Thick wall, which may be from decompressed state.  Reproductive: Calcified 3.3 cm uterine fibroid. There may be a small right adnexal cyst (approximately 12 mm).  Bowel: Bowel perforation with pneumoperitoneum and contrast spillage. The source appears to be the distal duodenum, which is circumferentially thickened, and may have diverticula. The greater curvature of the stomach also shows wall thickening, but appears intact.  Retroperitoneum: No mass or adenopathy.  Peritoneum: Moderate ascites, with free spillage of enteric contrast. The  contrast has accumulated in the left upper quadrant. Pneumoperitoneum.  Vascular: No acute abnormality.  OSSEOUS: No acute abnormalities. Advanced bilateral hip osteoarthritis, right worse than left.  Critical Value/emergent results were called by telephone at the time of interpretation on 07/05/2013 at 2:25 AM to Dr. Lita Mains , who verbally acknowledged these results.  IMPRESSION: 1. Perforated viscus, most likely the thickened distal duodenum. There is pneumoperitoneum and extraluminal enteric contents. 2. Gallstones or gallbladder sludge.   Electronically Signed   By: Jorje Guild M.D.   On: 07/05/2013 02:30   Dg Chest 1 View  07/24/2013   CLINICAL DATA:  Status post left-sided thoracentesis. Possible aspiration.  EXAM: CHEST - 1 VIEW  COMPARISON:  07/24/2013  FINDINGS: The cardiomediastinal silhouette is unchanged. Left pleural effusion has decreased in size following thoracentesis. Moderate right pleural effusion does not appear significantly changed. No pneumothorax is identified. Patchy bibasilar lung opacities do not appear significantly changed. Mild pulmonary vascular congestion is unchanged. Small calcified nodule in the left mid lung is unchanged. Surgical clips are noted in the right axilla. Tubing overlies the left upper quadrant.  IMPRESSION: 1. Decreased size of left pleural effusion following thoracentesis. No pneumothorax identified. 2. Unchanged appearance of moderate right pleural effusion. 3. Persistent bibasilar airspace opacities without significant interval change, which may reflect atelectasis or pneumonia.   Electronically Signed   By: [name] Bores   On: 07/24/2013 16:36   Dg Chest 2 View  07/24/2013   CLINICAL DATA:  Cough and congestion with known pleural effusions  EXAM: CHEST  2 VIEW  COMPARISON:  DG CHEST 1V PORT dated 07/23/2013  FINDINGS: The lungs are  adequately inflated. Coarse infrahilar lung markings bilaterally are present and may reflect atelectasis or pneumonia.  Moderate-sized bilateral pleural effusions are present and may have increased in volume on the right. There is a stable calcified nodule in the left mid lung. The cardiac silhouette is normal in size. The pulmonary vascularity is mildly prominent centrally but no definite cephalization is demonstrated. The mediastinum is normal in width. There surgical clips in the right axillary region.  IMPRESSION: 1. Moderate-sized bilateral pleural effusions are slightly more conspicuous today especially on the right. 2. Persistent bibasilar atelectasis or pneumonia is present. 3. There is no evidence of significant CHF.   Electronically Signed   By: David  Martinique   On: 07/24/2013 09:18   Ct Chest W Contrast  07/25/2013   CLINICAL DATA:  Breast carcinoma and throat carcinoma. Pleural effusions and dyspnea.  EXAM: CT CHEST WITH CONTRAST  TECHNIQUE: Multidetector CT imaging of the chest was performed during intravenous contrast administration.  CONTRAST:  17mL OMNIPAQUE IOHEXOL 300 MG/ML  SOLN  COMPARISON:  PET-CT on 09/12/2009  FINDINGS: A new large right pleural effusion is seen with diffuse right lung volume loss and consolidation. Persistent air bronchograms are seen throughout the right lung, with no evidence of central endobronchial obstruction. A new moderate left pleural effusion is also seen with mild compressive atelectasis in the posterior left lower lobe. Mild patchy areas of infiltrate or seen in the peripheral lingula and left lower lobe, but no discrete pulmonary nodules or masses are identified. There is no evidence of hilar lymphadenopathy. Mild mediastinal lymphadenopathy is noted in the right paratracheal region with largest lymph node measuring 11 mm on image 21. No other sites of mediastinal lymphadenopathy identified.  Surgical clips noted in the right axilla. No evidence chest wall mass or suspicious bone lesions. Both adrenal glands are normal in appearance.  IMPRESSION: New large right and small left  pleural effusions with compressive atelectasis. Diffuse right lung atelectasis or consolidation noted, without evidence of centrally obstructing mass.  Mild patchy areas of infiltrate in peripheral left upper and lower lobes. Pneumonia or other inflammatory process cannot be excluded.  Mild right paratracheal mediastinal lymphadenopathy, which is nonspecific. This may be reactive in etiology although metastatic disease cannot be excluded.   Electronically Signed   By: Earle Gell M.D.   On: 07/25/2013 18:04   Ct Abdomen Pelvis W Contrast  07/21/2013   CLINICAL DATA:  History of abdominal abscess.  EXAM: CT ABDOMEN AND PELVIS WITH CONTRAST  TECHNIQUE: Multidetector CT imaging of the abdomen and pelvis was performed using the standard protocol following bolus administration of intravenous contrast.  CONTRAST:  77mL OMNIPAQUE IOHEXOL 300 MG/ML  SOLN  COMPARISON:  07/15/2013  FINDINGS: Bilateral pleural effusions are again identified left greater than right. There is overlying compressive type consolidation and atelectasis.  No focal liver abnormality identified. The gallbladder appears normal. No biliary dilatation. Normal appearance of the pancreas. The left upper quadrant subphrenic abscess which has mass effect upon the spleen is again noted. This measures 6.7 x 2.5 cm, image 64/ coronal series. On the previous examination this measured 7.3 x 2.1 cm. Calcified granulomas within the splenic parenchyma are again noted.  Normal appearance of the adrenal glands. The kidneys are both on unremarkable. Urinary bladder appears normal. Partially calcified fibroid uterus is again noted.  There is calcified atherosclerotic disease involving the abdominal aorta. No aneurysm identified. Multiple reactive lymph nodes are noted in the upper abdomen. No significant adenopathy. No pelvic or inguinal  adenopathy noted. There is a left upper quadrant pigtail drainage catheter identified. The associated a bilobed fluid collection is  again noted. This measures 5.9 x 2.7 cm, image 41/series 2. Previously this measured 7.2 x 3.5 cm. Adjacent fluid collection posterior to the stomach measures 3 x 1.2 cm, image 40/series 2. Previously this measured 3 x 1.4 cm. Foci of extraluminal gas within the left abdomen appear associated with small bowel loops may indicate presence of fistula or sinus tract with the adjacent colon, image 50/series 2.  Gastrostomy tube is identified is identified within the stomach. The small bowel loops have a normal caliber. There is no evidence for bowel obstruction. Enteric contrast material reaches the colon and rectum.  Review of the visualized osseous structures is significant for mild multilevel spondylosis. 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  IMPRESSION: 1. Mild decrease in size of small bowel mesenteric abscess the pigtail drainage catheter remains in place. 2. No significant change in subphrenic abscess which has mass effect upon the spleen. 3. Again noted are foci of extraluminal gas within the left abdomen which may indicate the presence of a sinus tract or fistula between small enlarged bowel loops. No extravasation of contrast material is identified. 4. Bilateral pleural effusions.   Electronically Signed   By: Kerby Moors M.D.   On: 07/21/2013 10:07   Ct Abdomen Pelvis W Contrast  07/15/2013   CLINICAL DATA:  68 year old female status post jejunal ulcer repair and drain removal. Increasing leukocytosis. Initial encounter.  EXAM: CT ABDOMEN AND PELVIS WITH CONTRAST  TECHNIQUE: Multidetector CT imaging of the abdomen and pelvis was performed using the standard protocol following bolus administration of intravenous contrast.  CONTRAST:  167mL OMNIPAQUE IOHEXOL 300 MG/ML  SOLN  COMPARISON:  Preoperative CT Abdomen and Pelvis 07/05/2013.  FINDINGS: New moderate volume layering left pleural effusion. Combined compressive atelectasis and more confluent left lower lobe pulmonary opacity  adjacent to the effusion. No pericardial effusion. Trace layering right pleural effusion.  Ventral abdominal wound healing by secondary intention. New percutaneous gastrostomy tube in place. Small volume pneumoperitoneum.  Subdiaphragmatic perisplenic fluid (series 2, image 14) appears to be organizing and rim enhancing on the coronal images (series 5, image 103). This is about 7 cm diameter and 2 cm in thickness. Multilobulated rim enhancing abscess partially within the lesser sac located near the area of bowel perforation on the preoperative study (series 2, image 33). The most confluent portion of this abscess encompasses 7.2 x 3.8 cm.  Oral contrast within the bowel which has reached the rectum. No bowel obstruction. Small volume of mesenteric gas in the left abdomen (series 2, image 46). The cecum is distended. Redundant right colon. No focally inflamed bowel is identified.  Negative liver and gallbladder. Stable calcified granulomas in the spleen. Pancreatic parenchyma within normal limits. Adrenal glands within normal limits. Portal venous system is patent. Aortoiliac calcified atherosclerosis noted. Major arterial structures in the abdomen and pelvis are patent. Mildly distended bladder. Calcified fibroid uterus.  No acute osseous abnormality identified.  IMPRESSION: 1. Left upper quadrant subphrenic and small bowel mesenteric abscesses, including tracking into the lesser sac. 2. Moderate layering left pleural effusion with left lower lobe atelectasis versus developing pneumonia. 3. No bowel obstruction status post proximal jejunal repair. No extravasated oral contrast. Trace residual pneumoperitoneum.   Electronically Signed   By: Lars Pinks M.D.   On: 07/15/2013 12:47   Dg Chest Port 1 View  07/25/2013   CLINICAL DATA:  Labored breathing.  EXAM: PORTABLE CHEST - 1 VIEW  COMPARISON:  Chest radiograph performed 07/24/2013  FINDINGS: Small to moderate right and small left pleural effusions are seen, with  bibasilar airspace opacification, worse on the right. This likely reflects pulmonary edema, though superimposed pneumonia cannot be excluded. No pneumothorax is seen. Apparent bilateral nipple shadows are noted.  The cardiomediastinal silhouette is borderline normal in size. No acute osseous abnormalities are identified. Scattered clips are seen at the right axilla.  IMPRESSION: Small to moderate right and small left pleural effusions, with bibasilar airspace opacification, worse on the right. This likely reflects pulmonary edema, though superimposed pneumonia cannot be excluded. Findings are relatively stable from the prior study.   Electronically Signed   By: Garald Balding M.D.   On: 07/25/2013 02:55   Dg Chest Port 1 View  07/23/2013   CLINICAL DATA:  Leukocytosis.  EXAM: PORTABLE CHEST - 1 VIEW  COMPARISON:  07/15/2013 and 05/16/2011  FINDINGS: Examination demonstrates worsening bibasilar opacification likely combination of airspace disease with small effusions left greater than right. Dense 6 mm nodule over the left midlung unchanged from 2012. There is mild cardiomegaly. Remainder the exam is unchanged.  IMPRESSION: Worsening bibasilar opacification likely infection. Worsening small bilateral effusions left worse than right.  Mild cardiomegaly.   Electronically Signed   By: Marin Olp M.D.   On: 07/23/2013 08:05   Dg Chest Port 1 View  07/15/2013   CLINICAL DATA:  Leukocytosis, cough, history throat and breast cancer  EXAM: PORTABLE CHEST - 1 VIEW  COMPARISON:  Portable exam 0807 hr compared to 07/11/2013  FINDINGS: Normal heart size, mediastinal contours and pulmonary vascularity.  Left lower lobe infiltrate question pneumonia.  Small left pleural effusion.  Remaining lungs clear.  No pneumothorax.  Right axillary surgical clips.  Bones demineralized.  IMPRESSION: Left lower lobe pneumonia with small left pleural effusion.   Electronically Signed   By: Lavonia Dana M.D.   On: 07/15/2013 08:19   Dg  Chest Port 1 View  07/11/2013   CLINICAL DATA:  Interval extubation, pleural effusions, atelectasis  EXAM: PORTABLE CHEST - 1 VIEW  COMPARISON:  07/09/2013  FINDINGS: Patient has been extubated. Pleural effusions are smaller with resolving basilar atelectasis as well. Left midlung calcified granuloma again evident. Normal heart size and vascularity. Chronic apical scarring. Negative for pneumothorax. Postop changes in the right axilla. Right IJ central line tip at [facility] as before.  IMPRESSION: Extubated.  Smaller pleural effusions with improving basilar atelectasis   Electronically Signed   By: Daryll Brod M.D.   On: 07/11/2013 08:26   Dg Chest Port 1 View  07/09/2013   CLINICAL DATA:  On ventilator, evaluate endotracheal tube position, history breast cancer, throat cancer  EXAM: PORTABLE CHEST - 1 VIEW  COMPARISON:  Portable exam 0622 hr compared to 07/06/2013  FINDINGS: Tip of endotracheal tube projects 4.6 cm above carina.  Right jugular central venous catheter tip projects over SVC.  Normal heart size and mediastinal contours.  Bilateral pleural effusions and atelectasis.  Cannot exclude basilar infiltrates.  Calcified granuloma in the left mid lung is unchanged.  No pneumothorax.  Bones demineralized.  Surgical clips right axilla.  IMPRESSION: Persistent bibasilar effusions atelectasis, cannot exclude underlying basilar infiltrates.   Electronically Signed   By: Lavonia Dana M.D.   On: 07/09/2013 08:11   Dg Chest Port 1 View  07/06/2013   CLINICAL DATA:  Jejunal ulceration.  EXAM: PORTABLE CHEST - 1 VIEW  COMPARISON:  07/05/2013 and 05/16/2011.  FINDINGS: Endotracheal tube tip 2.5 cm above the carina.  Right central line tip mid superior vena cava level. No gross pneumothorax.  Stable appearance of biapical pleural thickening greater on the right without associated bony destruction.  Interval development of bilateral pleural effusions with basilar atelectasis. Basilar infiltrate could not be excluded in the  proper clinical setting.  Pulmonary vascular congestion most notable centrally.  Granuloma left mid lung.  Post right axillary lymph node dissection.  No gross pneumothorax.  Heart size top-normal.  IMPRESSION: Interval development of bilateral pleural effusions with basilar atelectasis. Basilar infiltrate could not be excluded in the proper clinical setting.  Pulmonary vascular congestion most notable centrally.   Electronically Signed   By: Chauncey Cruel M.D.   On: 07/06/2013 07:16   Dg Chest Port 1 View  07/05/2013   ADDENDUM REPORT: 07/05/2013 12:12  ADDENDUM: There is a 1.1 cm nodular density overlying the left lower chest. There is also a calcified granuloma in the left mid chest. The nodular structure could represent a pulmonary nodule or a nipple shadow. This area could be further evaluated with a followup study with nipple markers.  These results were called by telephone at the time of interpretation on 07/05/2013 at 12:11 PM to the patient's nurse, Anderson Malta, who verbally acknowledged these results.   Electronically Signed   By: Markus Daft M.D.   On: 07/05/2013 12:12   07/05/2013   CLINICAL DATA:  Check endotracheal tube and central line.  EXAM: PORTABLE CHEST - 1 VIEW  COMPARISON:  07/04/2013  FINDINGS: Endotracheal tube has been placed and positioned 3.2 cm above the carina. Right jugular central line tip in the lower SVC region. No evidence for a pneumothorax. Increased patchy densities in the lower lungs may represent dependent edema and/or atelectasis. Heart size is normal. Surgical clips in the right axilla.  IMPRESSION: Endotracheal tube and central line are well positioned. Negative for a pneumothorax.  Increased densities in the lower lungs may represent atelectasis or mild edema.  Electronically Signed: By: Markus Daft M.D. On: 07/05/2013 11:52   Dg Abd Acute W/chest  07/04/2013   CLINICAL DATA:  Abdominal pain and vomiting. Throat carcinoma and breast carcinoma.  EXAM: ACUTE ABDOMEN SERIES  (ABDOMEN 2 VIEW & CHEST 1 VIEW)  COMPARISON:  Chest radiograph on 05/16/2011  FINDINGS: There is no evidence of dilated bowel loops or free intraperitoneal air. No radiopaque calculi or other significant radiographic abnormality is seen. Severe bilateral hip osteoarthritis is noted, with chronic avascular necrosis and collapse of the right femoral head.  Heart size and mediastinal contours are within normal limits. Both lungs are clear. Calcified granuloma again noted in the left midlung. No evidence of pleural effusion. Surgical clips again seen in the right axilla.  IMPRESSION: No acute findings.   Electronically Signed   By: Earle Gell M.D.   On: 07/04/2013 23:34   Dg Abd Portable 1v  07/15/2013   CLINICAL DATA:  Leukocytosis after JP drain removal and change in antibiotics, history breast and throat cancer  EXAM: PORTABLE ABDOMEN - 1 VIEW  COMPARISON:  Portable exam 0810 hr compared to 07/04/2013  FINDINGS: Gastrostomy tube left upper quadrant.  Retained contrast in colon.  No bowel wall thickening or evidence of obstruction.  Degenerative changes of bilateral hip joints, severe on right with femoral head remodeling and superolateral subluxation.  IMPRESSION: Nonspecific bowel gas pattern.   Electronically Signed   By: Lavonia Dana M.D.   On: 07/15/2013 08:21   Dg  Ugi W/water Sol Cm  07/10/2013   CLINICAL DATA:  Status post repair of proximal jejunal perforation 07/05/2013. Evaluate for leak.  EXAM: WATER SOLUBLE UPPER GI SERIES  TECHNIQUE: Single-column upper GI series was performed using water soluble contrast.  CONTRAST:  150 ml Omnipaque 300 per gastrostomy tube.  COMPARISON:  Abdominal pelvic CT 07/05/2013.  FLUOROSCOPY TIME:  38 seconds of pulsed fluoro. Images were acquired with fluoro store mechanism to minimize radiation.  FINDINGS: The scout abdominal radiograph demonstrates no free intraperitoneal air. Percutaneous G-tube and mid abdominal surgical drain are noted. Scattered vascular  calcifications, calcified uterine fibroids and degenerative changes of both hips, worse on the right are noted.  Contrast was instilled via the G-tube by hand injection under intermittent fluoroscopy. There is normal filling of the stomach and emptying into the small bowel. The duodenum and jejunum appear normal without significant wall thickening. There is no evidence of perforation. There is no contrast opacification of the surgical drain. No significant gastroesophageal reflux was observed.  IMPRESSION: No significant findings following repair of jejunal perforation. No evidence of bowel leak.   Electronically Signed   By: Camie Patience M.D.   On: 07/10/2013 13:13   Ct Image Guided Fluid Drain By Catheter  07/17/2013   CLINICAL DATA:  Left abdominal abscess  EXAM: CT IMAGE GUIDED FLUID DRAIN BY CATHETER  MEDICATIONS AND MEDICAL HISTORY: Versed 2.5 mg, Fentanyl 75 mcg.  Additional Medications: None.  ANESTHESIA/SEDATION: Moderate sedation time: 12 minutes  CONTRAST:  None  FLUOROSCOPY TIME:  None minutes  PROCEDURE: The procedure, risks, benefits, and alternatives were explained to the patient. Questions regarding the procedure were encouraged and answered. The patient understands and consents to the procedure.  The left upper quad was prepped with Betadine in a sterile fashion, and a sterile drape was applied covering the operative field. A sterile gown and sterile gloves were used for the procedure.  1% lidocaine was utilized for local anesthesia.  Under CT guidance, an 18 gauge needle was inserted into the abdominal abscess. It was removed over an Amplatz. A 12 French dilator followed by a 12 [place] drain was inserted into the fluid collection. It was looped and string fixed then sewn the skin.  COMPLICATIONS: None  FINDINGS: Imaging documents 31 French drain placement into a left upper quadrant abscess.  IMPRESSION: Successful left upper quadrant abscess strain yielding pus.   Electronically Signed   By: Maryclare Bean M.D.   On: 07/17/2013 09:14   US Thoracentesis Asp Pleural Space W/img Guide  07/24/2013   CLINICAL DATA:  Shortness of breath, pleural effusion.  EXAM: ULTRASOUND GUIDED left THORACENTESIS  COMPARISON:  None.  FINDINGS: A total of approximately 500 ml of cloudy serous fluid was removed. A fluid sample was sent for laboratory analysis.  IMPRESSION: Successful ultrasound guided left thoracentesis yielding 500 ml of pleural fluid.  Read By:  Tsosie Billing PA-C  PROCEDURE: An ultrasound guided thoracentesis was thoroughly discussed with the patient and questions answered. The benefits, risks, alternatives and complications were also discussed. The patient understands and wishes to proceed with the procedure. Written consent was obtained.  Ultrasound was performed to localize and mark an adequate pocket of fluid in the left chest. The area was then prepped and draped in the normal sterile fashion. 1% Lidocaine was used for local anesthesia. Under ultrasound guidance a 19 gauge Yueh catheter was introduced. Thoracentesis was performed. The catheter was removed and a dressing applied.  Complications:  none.   Electronically  Signed   By: Jacqulynn Cadet M.D.   On: 07/24/2013 16:57    Microbiology: Recent Results (from the past 240 hour(s))  CULTURE, EXPECTORATED SPUTUM-ASSESSMENT     Status: None   Collection Time    07/23/13 10:22 AM      Result Value Range Status   Specimen Description SPUTUM   Final   Special Requests Normal   Final   Sputum evaluation     Final   Value: MICROSCOPIC FINDINGS SUGGEST THAT THIS SPECIMEN IS NOT REPRESENTATIVE OF LOWER RESPIRATORY SECRETIONS. PLEASE RECOLLECT.     CALLED TO V CAIN,RN 07/23/13 1150 BY K SCHULTZ   Report Status 07/23/2013 FINAL   Final  BODY FLUID CULTURE     Status: None   Collection Time    07/24/13  3:57 PM      Result Value Range Status   Specimen Description PLEURAL FLUID LEFT   Final   Special Requests Normal   Final   Gram Stain     Final    Value: NO SQUAMOUS EPITHELIAL CELLS SEEN     MODERATE WBC PRESENT, PREDOMINANTLY PMN     NO ORGANISMS SEEN     Performed at [facility]   Culture     Final   Value: NO GROWTH 3 DAYS     Performed at [facility]   Report Status PENDING   Incomplete  CULTURE, BLOOD (ROUTINE X 2)     Status: None   Collection Time    07/24/13  7:25 PM      Result Value Range Status   Specimen Description BLOOD LEFT HAND   Final   Special Requests BOTTLES DRAWN AEROBIC AND ANAEROBIC 10CC   Final   Culture  Setup Time     Final   Value: 07/25/2013 01:09     Performed at [facility]   Culture     Final   Value:        BLOOD CULTURE RECEIVED NO GROWTH TO DATE CULTURE WILL BE HELD FOR 5 DAYS BEFORE ISSUING A FINAL NEGATIVE REPORT     Performed at [facility]   Report Status PENDING   Incomplete  CULTURE, BLOOD (ROUTINE X 2)     Status: None   Collection Time    07/24/13  7:35 PM      Result Value Range Status   Specimen Description BLOOD LEFT ARM   Final   Special Requests BOTTLES DRAWN AEROBIC AND ANAEROBIC 10CC   Final   Culture  Setup Time     Final   Value: 07/25/2013 01:10     Performed at [facility]   Culture     Final   Value:        BLOOD CULTURE RECEIVED NO GROWTH TO DATE CULTURE WILL BE HELD FOR 5 DAYS BEFORE ISSUING A FINAL NEGATIVE REPORT     Performed at [facility]   Report Status PENDING   Incomplete  CLOSTRIDIUM DIFFICILE BY PCR     Status: None   Collection Time    07/25/13  3:41 PM      Result Value Range Status   C difficile by pcr NEGATIVE  NEGATIVE Final     Labs: Basic Metabolic Panel:  Recent Labs Lab 07/21/13 0418 07/24/13 0306 07/26/13 0759  NA 133* 133* 130*  K 3.9 4.2 4.4  CL 100 93* 90*  CO2 21 26 25   GLUCOSE 99 98 121*  BUN 4* 9 11  CREATININE 0.48* 0.62 0.55  CALCIUM 6.7* 7.0* 7.4*  PHOS  --   --  3.2   Liver Function Tests:  Recent Labs Lab 07/24/13 1925 07/26/13 0759  AST 15  --   ALT 7   --   ALKPHOS 141*  --   BILITOT <0.2*  --   PROT 5.8*  --   ALBUMIN 1.5* 1.3*   No results found for this basename: LIPASE, AMYLASE,  in the last 168 hours No results found for this basename: AMMONIA,  in the last 168 hours CBC:  Recent Labs Lab 07/21/13 0418 07/23/13 0328 07/24/13 0306 07/25/13 0518 07/26/13 0759  WBC 16.4* 18.6* 20.4* 32.1* 24.1*  NEUTROABS  --   --   --   --  21.7*  HGB 8.4* 7.6* 7.8* 7.6* 7.0*  HCT 24.4* 22.7* 23.1* 22.3* 20.0*  MCV 81.3 83.5 81.3 80.5 81.3  PLT 1079* 1256* 1263* 1247* 1130*   Cardiac Enzymes: No results found for this basename: CKTOTAL, CKMB, CKMBINDEX, TROPONINI,  in the last 168 hours BNP: BNP (last 3 results)  Recent Labs  07/09/13 0430  PROBNP 17532.0*   CBG:  Recent Labs Lab 07/25/13 1750 07/25/13 2035 07/26/13 0833 07/26/13 1156 07/26/13 2018  GLUCAP 112* 134* 105* 116* 107*    Principal Problem:   Perforated ulcer Active Problems:   TOBACCO USE   Difficult airway for intubation   AKI (acute kidney injury)   Protein-calorie malnutrition, moderate   Acute blood loss anemia   S/P exploratory laparotomy   Secondary cardiomyopathy, unspecified   Pleural effusion   Thrombocytosis   Time coordinating discharge: >30 mins  Signed:  Coralie Keens, [facility] [phone]

## 2013-07-28 LAB — BODY FLUID CULTURE
Culture: NO GROWTH
GRAM STAIN: NONE SEEN
SPECIAL REQUESTS: NORMAL

## 2013-07-29 LAB — STOOL CULTURE

## 2013-07-30 LAB — CRYOGLOBULIN

## 2013-07-31 LAB — CULTURE, BLOOD (ROUTINE X 2): CULTURE: NO GROWTH

## 2013-08-20 ENCOUNTER — Emergency Department (HOSPITAL_COMMUNITY)
Admission: EM | Admit: 2013-08-20 | Discharge: 2013-08-20 | Disposition: A | Discharge status: Home / Self Care | Attending: Emergency Medicine | Admitting: Emergency Medicine

## 2013-08-20 ENCOUNTER — Encounter (HOSPITAL_COMMUNITY): Payer: Self-pay | Admitting: Emergency Medicine

## 2013-08-20 ENCOUNTER — Emergency Department (HOSPITAL_COMMUNITY)

## 2013-08-20 DIAGNOSIS — J441 Chronic obstructive pulmonary disease with (acute) exacerbation: Secondary | ICD-10-CM | POA: Insufficient documentation

## 2013-08-20 DIAGNOSIS — E039 Hypothyroidism, unspecified: Secondary | ICD-10-CM | POA: Insufficient documentation

## 2013-08-20 DIAGNOSIS — Z85819 Personal history of malignant neoplasm of unspecified site of lip, oral cavity, and pharynx: Secondary | ICD-10-CM | POA: Insufficient documentation

## 2013-08-20 DIAGNOSIS — R6510 Systemic inflammatory response syndrome (SIRS) of non-infectious origin without acute organ dysfunction: Secondary | ICD-10-CM

## 2013-08-20 DIAGNOSIS — Z9889 Other specified postprocedural states: Secondary | ICD-10-CM

## 2013-08-20 DIAGNOSIS — D72829 Elevated white blood cell count, unspecified: Secondary | ICD-10-CM | POA: Diagnosis present

## 2013-08-20 DIAGNOSIS — E871 Hypo-osmolality and hyponatremia: Secondary | ICD-10-CM | POA: Insufficient documentation

## 2013-08-20 DIAGNOSIS — Z7982 Long term (current) use of aspirin: Secondary | ICD-10-CM | POA: Insufficient documentation

## 2013-08-20 DIAGNOSIS — J690 Pneumonitis due to inhalation of food and vomit: Secondary | ICD-10-CM | POA: Insufficient documentation

## 2013-08-20 DIAGNOSIS — R000 Tachycardia, unspecified: Secondary | ICD-10-CM | POA: Insufficient documentation

## 2013-08-20 DIAGNOSIS — E440 Moderate protein-calorie malnutrition: Secondary | ICD-10-CM | POA: Diagnosis present

## 2013-08-20 DIAGNOSIS — Z79899 Other long term (current) drug therapy: Secondary | ICD-10-CM | POA: Insufficient documentation

## 2013-08-20 DIAGNOSIS — R1310 Dysphagia, unspecified: Secondary | ICD-10-CM

## 2013-08-20 DIAGNOSIS — A419 Sepsis, unspecified organism: Secondary | ICD-10-CM | POA: Insufficient documentation

## 2013-08-20 DIAGNOSIS — D649 Anemia, unspecified: Secondary | ICD-10-CM | POA: Insufficient documentation

## 2013-08-20 DIAGNOSIS — Z853 Personal history of malignant neoplasm of breast: Secondary | ICD-10-CM | POA: Insufficient documentation

## 2013-08-20 DIAGNOSIS — F17200 Nicotine dependence, unspecified, uncomplicated: Secondary | ICD-10-CM | POA: Insufficient documentation

## 2013-08-20 DIAGNOSIS — I10 Essential (primary) hypertension: Secondary | ICD-10-CM | POA: Insufficient documentation

## 2013-08-20 LAB — URINALYSIS, ROUTINE W REFLEX MICROSCOPIC
BILIRUBIN URINE: NEGATIVE
GLUCOSE, UA: 100 mg/dL — AB
HGB URINE DIPSTICK: NEGATIVE
Ketones, ur: NEGATIVE mg/dL
Nitrite: NEGATIVE
Protein, ur: NEGATIVE mg/dL
Specific Gravity, Urine: 1.017 (ref 1.005–1.030)
Urobilinogen, UA: 0.2 mg/dL (ref 0.0–1.0)
pH: 5.5 (ref 5.0–8.0)

## 2013-08-20 LAB — CBC WITH DIFFERENTIAL/PLATELET
BASOS PCT: 0 % (ref 0–1)
Basophils Absolute: 0 10*3/uL (ref 0.0–0.1)
EOS PCT: 1 % (ref 0–5)
Eosinophils Absolute: 0.3 10*3/uL (ref 0.0–0.7)
HCT: 19.4 % — ABNORMAL LOW (ref 36.0–46.0)
Hemoglobin: 6.2 g/dL — CL (ref 12.0–15.0)
LYMPHS PCT: 3 % — AB (ref 12–46)
Lymphs Abs: 0.8 10*3/uL (ref 0.7–4.0)
MCH: 25.8 pg — AB (ref 26.0–34.0)
MCHC: 32 g/dL (ref 30.0–36.0)
MCV: 80.8 fL (ref 78.0–100.0)
MONO ABS: 0.8 10*3/uL (ref 0.1–1.0)
Monocytes Relative: 3 % (ref 3–12)
NEUTROS PCT: 93 % — AB (ref 43–77)
Neutro Abs: 26.3 10*3/uL — ABNORMAL HIGH (ref 1.7–7.7)
PLATELETS: 1048 10*3/uL — AB (ref 150–400)
RBC: 2.4 MIL/uL — AB (ref 3.87–5.11)
RDW: 17.6 % — ABNORMAL HIGH (ref 11.5–15.5)
WBC MORPHOLOGY: INCREASED
WBC: 28.2 10*3/uL — ABNORMAL HIGH (ref 4.0–10.5)

## 2013-08-20 LAB — COMPREHENSIVE METABOLIC PANEL
ALBUMIN: 2.2 g/dL — AB (ref 3.5–5.2)
ALT: 19 U/L (ref 0–35)
AST: 25 U/L (ref 0–37)
Alkaline Phosphatase: 248 U/L — ABNORMAL HIGH (ref 39–117)
BUN: 17 mg/dL (ref 6–23)
CHLORIDE: 87 meq/L — AB (ref 96–112)
CO2: 23 meq/L (ref 19–32)
CREATININE: 0.52 mg/dL (ref 0.50–1.10)
Calcium: 9.3 mg/dL (ref 8.4–10.5)
GFR calc Af Amer: >90 mL/min (ref >90)
GFR calc non Af Amer: >90 mL/min (ref >90)
Glucose, Bld: 127 mg/dL — ABNORMAL HIGH (ref 70–99)
Potassium: 5.3 mEq/L (ref 3.7–5.3)
SODIUM: 124 meq/L — AB (ref 137–147)
Total Protein: 7.3 g/dL (ref 6.0–8.3)

## 2013-08-20 LAB — URINE MICROSCOPIC-ADD ON

## 2013-08-20 LAB — POCT I-STAT TROPONIN I: Troponin i, poc: 0.01 ng/mL (ref 0.00–0.08)

## 2013-08-20 LAB — LACTIC ACID, PLASMA: Lactic Acid, Venous: 1.8 mmol/L (ref 0.5–2.2)

## 2013-08-20 LAB — PATHOLOGIST SMEAR REVIEW

## 2013-08-20 LAB — PREPARE RBC (CROSSMATCH)

## 2013-08-20 MED ORDER — PIPERACILLIN-TAZOBACTAM 3.375 G IVPB 30 MIN
3.3750 g | Freq: Once | INTRAVENOUS | Status: AC
  Administered 2013-08-20: 3.375 g via INTRAVENOUS
  Filled 2013-08-20: qty 50

## 2013-08-20 MED ORDER — LORAZEPAM 2 MG/ML IJ SOLN
1.0000 mg | Freq: Once | INTRAMUSCULAR | Status: AC
  Administered 2013-08-20: 1 mg via INTRAVENOUS
  Filled 2013-08-20: qty 1

## 2013-08-20 MED ORDER — SODIUM CHLORIDE 0.9 % IV BOLUS (SEPSIS)
2000.0000 mL | Freq: Once | INTRAVENOUS | Status: AC
  Administered 2013-08-20: 2000 mL via INTRAVENOUS

## 2013-08-20 MED ORDER — MORPHINE SULFATE 4 MG/ML IJ SOLN
4.0000 mg | Freq: Once | INTRAMUSCULAR | Status: AC
  Administered 2013-08-20: 4 mg via INTRAVENOUS
  Filled 2013-08-20: qty 1

## 2013-08-20 MED ORDER — AMOXICILLIN-POT CLAVULANATE 875-125 MG PO TABS: 1.0000 | ORAL_TABLET | Freq: Two times a day (BID) | ORAL | Status: DC

## 2013-08-20 MED ORDER — ALBUTEROL (5 MG/ML) CONTINUOUS INHALATION SOLN
10.0000 mg/h | INHALATION_SOLUTION | Freq: Once | RESPIRATORY_TRACT | Status: AC
  Administered 2013-08-20: 10 mg/h via RESPIRATORY_TRACT
  Filled 2013-08-20: qty 20

## 2013-08-20 MED ORDER — VANCOMYCIN HCL IN DEXTROSE 1-5 GM/200ML-% IV SOLN
1000.0000 mg | Freq: Once | INTRAVENOUS | Status: AC
  Administered 2013-08-20: 1000 mg via INTRAVENOUS
  Filled 2013-08-20: qty 200

## 2013-08-20 MED ORDER — METHYLPREDNISOLONE SODIUM SUCC 125 MG IJ SOLR
125.0000 mg | Freq: Once | INTRAMUSCULAR | Status: AC
  Administered 2013-08-20: 125 mg via INTRAVENOUS
  Filled 2013-08-20: qty 2

## 2013-08-20 MED ORDER — IPRATROPIUM BROMIDE 0.02 % IN SOLN
0.5000 mg | Freq: Once | RESPIRATORY_TRACT | Status: AC
  Administered 2013-08-20: 0.5 mg via RESPIRATORY_TRACT
  Filled 2013-08-20: qty 2.5

## 2013-08-20 NOTE — ED Provider Notes (Signed)
9:54 AM Patient receiving transfusion. I discussed her case w surgery and hospitalist. D/C pending, following completion of transfusion.    Carmin Muskrat, MD 08/20/13 1118

## 2013-08-20 NOTE — ED Notes (Signed)
MOST form states no IV's, but POA says it is ok. POA is also allows antibiotics.

## 2013-08-20 NOTE — ED Notes (Signed)
Report per EMS. Pt is a hospice pt with cancer. Pt's POA called EMS reporting that the pt woke up with sounding like "she aspirated something". Pt has moist, productive cough. Pt was incontinent of feces en route. Pt has DNR and MOST form at bedside. EMS reports rhonchi throughout. POA reports that the pt is lethargic, which is not normal for her. Pt is normally able to speak in full sentences. Pt was in hospital last week due to pneumonia and pleural effusion. Pt was d/c'd with good health. Pt has increased SOB. POA approved IV for fluids. POA states that the pt's problems started when she was taken to the hospital 6 weeks ago for emergency abdominal surgery. It was then discovered that the pt had two abscesses in her bowel. Pt has a PEG tube, an 1 drain. EMS states that the pt feels warm to the touch, however her temperature was 98.1.

## 2013-08-20 NOTE — ED Notes (Signed)
Patient is resting comfortably with family at bedside. 

## 2013-08-20 NOTE — ED Notes (Signed)
Patient is resting comfortably, with family at bedside.

## 2013-08-20 NOTE — ED Provider Notes (Signed)
CSN: [id number]     Arrival date & time 08/20/13  0319 History   First MD Initiated Contact with Patient 08/20/13 0330     Chief Complaint  Patient presents with  . Shortness of Breath  . Tachycardia     (Consider location/radiation/quality/duration/timing/severity/associated sxs/prior Treatment) HPI This patient is an unfortunate 68 yo woman who was discharged from this hospital in late January after a lengthy stay for perforated ulcer complicated by multiple intra abdominal abscesses. Additionally, the patient has a history of laryngeal CA. She had percutaneous drains placed and was discharged with home Hospice care.   Patient is BIB EMS from home with respiratory distress. The patient reportedly woke up from sleep with wet sounding productive cough, wheezing and SOB, per her domestic partner and healthcare POA. . The patient had sats of 84% on RA when she was first brought to the ED. She is unaware of any recent fever. Denies chest pain.    Past Medical History  Diagnosis Date  . Hyperlipidemia     STATES SHE NO LONGER HAS HI CHOLESTEROL  . Rhinitis   . Fatigue 12/10/2011  . Hypothyroid 12/11/2011  . Hypertension     hx of, no current med for last 3 years  . Throat cancer 2010  . Breast CA 2008    right breast  . Headache(784.0)     tension  . Nausea alone 04/01/2013  . Difficult airway    Past Surgical History  Procedure Laterality Date  . Panendoscopy  05/16/2011    Procedure: PANENDOSCOPY;  Surgeon: Tyson Alias;  Location: [place];  Service: ENT;  Laterality: N/A;  Direct laryngoscopy, esophagoscopy  . Radical neck dissection  05/16/2011    Procedure: RADICAL NECK DISSECTION;  Surgeon: Tyson Alias;  Location: [place];  Service: ENT;  Laterality: Left;  Modified radical left neck dissection   . Appendectomy  yrs ago  . Cysts removed from back abd breast  yrs ago  . Breast lumpectomy Right 2008  . Esophagogastroduodenoscopy N/A 01/13/2013    Procedure:  ESOPHAGOGASTRODUODENOSCOPY (EGD);  Surgeon: Cleotis Nipper, MD;  Location: [facility];  Service: Endoscopy;  Laterality: N/A;  . Balloon dilation N/A 01/13/2013    Procedure: BALLOON DILATION;  Surgeon: Cleotis Nipper, MD;  Location: [facility];  Service: Endoscopy;  Laterality: N/A;  . Laparotomy N/A 07/05/2013    Procedure: EXPLORATORY LAPAROTOMY, insertion of gastrostomy tube, repair jejunal ulcer perforation.;  Surgeon: Rolm Bookbinder, MD;  Location: [facility] OR;  Service: General;  Laterality: N/A;   Family History  Problem Relation Age of Onset  . Hypertension    . Throat cancer    . Rheum arthritis    . Hypertension Mother   . Hyperlipidemia Mother   . Throat cancer Father    History  Substance Use Topics  . Smoking status: Current Every Day Smoker -- 2.00 packs/day for 40 years    Types: Cigarettes    Last Attempt to Quit: 04/21/2011  . Smokeless tobacco: Never Used  . Alcohol Use: 0.0 oz/week     Comment: 12 cans beer per day   OB History   Grav Para Term Preterm Abortions TAB SAB Ect Mult Living   0 0             Review of Systems Unable to obtain ROS from the patient due to respiratory distress    Allergies  Ace inhibitors; Hydrocodone; Hydromorphone; Tylox; and Sulfa antibiotics  Home Medications   Current Outpatient Rx  Name  Route  Sig  Dispense  Refill  . acetaminophen (TYLENOL) 160 MG/5ML elixir   Oral   Take 15 mg/kg by mouth every 4 (four) hours as needed for fever.         . Alum & Mag Hydroxide-Simeth (MAGIC MOUTHWASH W/LIDOCAINE) SOLN   Oral   Take 5 mLs by mouth 3 (three) times daily as needed for mouth pain.   120 mL   0   . amoxicillin-clavulanate (AUGMENTIN) 875-125 MG per tablet   Oral   Take 1 tablet by mouth 2 (two) times daily.   60 tablet   0   . aspirin 325 MG tablet   Oral   Take 1 tablet (325 mg total) by mouth daily.   30 tablet   1   . desoximetasone (TOPICORT) 0.25 % cream   Topical   Apply 1 application  topically 2 (two) times daily. Apply to affected area          . diclofenac (VOLTAREN) 75 MG EC tablet   Oral   Take 75 mg by mouth 2 (two) times daily.          Marland Kitchen diltiazem (CARDIZEM) 10 mg/ml oral suspension   Per Tube   Place 6 mLs (60 mg total) into feeding tube every 6 (six) hours.   1000 mL   1   . feeding supplement, ENSURE COMPLETE, (ENSURE COMPLETE) LIQD   Per Tube   Place 237 mLs into feeding tube 3 (three) times daily between meals.   90 Bottle   3   . feeding supplement, RESOURCE BREEZE, (RESOURCE BREEZE) LIQD   Oral   Take 1 Container by mouth 2 (two) times daily with a meal.   60 Container   3   . ferrous sulfate 300 (60 FE) MG/5ML syrup   Oral   Take 5 mLs (300 mg total) by mouth 3 (three) times daily with meals.   150 mL   0   . fluconazole (DIFLUCAN) 200 MG tablet   Oral   Take 1 tablet (200 mg total) by mouth daily.   5 tablet   0   . furosemide (LASIX) 40 MG tablet   Oral   Take 1 tablet (40 mg total) by mouth 2 (two) times daily.   60 tablet   0   . ipratropium-albuterol (DUONEB) 0.5-2.5 (3) MG/3ML SOLN   Nebulization   Take 3 mLs by nebulization every 4 (four) hours as needed.   360 mL   0   . levothyroxine (SYNTHROID, LEVOTHROID) 100 MCG tablet   Oral   Take 1 tablet (100 mcg total) by mouth daily before breakfast.   30 tablet   0   . LORazepam (ATIVAN) 0.5 MG tablet   Oral   Take 1 tablet (0.5 mg total) by mouth every 8 (eight) hours as needed for anxiety.   90 tablet   3   . losartan (COZAAR) 25 MG tablet   Oral   Take 1 tablet (25 mg total) by mouth daily.   30 tablet   0   . magnesium hydroxide (MILK OF MAGNESIA) 800 MG/5ML suspension   Oral   Take 15 mLs by mouth daily as needed for constipation.         . Multiple Vitamin (MULTIVITAMIN) LIQD   Oral   Take 5 mLs by mouth daily.   1 Bottle   3   . omeprazole (PRILOSEC) 20 MG capsule   Oral   Take 1  capsule (20 mg total) by mouth daily.   90 capsule   3    . ondansetron (ZOFRAN) 8 MG tablet   Oral   Take 1 tablet (8 mg total) by mouth every 8 (eight) hours as needed for nausea.   90 tablet   3   . oxyCODONE (ROXICODONE INTENSOL) 100 MG/5ML concentrated solution      Oxyfast 20mg /ml 5 mg SL q 2 hours as needed for pain or dyspnea   30 mL   0     DO NOT DISPENSE 100 mg/5 ml concentration!!! Use 2 ...   . oxymetazoline (AFRIN) 0.05 % nasal spray   Nasal   Place 1 spray into the nose 2 (two) times daily.         . potassium chloride 20 MEQ/15ML (10%) solution   Oral   Take 30 mLs (40 mEq total) by mouth daily.   500 mL   1   . simethicone (MYLICON) 914 MG chewable tablet   Oral   Chew 125 mg by mouth every 6 (six) hours as needed for flatulence.          BP 110/59  Temp(Src) 100.7 F (38.2 C) (Oral)  Resp 18  SpO2 84% Physical Exam Gen: appears stated age, cachectic, acutely and chronically ill appearing, respiratory distress present Head: NCAT Eyes: PERL, EOMI Nose: no epistaixis or rhinorrhea Mouth/throat: mucosa is moist and pink Neck: supple, no stridor Lungs:RR 32/min, diffuse wheezing, frequent productive cough, accessory muscle use CV: rapid and regular, no murmur, extremities appear well perfused.  Abd: PEG in place, percutaneous drain in place.  Back: kyphosis present Skin: cool and dry Ext: normal to inspection, no dependent edema Neuro: CN ii-xii grossly intact, no focal deficits Psyche; anxious affect,cooperative.   ED Course  Procedures (including critical care time) Labs Review  Results for orders placed during the hospital encounter of 08/20/13 (from the past 24 hour(s))  CBC WITH DIFFERENTIAL     Status: Abnormal   Collection Time    08/20/13  3:38 AM      Result Value Ref Range   WBC 28.2 (*) 4.0 - 10.5 K/uL   RBC 2.40 (*) 3.87 - 5.11 MIL/uL   Hemoglobin 6.2 (*) 12.0 - 15.0 g/dL   HCT 19.4 (*) 36.0 - 46.0 %   MCV 80.8  78.0 - 100.0 fL   MCH 25.8 (*) 26.0 - 34.0 pg   MCHC 32.0  30.0 -  36.0 g/dL   RDW 17.6 (*) 11.5 - 15.5 %   Platelets 1048 (*) 150 - 400 K/uL   Neutrophils Relative % 93 (*) 43 - 77 %   Lymphocytes Relative 3 (*) 12 - 46 %   Monocytes Relative 3  3 - 12 %   Eosinophils Relative 1  0 - 5 %   Basophils Relative 0  0 - 1 %   Neutro Abs 26.3 (*) 1.7 - 7.7 K/uL   Lymphs Abs 0.8  0.7 - 4.0 K/uL   Monocytes Absolute 0.8  0.1 - 1.0 K/uL   Eosinophils Absolute 0.3  0.0 - 0.7 K/uL   Basophils Absolute 0.0  0.0 - 0.1 K/uL   RBC Morphology TARGET CELLS     WBC Morphology INCREASED BANDS (>20% BANDS)    COMPREHENSIVE METABOLIC PANEL     Status: Abnormal   Collection Time    08/20/13  3:38 AM      Result Value Ref Range   Sodium 124 (*) 137 - 147 mEq/L  Potassium 5.3  3.7 - 5.3 mEq/L   Chloride 87 (*) 96 - 112 mEq/L   CO2 23  19 - 32 mEq/L   Glucose, Bld 127 (*) 70 - 99 mg/dL   BUN 17  6 - 23 mg/dL   Creatinine, Ser 0.52  0.50 - 1.10 mg/dL   Calcium 9.3  8.4 - 10.5 mg/dL   Total Protein 7.3  6.0 - 8.3 g/dL   Albumin 2.2 (*) 3.5 - 5.2 g/dL   AST 25  0 - 37 U/L   ALT 19  0 - 35 U/L   Alkaline Phosphatase 248 (*) 39 - 117 U/L   Total Bilirubin <0.2 (*) 0.3 - 1.2 mg/dL   GFR calc non Af Amer >90  >90 mL/min   GFR calc Af Amer >90  >90 mL/min  LACTIC ACID, PLASMA     Status: None   Collection Time    08/20/13  3:45 AM      Result Value Ref Range   Lactic Acid, Venous 1.8  0.5 - 2.2 mmol/L  POCT I-STAT TROPONIN I     Status: None   Collection Time    08/20/13  3:45 AM      Result Value Ref Range   Troponin i, poc 0.01  0.00 - 0.08 ng/mL   Comment 3           TYPE AND SCREEN     Status: None   Collection Time    08/20/13  6:10 AM      Result Value Ref Range   ABO/RH(D) B POS     Antibody Screen NEG     Sample Expiration 08/23/2013     Unit Number [id number]     Blood Component Type RED CELLS,LR     Unit division 00     Status of Unit ISSUED     Transfusion Status OK TO TRANSFUSE     Crossmatch Result Compatible    PREPARE RBC (CROSSMATCH)      Status: None   Collection Time    08/20/13  6:10 AM      Result Value Ref Range   Order Confirmation ORDER PROCESSED BY BLOOD BANK     EKG: sinus tach, no acute ischemic changes, normal intervals, normal axis, normal qrs complex  MDM   CRITICAL CARE Performed by: Elyn Peers   Total critical care time: 43m  Critical care time was exclusive of separately billable procedures and treating other patients.  Critical care was necessary to treat or prevent imminent or life-threatening deterioration.  Critical care was time spent personally by me on the following activities: development of treatment plan with patient and/or surrogate as well as nursing, discussions with consultants, evaluation of patient's response to treatment, examination of patient, obtaining history from patient or surrogate, ordering and performing treatments and interventions, ordering and review of laboratory studies, ordering and review of radiographic studies, pulse oximetry and re-evaluation of patient's condition.    DDX: HCAP, abdominal sepsis, anemia, urosepsis, copd excacerbation.   Patient responded with improvement in respiratory status following 90m continous Albuterol neb and solumedrol. CXR notable for bilateral infiltrates but, overall improvement from most recent CXR performed approx 1 month ago. Labs notable for WBC of 28K. Lactate is wnl. Hgb is low at 6.2. We will transfuse.   I had an extensive discussion with the patient's partner and HPOA. She initially said that she wanted the patient admitted for iv abx, ivf and blood transfusion. Also requests CT a/p to  see if percutaneous drains can be removed. This study has been ordered.   I had consulted IM to admit the patient. However, upon secondary discussion with HPOA, she says that she would like for the patient to receive PRBC and CT a/p to see if drains can be pulled and then she wants to take the patient home to continue Hospice/palliative care.    We will order CT A/P.  Will consult [facility] with results to discuss drain issue.  Dr. Vanita Panda will kindly follow up on CT results and talk to [place]. IM has consulted and recommends d/c home to palliative care with Augmentin for pneumonia.      Elyn Peers, MD 08/20/13 [phone]

## 2013-08-20 NOTE — ED Notes (Signed)
Patient is alert and orientedx4.  Patient was explained discharge instructions and they understood them with no questions.  The patient's significant other, Caren Renelda Mom is present and understands the discharge instructions.

## 2013-08-20 NOTE — ED Notes (Signed)
Dr. Maxwell Caul removed the JP drain with no issues.

## 2013-08-20 NOTE — Consult Note (Signed)
[facility]       PATIENT DETAILS Name: Taylor [name] Age: 68 y.o. Sex: female Date of Birth: 11-30-1945 Admit Date: 08/20/2013 [id number] KOTVAN, MD Requesting MD: Brandt Loosen, MD  Date of consultation: 08/20/13  REASON FOR CONSULTATION:  admission  Impression Principal Problem:   Aspiration pneumonia Active Problems:   BREAST CANCER, HX OF   Hx of laryngeal cancer   Dysphagia   Hyponatremia   Protein-calorie malnutrition, moderate   S/P exploratory laparotomy for jejunal perforation complicate by intra-abdominal abscess   Leukocytosis   Anemia  Recommendations  Discussion with patient's partner and of attorney was done at bedside-she does realize that the patient's overall prognosis is still very poor, she does want to continue with home hospice services. She does not want any aggressive care, patient is still a DO NOT RESUSCITATE. However, her main concern is to see if the abdominal drain can be removed as it would make her more comfortable. She does want a unit of PRBC transfused, and does want antibiotics, but does want to take her home today. She feels that the patient would be more comfortable at home. Given the above, the recommendations from internal medicine point of view as follows.  1. Presumed aspiration pneumonia - Patient has a long-standing history of chronic aspiration, her partner tells me that she knows that the patient aspirates because of a history of laryngeal cancer. Her clinical presentation today for shortness of breath, tachycardia is highly suggestive of aspiration pneumonitis. She has some wheezing, however has improved significantly then on initial presentation to the emergency room. For this, I would recommend Augmentin for a period of 5-7 days. Her partner is agreeable to this. Patient is wheezing, patient already has a nebulizer machine at home, would recommend that patient take nebulized bronchodilators every 4-6  hours as tolerated.  2. SIR's - Secondary to presumed aspiration pneumonia, ongoing intra-abdominal abscess. Very poor overall prognosis, partner aware of this. Her main concern as noted above, is to see if the abdominal drain can be removed. See above and below for further discussion.  2. Anemia - Patient has no overt evidence of blood loss, suspect this is anemia of chronic disease. Suspect that given ongoing inflammation/malignancy, that some amount of anemia he is to be expected. Patient's partner is agreeable to be transfused 1 unit of PRBC, I have explained to the patient's partner that her anemia is going to get worse given the amount of chronic illnesses an active ongoing inflammation that the patient has. Patient's partner is aware of this, she would like 1 unit of PRBC transfused while patient is here.  3. Recent history of jejunal perforation with intra-abdominal abscesses-status post abdominal drain in place - Patient partner's main concern is to see if the pain can be removed, she tells me that she flosses it daily and has approximately 5 cc of pus coming out on a daily basis. I feel a CT scan of the abdomen would be able to better the need if the collection is worse or any better. However after discussion with the surgical team, we will hold off on obtaining a CT scan to Gen. surgery evaluates this patient. I do feel that patient's ongoing leukocytosis, failure to thrive syndrome is probably related to this issue mostly.  4. Hyponatremia - Suspect secondary to to dehydration from, hydrate while she is here in the emergency room. Given overall poor prognosis, and the fact that patient is already on hospice services at home, and  would best benefit from transition to full comfort measures.  5. History of breast and laryngeal cancer Breast cancer: - Reviewed by oncology notes, patient has invasive right ductal cancer and is status post chemotherapy and lumpectomy, apparently per by oncology  notes, patient has chosen not to continue any adjuvant treatment for her breast cancer. She is also chosen not to continue with mammograms, palliative care consultation has been recommended by oncology in the past.  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 Her last oncology note, patient has possible microscopic disease, she apparently did not want to take any adjuvant treatment after her last dissection, she only wanted supportive care. - She has a history of aspiration/dysphagia secondary to this.  Given  active malignancies, and very poor overall health and failure to thrive syndrome, suspect patient would best benefit from transition to full comfort measures, and ongoing hospice care.  After a long discussion with the patient's healthcare power of attorney/partner at bedside, at this time I am not recommending admission to the hospital, patient's partner would like to see if the drain can be removed, and then would like to take her home to continue oral antibiotics at home. I have discussed this with the Gen. surgical team, ED physician.  Code Status: DO NOT RESUSCITATE  [facility] will followup later during the day after surgical evaluation.. Please contact me if I can be of assistance in the meanwhile. Thank you for this consultation.  [facility] Pager [phone]  HPI: Patient is a very unfortunate 68 year old female with a history of right breast cancer, recurrent head and neck cancer with a prolonged hospitalization recently for a perforated  jejunal ulcer requiring laparotomy complicated by development of intra-abdominal abscesses, respiratory failure and cardiac arrest. She was discharged in late January, with home hospice services. Her partner, she was doing relatively well when all of a sudden this morning she noted that the patient was short of breath  and tachycardic. Her partner tells me that the patient has chronic dysphagia and has known issues with aspiration. She was brought to the emergency room, found to be wheezing, found to be severely anemic with hemoglobin of 6.2 (last hemoglobin on 1/25 7.0), leukocytosis of 28,000 (last WBC on 1/25 24.1K), hyponatremic with a sodium of 124. The hospitalist service was then consulted for further evaluation and treatment. During my evaluation, patient appeared comfortable, still had some scattered rhonchi all over, however after a prolonged goals of care discussion by this M.D., patient's partner wanted to see if her abdominal drain could be removed, and wanted to take her home with continued hospice measures. However she did want a unit of PRBC transfused, and antibiotics for presumed aspiration pneumonia. After discussion with the ED physician, a general surgical consult has been placed. Above plan has been discussed with ED physician and general surgery team.   ALLERGIES:   Allergies  Allergen Reactions  . Ace Inhibitors Cough  . Hydrocodone Itching  . Hydromorphone Itching  . Tylox [Oxycodone-Acetaminophen] Itching  . Sulfa Antibiotics Itching and Rash    PAST MEDICAL HISTORY: Past Medical History  Diagnosis Date  . Hyperlipidemia     STATES SHE NO LONGER HAS HI CHOLESTEROL  . Rhinitis   . Fatigue 12/10/2011  . Hypothyroid 12/11/2011  . Hypertension     hx of, no current med for last 3 years  . Throat cancer 2010  . Breast  CA 2008    right breast  . Headache(784.0)     tension  . Nausea alone 04/01/2013  . Difficult airway     PAST SURGICAL HISTORY: Past Surgical History  Procedure Laterality Date  . Panendoscopy  05/16/2011    Procedure: PANENDOSCOPY;  Surgeon: Tyson Alias;  Location: [facility];  Service: ENT;  Laterality: N/A;  Direct laryngoscopy, esophagoscopy  . Radical neck dissection  05/16/2011    Procedure: RADICAL NECK DISSECTION;  Surgeon: Tyson Alias;  Location:  [place];  Service: ENT;  Laterality: Left;  Modified radical left neck dissection   . Appendectomy  yrs ago  . Cysts removed from back abd breast  yrs ago  . Breast lumpectomy Right 2008  . Esophagogastroduodenoscopy N/A 01/13/2013    Procedure: ESOPHAGOGASTRODUODENOSCOPY (EGD);  Surgeon: Cleotis Nipper, MD;  Location: [facility];  Service: Endoscopy;  Laterality: N/A;  . Balloon dilation N/A 01/13/2013    Procedure: BALLOON DILATION;  Surgeon: Cleotis Nipper, MD;  Location: [facility];  Service: Endoscopy;  Laterality: N/A;  . Laparotomy N/A 07/05/2013    Procedure: EXPLORATORY LAPAROTOMY, insertion of gastrostomy tube, repair jejunal ulcer perforation.;  Surgeon: Rolm Bookbinder, MD;  Location: [facility];  Service: General;  Laterality: N/A;  . Abdominal surgery      MEDICATIONS AT HOME: Prior to Admission medications   Medication Sig Start Date End Date Taking? Authorizing Provider  acetaminophen (TYLENOL) 160 MG/5ML elixir Take 15 mg/kg by mouth every 4 (four) hours as needed for fever.   Yes Historical Provider, MD  Alum & Mag Hydroxide-Simeth (MAGIC MOUTHWASH W/LIDOCAINE) SOLN Take 5 mLs by mouth 3 (three) times daily as needed for mouth pain. 07/23/13  Yes Emina Riebock, NP  amoxicillin-clavulanate (AUGMENTIN) 875-125 MG per tablet Take 1 tablet by mouth 2 (two) times daily. 07/27/13  Yes Megan Dort, PA-C  aspirin 81 MG tablet Take 81 mg by mouth daily.   Yes Historical Provider, MD  diclofenac (VOLTAREN) 75 MG EC tablet Take 75 mg by mouth 2 (two) times daily.  11/23/11  Yes Historical Provider, MD  diltiazem (CARDIZEM) 10 mg/ml oral suspension Place 6 mLs (60 mg total) into feeding tube every 6 (six) hours. 07/23/13  Yes Emina Riebock, NP  feeding supplement, ENSURE COMPLETE, (ENSURE COMPLETE) LIQD Place 237 mLs into feeding tube 3 (three) times daily between meals. 07/23/13  Yes Emina Riebock, NP  feeding supplement, RESOURCE BREEZE, (RESOURCE BREEZE) LIQD Take 1 Container by mouth 2  (two) times daily with a meal. 07/23/13  Yes Emina Riebock, NP  fluconazole (DIFLUCAN) 200 MG tablet Take 1 tablet (200 mg total) by mouth daily. 07/23/13  Yes Emina Riebock, NP  ipratropium-albuterol (DUONEB) 0.5-2.5 (3) MG/3ML SOLN Take 3 mLs by nebulization every 4 (four) hours as needed. 07/27/13  Yes Megan Dort, PA-C  levothyroxine (SYNTHROID, LEVOTHROID) 100 MCG tablet Take 1 tablet (100 mcg total) by mouth daily before breakfast. 07/27/13  Yes Megan Dort, PA-C  LORazepam (ATIVAN) 0.5 MG tablet Take 1 tablet (0.5 mg total) by mouth every 8 (eight) hours as needed for anxiety. 05/20/13  Yes Heath Lark, MD  magnesium hydroxide (MILK OF MAGNESIA) 800 MG/5ML suspension Take 15 mLs by mouth daily as needed for constipation.   Yes Historical Provider, MD  omeprazole (PRILOSEC) 20 MG capsule Take 1 capsule (20 mg total) by mouth daily. 06/15/13  Yes Heath Lark, MD  ondansetron (ZOFRAN-ODT) 4 MG disintegrating tablet Take 4 mg by mouth every 8 (eight) hours as needed for  nausea or vomiting.   Yes Historical Provider, MD  oxyCODONE (ROXICODONE INTENSOL) 100 MG/5ML concentrated solution Oxyfast 20mg /ml 5 mg SL q 2 hours as needed for pain or dyspnea 07/27/13  Yes Marcelle Smiling, MD  pseudoephedrine-acetaminophen (TYLENOL SINUS) 30-500 MG TABS Take 1 tablet by mouth every 4 (four) hours as needed.   Yes Historical Provider, MD  simethicone (MYLICON) 0000000 MG chewable tablet Chew 125 mg by mouth every 6 (six) hours as needed for flatulence.   Yes Historical Provider, MD  sodium chloride (OCEAN) 0.65 % SOLN nasal spray Place 1 spray into both nostrils as needed for congestion.   Yes Historical Provider, MD    FAMILY HISTORY: Family History  Problem Relation Age of Onset  . Hypertension    . Throat cancer    . Rheum arthritis    . Hypertension Mother   . Hyperlipidemia Mother   . Throat cancer Father     SOCIAL HISTORY:  reports that she quit smoking about 2 years ago. Her smoking use included  Cigarettes. She has a 80 pack-year smoking history. She has never used smokeless tobacco. She reports that she does not drink alcohol or use illicit drugs.  REVIEW OF SYSTEMS:  Constitutional:   Positive for Weight loss, cachexia. severe fatigue.  HEENT:    No headaches,Tooth/dental problems  Cardio-vascular: No chest pain,  Orthopnea, PND, swelling in lower extremities, anasarca,   dizziness, palpitations  GI:  No heartburn, indigestion, abdominal pain, nausea, vomiting, diarrhea, change in   bowel habits, loss of appetite  Resp: No hemoptysis, positive for productive cough, positive for shortness of breath  Skin:  no rash or lesions.  GU:  no dysuria, change in color of urine, no urgency or frequency.  No flank pain.  Musculoskeletal: No joint pain or swelling.  No decreased range of motion.  No back pain.  Psych: No change in mood or affect. No depression or anxiety.  No memory loss.   PHYSICAL EXAM: Blood pressure 104/66, pulse 115, temperature 100.7 F (38.2 C), temperature source Oral, resp. rate 18, SpO2 100.00%.  General appearance : Very weak, cachectic, awake, and alert, not in any distress.HEENT: Atraumatic and Normocephalic, pupils equally reactive to light and accomodation Neck: supple, no JVD. No cervical lymphadenopathy.  Chest:Good air entry bilaterally, with diffuse rhonchi all over CVS: S1 S2 regular, no murmurs. Tachycardic. Abdomen: Bowel sounds sluggish., Non tender and not distended with no gaurding, rigidity or rebound. Patient has a open midline incision wound that looks relatively clean. Has a J-tube with surrounding pus, and the abdominal drain in place. Extremities: B/L Lower Ext shows no edema, both legs are warm to touch Neurology: Awake alert, and oriented very weak, but nonfocal.  Skin:No Rash Wounds:N/A  LABS ON ADMISSION:   Recent Labs  08/20/13 0338  NA 124*  K 5.3  CL 87*  CO2 23  GLUCOSE 127*  BUN 17  CREATININE 0.52    CALCIUM 9.3    Recent Labs  08/20/13 0338  AST 25  ALT 19  ALKPHOS 248*  BILITOT <0.2*  PROT 7.3  ALBUMIN 2.2*   No results found for this basename: LIPASE, AMYLASE,  in the last 72 hours  Recent Labs  08/20/13 0338  WBC 28.2*  NEUTROABS 26.3*  HGB 6.2*  HCT 19.4*  MCV 80.8  PLT 1048*   No results found for this basename: CKTOTAL, CKMB, CKMBINDEX, TROPONINI,  in the last 72 hours No results found for this basename: DDIMER,  in  the last 72 hours No components found with this basename: POCBNP,    RADIOLOGIC STUDIES ON ADMISSION: Dg Chest 1 View  07/24/2013   CLINICAL DATA:  Status post left-sided thoracentesis. Possible aspiration.  EXAM: CHEST - 1 VIEW  COMPARISON:  07/24/2013  FINDINGS: The cardiomediastinal silhouette is unchanged. Left pleural effusion has decreased in size following thoracentesis. Moderate right pleural effusion does not appear significantly changed. No pneumothorax is identified. Patchy bibasilar lung opacities do not appear significantly changed. Mild pulmonary vascular congestion is unchanged. Small calcified nodule in the left mid lung is unchanged. Surgical clips are noted in the right axilla. Tubing overlies the left upper quadrant.  IMPRESSION: 1. Decreased size of left pleural effusion following thoracentesis. No pneumothorax identified. 2. Unchanged appearance of moderate right pleural effusion. 3. Persistent bibasilar airspace opacities without significant interval change, which may reflect atelectasis or pneumonia.   Electronically Signed   By: [name] Bores   On: 07/24/2013 16:36   Dg Chest 2 View  07/24/2013   CLINICAL DATA:  Cough and congestion with known pleural effusions  EXAM: CHEST  2 VIEW  COMPARISON:  DG CHEST 1V PORT dated 07/23/2013  FINDINGS: The lungs are adequately inflated. Coarse infrahilar lung markings bilaterally are present and may reflect atelectasis or pneumonia. Moderate-sized bilateral pleural effusions are present and may  have increased in volume on the right. There is a stable calcified nodule in the left mid lung. The cardiac silhouette is normal in size. The pulmonary vascularity is mildly prominent centrally but no definite cephalization is demonstrated. The mediastinum is normal in width. There surgical clips in the right axillary region.  IMPRESSION: 1. Moderate-sized bilateral pleural effusions are slightly more conspicuous today especially on the right. 2. Persistent bibasilar atelectasis or pneumonia is present. 3. There is no evidence of significant CHF.   Electronically Signed   By: David  Martinique   On: 07/24/2013 09:18   Ct Chest W Contrast  07/25/2013   CLINICAL DATA:  Breast carcinoma and throat carcinoma. Pleural effusions and dyspnea.  EXAM: CT CHEST WITH CONTRAST  TECHNIQUE: Multidetector CT imaging of the chest was performed during intravenous contrast administration.  CONTRAST:  5mL OMNIPAQUE IOHEXOL 300 MG/ML  SOLN  COMPARISON:  PET-CT on 09/12/2009  FINDINGS: A new large right pleural effusion is seen with diffuse right lung volume loss and consolidation. Persistent air bronchograms are seen throughout the right lung, with no evidence of central endobronchial obstruction. A new moderate left pleural effusion is also seen with mild compressive atelectasis in the posterior left lower lobe. Mild patchy areas of infiltrate or seen in the peripheral lingula and left lower lobe, but no discrete pulmonary nodules or masses are identified. There is no evidence of hilar lymphadenopathy. Mild mediastinal lymphadenopathy is noted in the right paratracheal region with largest lymph node measuring 11 mm on image 21. No other sites of mediastinal lymphadenopathy identified.  Surgical clips noted in the right axilla. No evidence chest wall mass or suspicious bone lesions. Both adrenal glands are normal in appearance.  IMPRESSION: New large right and small left pleural effusions with compressive atelectasis. Diffuse right  lung atelectasis or consolidation noted, without evidence of centrally obstructing mass.  Mild patchy areas of infiltrate in peripheral left upper and lower lobes. Pneumonia or other inflammatory process cannot be excluded.  Mild right paratracheal mediastinal lymphadenopathy, which is nonspecific. This may be reactive in etiology although metastatic disease cannot be excluded.   Electronically Signed   By: Earle Gell  M.D.   On: 07/25/2013 18:04   Ct Abdomen Pelvis W Contrast  07/21/2013   CLINICAL DATA:  History of abdominal abscess.  EXAM: CT ABDOMEN AND PELVIS WITH CONTRAST  TECHNIQUE: Multidetector CT imaging of the abdomen and pelvis was performed using the standard protocol following bolus administration of intravenous contrast.  CONTRAST:  47mL OMNIPAQUE IOHEXOL 300 MG/ML  SOLN  COMPARISON:  07/15/2013  FINDINGS: Bilateral pleural effusions are again identified left greater than right. There is overlying compressive type consolidation and atelectasis.  No focal liver abnormality identified. The gallbladder appears normal. No biliary dilatation. Normal appearance of the pancreas. The left upper quadrant subphrenic abscess which has mass effect upon the spleen is again noted. This measures 6.7 x 2.5 cm, image 64/ coronal series. On the previous examination this measured 7.3 x 2.1 cm. Calcified granulomas within the splenic parenchyma are again noted.  Normal appearance of the adrenal glands. The kidneys are both on unremarkable. Urinary bladder appears normal. Partially calcified fibroid uterus is again noted.  There is calcified atherosclerotic disease involving the abdominal aorta. No aneurysm identified. Multiple reactive lymph nodes are noted in the upper abdomen. No significant adenopathy. No pelvic or inguinal adenopathy noted. There is a left upper quadrant pigtail drainage catheter identified. The associated a bilobed fluid collection is again noted. This measures 5.9 x 2.7 cm, image 41/series 2.  Previously this measured 7.2 x 3.5 cm. Adjacent fluid collection posterior to the stomach measures 3 x 1.2 cm, image 40/series 2. Previously this measured 3 x 1.4 cm. Foci of extraluminal gas within the left abdomen appear associated with small bowel loops may indicate presence of fistula or sinus tract with the adjacent colon, image 50/series 2.  Gastrostomy tube is identified is identified within the stomach. The small bowel loops have a normal caliber. There is no evidence for bowel obstruction. Enteric contrast material reaches the colon and rectum.  Review of the visualized osseous structures is significant for mild multilevel spondylosis. 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  IMPRESSION: 1. Mild decrease in size of small bowel mesenteric abscess the pigtail drainage catheter remains in place. 2. No significant change in subphrenic abscess which has mass effect upon the spleen. 3. Again noted are foci of extraluminal gas within the left abdomen which may indicate the presence of a sinus tract or fistula between small enlarged bowel loops. No extravasation of contrast material is identified. 4. Bilateral pleural effusions.   Electronically Signed   By: Kerby Moors M.D.   On: 07/21/2013 10:07   Dg Chest Port 1 View  08/20/2013   CLINICAL DATA:  Shortness of breath.  EXAM: PORTABLE CHEST - 1 VIEW  COMPARISON:  07/25/2013  FINDINGS: Hyperinflation of the lungs compatible with COPD. Improved bilateral airspace opacities. Minimal residual bibasilar opacities. Small effusions. Heart is upper limits normal in size.  IMPRESSION: Improving bilateral airspace disease. Minimal residual bibasilar atelectasis or infiltrate. Small effusions.  COPD.   Electronically Signed   By: Rolm Baptise M.D.   On: 08/20/2013 04:05   Dg Chest Port 1 View  07/25/2013   CLINICAL DATA:  Labored breathing.  EXAM: PORTABLE CHEST - 1 VIEW  COMPARISON:  Chest radiograph performed 07/24/2013  FINDINGS: Small to  moderate right and small left pleural effusions are seen, with bibasilar airspace opacification, worse on the right. This likely reflects pulmonary edema, though superimposed pneumonia cannot be excluded. No pneumothorax is seen. Apparent bilateral nipple shadows are noted.  The cardiomediastinal silhouette is  borderline normal in size. No acute osseous abnormalities are identified. Scattered clips are seen at the right axilla.  IMPRESSION: Small to moderate right and small left pleural effusions, with bibasilar airspace opacification, worse on the right. This likely reflects pulmonary edema, though superimposed pneumonia cannot be excluded. Findings are relatively stable from the prior study.   Electronically Signed   By: Garald Balding M.D.   On: 07/25/2013 02:55   Dg Chest Port 1 View  07/23/2013   CLINICAL DATA:  Leukocytosis.  EXAM: PORTABLE CHEST - 1 VIEW  COMPARISON:  07/15/2013 and 05/16/2011  FINDINGS: Examination demonstrates worsening bibasilar opacification likely combination of airspace disease with small effusions left greater than right. Dense 6 mm nodule over the left midlung unchanged from 2012. There is mild cardiomegaly. Remainder the exam is unchanged.  IMPRESSION: Worsening bibasilar opacification likely infection. Worsening small bilateral effusions left worse than right.  Mild cardiomegaly.   Electronically Signed   By: Marin Olp M.D.   On: 07/23/2013 08:05   US Thoracentesis Asp Pleural Space W/img Guide  07/24/2013   CLINICAL DATA:  Shortness of breath, pleural effusion.  EXAM: ULTRASOUND GUIDED left THORACENTESIS  COMPARISON:  None.  FINDINGS: A total of approximately 500 ml of cloudy serous fluid was removed. A fluid sample was sent for laboratory analysis.  IMPRESSION: Successful ultrasound guided left thoracentesis yielding 500 ml of pleural fluid.  Read By:  Tsosie Billing PA-C  PROCEDURE: An ultrasound guided thoracentesis was thoroughly discussed with the patient and  questions answered. The benefits, risks, alternatives and complications were also discussed. The patient understands and wishes to proceed with the procedure. Written consent was obtained.  Ultrasound was performed to localize and mark an adequate pocket of fluid in the left chest. The area was then prepped and draped in the normal sterile fashion. 1% Lidocaine was used for local anesthesia. Under ultrasound guidance a 19 gauge Yueh catheter was introduced. Thoracentesis was performed. The catheter was removed and a dressing applied.  Complications:  none.   Electronically Signed   By: Jacqulynn Cadet M.D.   On: 07/24/2013 16:57     Total time spent 45 minutes.  [facility] Pager [phone]  If 7PM-7AM, please contact night-coverage [email or web address] Password [email or web address] 08/20/2013, 8:03 AM

## 2013-08-20 NOTE — ED Notes (Signed)
Pt's caregiver states that morphine is ok to give the patient.

## 2013-08-20 NOTE — ED Notes (Signed)
Critical hemoglobin 6.2 and platelets 1048. Dr Sherol Dade informed.

## 2013-08-21 LAB — TYPE AND SCREEN
ABO/RH(D): B POS
Antibody Screen: NEGATIVE
UNIT DIVISION: 0

## 2013-08-26 LAB — CULTURE, BLOOD (ROUTINE X 2)
Culture: NO GROWTH
Culture: NO GROWTH

## 2013-09-03 ENCOUNTER — Telehealth (INDEPENDENT_AMBULATORY_CARE_PROVIDER_SITE_OTHER): Payer: Self-pay | Admitting: General Surgery

## 2013-09-03 NOTE — Telephone Encounter (Signed)
Milus Banister, nurse at [facility], called to report pain at the insertion site of the pt's G-tube.  She describes pain at any movement of the tube and it is excoriated all around the insertion site.  No drainage reported.  Paged and updated Dr. Donne Hazel; will assess the tube in the office.  Offered appt on Friday, 09/04/13 at 11:00.  LM on VM for Milus Banister to return my call.

## 2013-09-04 ENCOUNTER — Encounter (INDEPENDENT_AMBULATORY_CARE_PROVIDER_SITE_OTHER): Payer: Self-pay | Admitting: General Surgery

## 2013-09-04 ENCOUNTER — Telehealth (INDEPENDENT_AMBULATORY_CARE_PROVIDER_SITE_OTHER): Payer: Self-pay

## 2013-09-04 ENCOUNTER — Ambulatory Visit (INDEPENDENT_AMBULATORY_CARE_PROVIDER_SITE_OTHER): Payer: Medicare Other | Admitting: General Surgery

## 2013-09-04 ENCOUNTER — Other Ambulatory Visit (INDEPENDENT_AMBULATORY_CARE_PROVIDER_SITE_OTHER): Payer: Self-pay | Admitting: General Surgery

## 2013-09-04 ENCOUNTER — Ambulatory Visit (HOSPITAL_COMMUNITY)
Admission: RE | Admit: 2013-09-04 | Discharge: 2013-09-04 | Disposition: A | Discharge status: Home / Self Care | Source: Ambulatory Visit | Attending: General Surgery | Admitting: General Surgery

## 2013-09-04 DIAGNOSIS — K9423 Gastrostomy malfunction: Secondary | ICD-10-CM

## 2013-09-04 DIAGNOSIS — Y849 Medical procedure, unspecified as the cause of abnormal reaction of the patient, or of later complication, without mention of misadventure at the time of the procedure: Secondary | ICD-10-CM | POA: Insufficient documentation

## 2013-09-04 DIAGNOSIS — Z09 Encounter for follow-up examination after completed treatment for conditions other than malignant neoplasm: Secondary | ICD-10-CM

## 2013-09-04 MED ORDER — NYSTATIN 100000 UNIT/GM EX POWD: CUTANEOUS | Status: DC

## 2013-09-04 MED ORDER — IOHEXOL 300 MG/ML  SOLN
50.0000 mL | Freq: Once | INTRAMUSCULAR | Status: AC | PRN
  Administered 2013-09-04: 10 mL

## 2013-09-04 NOTE — Procedures (Signed)
Successful exchange of 24Fr balloon retention gastrostomy tube with imaging. No complications. See PACS/Imaging for full report.  Ascencion Dike PA-C Interventional Radiology 09/04/2013 12:53 PM

## 2013-09-04 NOTE — Progress Notes (Signed)
Subjective:     Patient ID: Taylor [name], female   DOB: August 25, 1945, 68 y.o.   MRN: [id number]  HPI 68 year old female who is now in home hospice who underwent a patch repair of a perforated jejunal ulcer with a gastrostomy tube placed at the same time. She is doing okay now. She is able to swallow a little but as aspiration issues and is mostly using the gastrostomy right now. She has had some drainage around the gastrostomy as well as some smell of this fluid. This area is very painful and that is what she comes in today for.  Review of Systems     Objective:   Physical Exam Midline wound with some exudate but no evidence of infection Gastrostomy tube with some appearance of a yeast infection as well as granulation tissue and some leakage around the tube.    Assessment:     Status post patch repair of ulcer and gastrostomy tube, gastrostomy tube malfunction     Plan:     I will have IR replac her tube. I did place silver nitrate around this today also as I think that will help. I should place a dressing on it also. She is to continue wet to dry dressings. I gave her some nystatin powder I will see back in 2 weeks.

## 2013-09-04 NOTE — Telephone Encounter (Signed)
Called rx in to pharmacy per pt's request for Nystatin 100000 unit/GM Powder apply powder to g tube site twice daily as needed per Dr Donne Hazel.

## 2013-09-21 ENCOUNTER — Encounter (INDEPENDENT_AMBULATORY_CARE_PROVIDER_SITE_OTHER): Payer: Self-pay | Admitting: General Surgery

## 2013-09-21 ENCOUNTER — Ambulatory Visit (INDEPENDENT_AMBULATORY_CARE_PROVIDER_SITE_OTHER): Payer: Medicare Other | Admitting: General Surgery

## 2013-09-21 DIAGNOSIS — Z09 Encounter for follow-up examination after completed treatment for conditions other than malignant neoplasm: Secondary | ICD-10-CM

## 2013-09-21 MED ORDER — CEPHALEXIN 250 MG/5ML PO SUSR: 500.0000 mg | Freq: Two times a day (BID) | ORAL | Status: DC

## 2013-09-21 NOTE — Progress Notes (Signed)
Subjective:     Patient ID: Taylor [name], female   DOB: 1946/02/09, 68 y.o.   MRN: [id number]  HPI 68 year old female who is now in home hospice who underwent a patch repair of a perforated jejunal ulcer with a gastrostomy tube placed at the same time. She is doing okay now. She is able to swallow a little but has aspiration issues and is mostly using the gastrostomy right now. She has had some drainage around the gastrostomy as well as some smell of this fluid. I had her to replace the last time I saw her. Since then she is doing a little bit better. This tube  has been needed to be snugged down a little bit. She still has some drainage mostly after she eats when she is constipated. She is not venting this tube at all right now. She otherwise has remained stable. She does have some redness around the superior aspect her incision which has not been there. She has no drainage from her incision. She has no fevers.   Review of Systems EXAM:  FLUOROSCOPIC GUIDED REPLACEMENT OF GASTROSTOMY TUBE  MEDICATIONS:  None.  CONTRAST: 10 mL OMNIPAQUE IOHEXOL 300 MG/ML SOLN  COMPARISON: None.  FLUOROSCOPY TIME: 0 minutes. 18 seconds.  PROCEDURE:  Informed written consent was obtained from the patient after a  discussion of the risks, benefits and alternatives to treatment.  Questions regarding the procedure were encouraged and answered. A  timeout was performed prior to the initiation of the procedure.  The upper abdomen and external portion of the existing gastrostomy  tube was prepped and draped in the usual sterile fashion, and a  sterile drape was applied covering the operative field. Maximum  barrier sterile technique with sterile gowns and gloves were used  for the procedure. A timeout was performed prior to the initiation  of the procedure.  The existing gastrostomy tube was injected with a small amount of  contrast confirming appropriate positioning within the gastric  lumen. The existing  tube was removed without difficulty. The patient  did get some relief when the suture was removed. A new 24 French  balloon retention gastrostomy tube was inserted without difficulty  and the balloon was inflated and disc was cinched. Contrast was  injected and a spot fluoroscopic image was obtained to confirm  appropriate intraluminal positioning. A dressing was placed. The  patient tolerated the procedure well without immediate  postprocedural complication.  COMPLICATIONS:  None immediate  IMPRESSION:  Successful fluoroscopic guided replacement of a new 20-French  gastrostomy tube. The new gastrostomy tube is ready for immediate  use.     Objective:   Physical Exam Wound superficially open with some erythema at superior aspect g tube site with granulation tissue, not snugged down     Assessment:     S/p perforated ulcer Hospice care      Plan:     We had a long discussion today with her hospice nurse on how to treat all of this. I did place some silver nitrate around the granulation tissue next to the tube. Also placed her on some Keflex for the redness around her incision. I don't think this is anything more. I think she she has some gastroparesis or potentially even a partial obstruction somewhere. I discussed with the normal evaluation for this and because she is in hospice care were not going to proceed with that. She does not want more studies.  discussed MiraLAX with her I also discussed making her G-tube  vent as needed.  We discussed and performed a dressing change also. Will try these things and then she's going to call me back and update me.

## 2013-09-30 ENCOUNTER — Telehealth: Payer: Self-pay | Admitting: *Deleted

## 2013-09-30 ENCOUNTER — Encounter: Payer: Self-pay | Admitting: Hematology and Oncology

## 2013-09-30 ENCOUNTER — Other Ambulatory Visit (HOSPITAL_BASED_OUTPATIENT_CLINIC_OR_DEPARTMENT_OTHER)

## 2013-09-30 ENCOUNTER — Ambulatory Visit (HOSPITAL_BASED_OUTPATIENT_CLINIC_OR_DEPARTMENT_OTHER): Admitting: Hematology and Oncology

## 2013-09-30 VITALS — BP 93/58 | HR 85 | Temp 97.8°F | Resp 20

## 2013-09-30 DIAGNOSIS — C140 Malignant neoplasm of pharynx, unspecified: Secondary | ICD-10-CM

## 2013-09-30 DIAGNOSIS — E039 Hypothyroidism, unspecified: Secondary | ICD-10-CM

## 2013-09-30 DIAGNOSIS — F411 Generalized anxiety disorder: Secondary | ICD-10-CM

## 2013-09-30 DIAGNOSIS — Z853 Personal history of malignant neoplasm of breast: Secondary | ICD-10-CM

## 2013-09-30 DIAGNOSIS — D649 Anemia, unspecified: Secondary | ICD-10-CM

## 2013-09-30 DIAGNOSIS — E871 Hypo-osmolality and hyponatremia: Secondary | ICD-10-CM

## 2013-09-30 DIAGNOSIS — K275 Chronic or unspecified peptic ulcer, site unspecified, with perforation: Secondary | ICD-10-CM

## 2013-09-30 DIAGNOSIS — D473 Essential (hemorrhagic) thrombocythemia: Secondary | ICD-10-CM

## 2013-09-30 DIAGNOSIS — D638 Anemia in other chronic diseases classified elsewhere: Secondary | ICD-10-CM

## 2013-09-30 DIAGNOSIS — E46 Unspecified protein-calorie malnutrition: Secondary | ICD-10-CM

## 2013-09-30 DIAGNOSIS — R110 Nausea: Secondary | ICD-10-CM

## 2013-09-30 DIAGNOSIS — C12 Malignant neoplasm of pyriform sinus: Secondary | ICD-10-CM

## 2013-09-30 LAB — CBC WITH DIFFERENTIAL/PLATELET
BASO%: 1.1 % (ref 0.0–2.0)
Basophils Absolute: 0.1 10*3/uL (ref 0.0–0.1)
EOS ABS: 0.3 10*3/uL (ref 0.0–0.5)
EOS%: 4.5 % (ref 0.0–7.0)
HEMATOCRIT: 24.1 % — AB (ref 34.8–46.6)
HGB: 7.9 g/dL — ABNORMAL LOW (ref 11.6–15.9)
LYMPH%: 10.1 % — ABNORMAL LOW (ref 14.0–49.7)
MCH: 26.8 pg (ref 25.1–34.0)
MCHC: 32.6 g/dL (ref 31.5–36.0)
MCV: 82.1 fL (ref 79.5–101.0)
MONO#: 1.2 10*3/uL — AB (ref 0.1–0.9)
MONO%: 16.7 % — ABNORMAL HIGH (ref 0.0–14.0)
NEUT%: 67.6 % (ref 38.4–76.8)
NEUTROS ABS: 5 10*3/uL (ref 1.5–6.5)
Platelets: 625 10*3/uL — ABNORMAL HIGH (ref 145–400)
RBC: 2.94 10*6/uL — AB (ref 3.70–5.45)
RDW: 19.2 % — ABNORMAL HIGH (ref 11.2–14.5)
WBC: 7.4 10*3/uL (ref 3.9–10.3)
lymph#: 0.7 10*3/uL — ABNORMAL LOW (ref 0.9–3.3)

## 2013-09-30 LAB — COMPREHENSIVE METABOLIC PANEL (CC13)
ALT: 13 U/L (ref 0–55)
ANION GAP: 8 meq/L (ref 3–11)
AST: 16 U/L (ref 5–34)
Albumin: 3 g/dL — ABNORMAL LOW (ref 3.5–5.0)
Alkaline Phosphatase: 120 U/L (ref 40–150)
BUN: 13.7 mg/dL (ref 7.0–26.0)
CALCIUM: 9.3 mg/dL (ref 8.4–10.4)
CO2: 23 meq/L (ref 22–29)
Chloride: 99 mEq/L (ref 98–109)
Creatinine: 0.6 mg/dL (ref 0.6–1.1)
GLUCOSE: 103 mg/dL (ref 70–140)
Potassium: 4.3 mEq/L (ref 3.5–5.1)
SODIUM: 130 meq/L — AB (ref 136–145)
TOTAL PROTEIN: 6.9 g/dL (ref 6.4–8.3)
Total Bilirubin: 0.2 mg/dL (ref 0.20–1.20)

## 2013-09-30 LAB — TSH CHCC: TSH: 0.956 m[IU]/L (ref 0.308–3.960)

## 2013-09-30 LAB — T4, FREE: Free T4: 1.3 ng/dL (ref 0.80–1.80)

## 2013-09-30 NOTE — Telephone Encounter (Signed)
Message copied by Cathlean Cower on Wed Sep 30, 2013  4:02 PM ------      Message from: [facility], [place]      Created: Wed Sep 30, 2013  1:52 PM      Regarding: thyroid function test       Please call her with test result: normal      Continue current dose of thyroid supplement ------

## 2013-09-30 NOTE — Telephone Encounter (Signed)
Left VM informing of thyroid level wnl and continue same dose of synthroid.  Please call back if any questions.

## 2013-09-30 NOTE — Progress Notes (Signed)
[facility] OFFICE PROGRESS NOTE  Patient Care Team: Burnis Medin, MD as PCP - General Blair Promise, MD (Radiation Oncology) Almedia Balls, MD (Orthopedic Surgery) Cleotis Nipper, MD (Gastroenterology)  DIAGNOSIS: debility from recent perforated ulcer, malnutrition, severe anemia  SUMMARY OF ONCOLOGIC HISTORY: 1. 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 She received concurrent chemoradiation therapy with weekly cisplatin, which started 04/18/2009 and ended 06/06/2009. Treatment dates were from April 18, 2009 through June 13, 2009. She had local recurrence and underwent on 05/16/2011 modified level IV left neck dissection with positive margin and gross muscle invasion. She has not been given any form of adjuvant treatment since then 2. 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 She was treated with three cycles of Cytoxan, Taxotere and could not finish all six planned cycles secondary to severe myalgia/arthralgia. She received adjuvant radiation therapy and was started on Femara from which she was nonadherent until she was diagnosed with piriform sinus SCC. She is now again on adjuvant Femara since 2011. When her head and neck cancer recurred in 2012, she discontinue Femara and screening mammogram   INTERVAL HISTORY: Taylor [name] 68 y.o. female returns for follow-up. I reviewed her recent records from the hospital. She had perforated  Ulcer complicated by abdominal abscess, recurrent aspiration pneumonia, failure to thrive and severe cbc abnormalities with leukocytosis, thrombocytosis and anemia requiring blood transfusions.  She came in today on a stretcher with assistance from ambulance. She is currently enrolled in home hospice program. Her principal caregiver  and partner, [name] is with her. According to [name], she is improving with resolution of recent cough related to her pneumonia. She complained of difficulties eating due to tooth pain and profound fatigue. She is bedbound almost 24 hours a day but started to get a it stronger and able to use a walker. She is dependent on PEG tube for nutritional intake. The patient denies any recent signs or symptoms of bleeding such as spontaneous epistaxis, hematuria or hematochezia. She denies any recent fever, chills, night sweats or abnormal weight loss  I have reviewed the past medical history, past surgical history, social history and family history with the patient and they are unchanged from previous note.  ALLERGIES:  is allergic to ace inhibitors; hydrocodone; hydromorphone; tylox; and sulfa antibiotics.  MEDICATIONS:  Current Outpatient Prescriptions  Medication Sig Dispense Refill  . acetaminophen (TYLENOL) 160 MG/5ML elixir Take 15 mg/kg by mouth every 4 (four) hours as needed for fever.      Marland Kitchen aspirin 81 MG tablet Take 81 mg by mouth daily.      . feeding supplement, ENSURE COMPLETE, (ENSURE COMPLETE) LIQD Place 237 mLs into feeding tube 4 (four) times daily.      . feeding supplement, RESOURCE BREEZE, (RESOURCE BREEZE) LIQD Take 1 Container by mouth 2 (two) times daily.      . fluconazole (DIFLUCAN) 150 MG tablet Take 150 mg by mouth once. Take 1 tablet once a week.      Marland Kitchen ipratropium-albuterol (DUONEB) 0.5-2.5 (3) MG/3ML SOLN Take 3 mLs by nebulization every 4 (four) hours as needed.  360 mL  0  . levothyroxine (SYNTHROID, LEVOTHROID) 112 MCG tablet Take 112 mcg by mouth daily before breakfast.      . LORazepam (ATIVAN) 0.5 MG tablet Take 0.5 mg  by mouth every 4 (four) hours as needed for anxiety.      . meloxicam (MOBIC) 15 MG tablet Take 15 mg by mouth daily.      . Menthol-Zinc Oxide (CALMOSEPTINE EX) Apply topically 4 (four) times daily as needed (Barrier Ointment).      . ondansetron  (ZOFRAN-ODT) 4 MG disintegrating tablet Take 4 mg by mouth every 8 (eight) hours as needed for nausea or vomiting.      Marland Kitchen oxyCODONE (ROXICODONE INTENSOL) 100 MG/5ML concentrated solution Oxyfast 63m/ml 5 mg SL q 2 hours as needed for pain or dyspnea  30 mL  0  . polyethylene glycol (MIRALAX / GLYCOLAX) packet Take 17 g by mouth daily as needed.      . pseudoephedrine-acetaminophen (TYLENOL SINUS) 30-500 MG TABS Take 1 tablet by mouth every 4 (four) hours as needed.      . ranitidine (ZANTAC) 15 MG/ML syrup Take 15 mg by mouth 2 (two) times daily. Take 166mby mouth BID.      . Marland Kitchenodium chloride (OCEAN) 0.65 % SOLN nasal spray Place 1 spray into both nostrils as needed for congestion.       No current facility-administered medications for this visit.    REVIEW OF SYSTEMS:   Eyes: Denies blurriness of vision Ears, nose, mouth, throat, and face: Denies mucositis or sore throat Cardiovascular: Denies palpitation, chest discomfort or lower extremity swelling Skin: Denies abnormal skin rashes Lymphatics: Denies new lymphadenopathy or easy bruising Neurological:Denies numbness, tingling or new weaknesses Behavioral/Psych: Mood is stable, no new changes  All other systems were reviewed with the patient and are negative.  PHYSICAL EXAMINATION: ECOG PERFORMANCE STATUS: 4 - Bedbound  Filed Vitals:   09/30/13 0928  BP: 93/58  Pulse: 85  Temp: 97.8 F (36.6 C)  Resp: 20   There were no vitals filed for this visit.  GENERAL:alert, no distress and comfortable on a stretcher SKIN: skin color, texture, turgor are normal, no rashes or significant lesions EYES: normal, Conjunctiva are pale and non-injected, sclera clear OROPHARYNX:no exudate, no erythema and lips, buccal mucosa, and tongue normal . Very poor dentition is noted. NECK: significant surgical scars were notes LYMPH:  no palpable lymphadenopathy in the cervical, axillary or inguinal LUNGS: clear to auscultation and percussion with  normal breathing effort HEART: regular rate & rhythm and no murmurs and no lower extremity edema ABDOMEN:abdomen soft, non-tender and normal bowel sounds. Feeding tube site looks OK Musculoskeletal:no cyanosis of digits and no clubbing  NEURO: alert & oriented x 3 with fluent speech, no focal motor/sensory deficits  LABORATORY DATA:  I have reviewed the data as listed    Component Value Date/Time   NA 130* 09/30/2013 0917   NA 124* 08/20/2013 0338   NA 136 03/14/2011 0954   K 4.3 09/30/2013 0917   K 5.3 08/20/2013 0338   K 5.5* 03/14/2011 0954   CL 87* 08/20/2013 0338   CL 96* 10/08/2012 0911   CL 96* 03/14/2011 0954   CO2 23 09/30/2013 0917   CO2 23 08/20/2013 0338   CO2 26 03/14/2011 0954   GLUCOSE 103 09/30/2013 0917   GLUCOSE 127* 08/20/2013 0338   GLUCOSE 112* 10/08/2012 0911   GLUCOSE 113 03/14/2011 0954   BUN 13.7 09/30/2013 0917   BUN 17 08/20/2013 0338   BUN 11 03/14/2011 0954   CREATININE 0.6 09/30/2013 0917   CREATININE 0.52 08/20/2013 0338   CREATININE 0.5* 03/14/2011 0954   CALCIUM 9.3 09/30/2013 0917   CALCIUM 9.3 08/20/2013  0037   CALCIUM 9.5 03/14/2011 0954   PROT 6.9 09/30/2013 0917   PROT 7.3 08/20/2013 0338   PROT 8.0 03/14/2011 0954   ALBUMIN 3.0* 09/30/2013 0917   ALBUMIN 2.2* 08/20/2013 0338   AST 16 09/30/2013 0917   AST 25 08/20/2013 0338   AST 30 03/14/2011 0954   ALT 13 09/30/2013 0917   ALT 19 08/20/2013 0338   ALT 22 03/14/2011 0954   ALKPHOS 120 09/30/2013 0917   ALKPHOS 248* 08/20/2013 0338   ALKPHOS 101* 03/14/2011 0954   BILITOT 0.20 09/30/2013 0917   BILITOT <0.2* 08/20/2013 0338   BILITOT 0.50 03/14/2011 0954   GFRNONAA >90 08/20/2013 0338   GFRAA >90 08/20/2013 0338    No results found for this basename: SPEP,  UPEP,   kappa and lambda light chains    Lab Results  Component Value Date   WBC 7.4 09/30/2013   NEUTROABS 5.0 09/30/2013   HGB 7.9* 09/30/2013   HCT 24.1* 09/30/2013   MCV 82.1 09/30/2013   PLT 625* 09/30/2013      Chemistry      Component Value Date/Time   NA 130*  09/30/2013 0917   NA 124* 08/20/2013 0338   NA 136 03/14/2011 0954   K 4.3 09/30/2013 0917   K 5.3 08/20/2013 0338   K 5.5* 03/14/2011 0954   CL 87* 08/20/2013 0338   CL 96* 10/08/2012 0911   CL 96* 03/14/2011 0954   CO2 23 09/30/2013 0917   CO2 23 08/20/2013 0338   CO2 26 03/14/2011 0954   BUN 13.7 09/30/2013 0917   BUN 17 08/20/2013 0338   BUN 11 03/14/2011 0954   CREATININE 0.6 09/30/2013 0917   CREATININE 0.52 08/20/2013 0338   CREATININE 0.5* 03/14/2011 0954      Component Value Date/Time   CALCIUM 9.3 09/30/2013 0917   CALCIUM 9.3 08/20/2013 0338   CALCIUM 9.5 03/14/2011 0954   ALKPHOS 120 09/30/2013 0917   ALKPHOS 248* 08/20/2013 0338   ALKPHOS 101* 03/14/2011 0954   AST 16 09/30/2013 0917   AST 25 08/20/2013 0338   AST 30 03/14/2011 0954   ALT 13 09/30/2013 0917   ALT 19 08/20/2013 0338   ALT 22 03/14/2011 0954   BILITOT 0.20 09/30/2013 0917   BILITOT <0.2* 08/20/2013 0338   BILITOT 0.50 03/14/2011 0954     ASSESSMENT & PLAN:  #1 Recent perforated ulcer She had surgery recently and declined further intervention. She is currently enrolled in hospice due to poor performance status requiring 24 hour care #2 History of breast cancer The patient does not want to continue any form of adjuvant treatment for this. We will continue supportive care only.  #3 history of recurrent head and neck cancer The patient has possible microscopic disease. She does not want to take any adjuvant treatment after her last resection. The patient wants supportive care only. We will not order any imaging study #4 hypothyroidism I have recheck a thyroid function test today. The patient is instructed to continue current dose.  #5 Severe anemia, combination of possible GI bleed and anemia of severe illness/chronic disease I have a long discussion with the patient regarding the risks and benefits of blood transfusion and ultimately made a decision to not transfuse today. I recommend [name] to give her iron supplement.  #6 mild  thrombocytosis This could be reactive thrombosis. 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 She is not symptomatic, observe only #  9 Future follow-up plans Patient thinks she will get better to the point to of her baseline 6 months ago. I will arrange for her to return in 6 months with repeat history, physical exam and blood work   Orders Placed This Encounter  Procedures  . CBC with Differential    Standing Status: Future     Number of Occurrences:      Standing Expiration Date: 09/30/2014  . Comprehensive metabolic panel    Standing Status: Future     Number of Occurrences:      Standing Expiration Date: 09/30/2014  . T4, free    Standing Status: Future     Number of Occurrences:      Standing Expiration Date: 09/30/2014  . TSH    Standing Status: Future     Number of Occurrences:      Standing Expiration Date: 09/30/2014   All questions were answered. The patient knows to call the clinic with any problems, questions or concerns. No barriers to learning was detected. I spent 40 minutes counseling the patient face to face. The total time spent in the appointment was 55 minutes and more than 50% was on counseling and review of test results     [facility], [place], MD 09/30/2013 9:10 PM

## 2013-10-01 ENCOUNTER — Telehealth: Payer: Self-pay | Admitting: *Deleted

## 2013-10-01 ENCOUNTER — Telehealth: Payer: Self-pay | Admitting: Hematology and Oncology

## 2013-10-01 NOTE — Telephone Encounter (Signed)
I addressed this, they forgot. OK to proceed

## 2013-10-01 NOTE — Telephone Encounter (Signed)
Pt's caregiver/partner called to ask if ok for pt to see dentist regarding a broken tooth?  The tooth is causing some pain as it is cutting her tongue and cheek.

## 2013-10-01 NOTE — Telephone Encounter (Signed)
per pof to sch labs & dr appt in 11mths per NG/sch appt and cld # on acct and talkd w/ Carol(caegiver) gave appt time & date

## 2013-10-01 NOTE — Telephone Encounter (Signed)
Informed Taylor [name] ok for pt to see dentist for broken tooth.  She verbalized understanding.

## 2013-10-07 ENCOUNTER — Telehealth (INDEPENDENT_AMBULATORY_CARE_PROVIDER_SITE_OTHER): Payer: Self-pay

## 2013-10-07 NOTE — Telephone Encounter (Signed)
LMOM giving verbal order for the refill on Cephalexin by mouth suspension reconstitututed 250mg /ml 10 mililiter 2 times a day PEG tube use 12mls 2xday for 7 days [id number] at incision site per Dr Donne Hazel. I advised to call me if any questions.

## 2013-10-07 NOTE — Telephone Encounter (Signed)
Tried calling but not able to get thru b/c technical difficulties per the message.

## 2013-10-07 NOTE — Telephone Encounter (Signed)
Pts POA [name] called stating the redness at incision site has returned. On 3/23 keflex was prescribed for this and the redness went away. She is asking for refill of the liquid keflex since symptoms have returned. No other symptoms except the redness. Please advise.

## 2013-10-15 ENCOUNTER — Other Ambulatory Visit (HOSPITAL_COMMUNITY): Payer: Self-pay | Admitting: Hematology

## 2013-10-15 DIAGNOSIS — C329 Malignant neoplasm of larynx, unspecified: Secondary | ICD-10-CM

## 2013-10-15 DIAGNOSIS — R52 Pain, unspecified: Secondary | ICD-10-CM

## 2013-10-16 ENCOUNTER — Ambulatory Visit (HOSPITAL_COMMUNITY)
Admission: RE | Admit: 2013-10-16 | Discharge: 2013-10-16 | Disposition: A | Discharge status: Home / Self Care | Source: Ambulatory Visit | Attending: Hematology | Admitting: Hematology

## 2013-10-16 DIAGNOSIS — K9429 Other complications of gastrostomy: Secondary | ICD-10-CM | POA: Insufficient documentation

## 2013-10-16 DIAGNOSIS — R52 Pain, unspecified: Secondary | ICD-10-CM

## 2013-10-16 DIAGNOSIS — C329 Malignant neoplasm of larynx, unspecified: Secondary | ICD-10-CM

## 2013-10-16 DIAGNOSIS — Y849 Medical procedure, unspecified as the cause of abnormal reaction of the patient, or of later complication, without mention of misadventure at the time of the procedure: Secondary | ICD-10-CM | POA: Insufficient documentation

## 2013-10-16 MED ORDER — IOHEXOL 300 MG/ML  SOLN
50.0000 mL | Freq: Once | INTRAMUSCULAR | Status: AC | PRN
Start: 2013-10-16 — End: 2013-10-16
  Administered 2013-10-16: 20 mL

## 2013-11-05 ENCOUNTER — Telehealth (INDEPENDENT_AMBULATORY_CARE_PROVIDER_SITE_OTHER): Payer: Self-pay | Admitting: General Surgery

## 2013-11-05 ENCOUNTER — Other Ambulatory Visit (INDEPENDENT_AMBULATORY_CARE_PROVIDER_SITE_OTHER): Payer: Self-pay | Admitting: General Surgery

## 2013-11-05 NOTE — Telephone Encounter (Signed)
I don't believe I know this patient. ? Dr. Donne Hazel?

## 2013-11-05 NOTE — Telephone Encounter (Signed)
Pt's caregiver called with question/ request for Dr. Excell Seltzer.  Pt is now losing weight rapidly; has lost 10 lbs over last two weeks.  She is still having gross skin breakdown around the feeding tube as well as leakage there.  They "are watching her just fade away."  [name] asks if she can be scheduled for a study to determine if she is a candidate for a J-tube or if this is just the cancer now.  They want to try to improve her nutrition if this is at all possible.  Please call her or advise.

## 2013-11-05 NOTE — Telephone Encounter (Signed)
Spoke with patient's caregiver and explained that Dr. Donne Hazel will not place a J-tube because he does not believe that she will tolerate it. She explained that she understands this but that she has major concern about her nutritional status.  Informed her that because a J-tube cannot go in, there might not be much more we could do to help improve this.  They also explained that around the J-tube is leaking again.  She described it as a wider opening where it looks like it has "split" along one side.  Informed her that their hospice nurse should be taking care of this with covering around the tube. Informed her I would send this message to Dr. Donne Hazel to update him on this matter.  She would like some guidance as to where they should go next with the patient's care.  I made sure to explain that because the patient is a hospice patient, no extreme matters would be taken and that we could only work on comfort.

## 2013-11-06 ENCOUNTER — Other Ambulatory Visit (INDEPENDENT_AMBULATORY_CARE_PROVIDER_SITE_OTHER): Payer: Self-pay | Admitting: General Surgery

## 2013-11-06 MED ORDER — SILVER NITRATE-POT NITRATE 75-25 % EX MISC: Freq: Every day | CUTANEOUS | Status: AC | PRN

## 2013-11-06 NOTE — Telephone Encounter (Signed)
Called Taylor [name] back to discuss the message from yesterday. Taylor [name] is just wanting to understand more about what to do for the pt with the nutrition part even though she is in hospice the pt is not near her end stage. Taylor [name] has been speaking with the hospice physicians and they keep telling her to call us for information. I advised Taylor [name] that really this is where the medical doctor would get involved with the pt's care b/c from a surgical standpoint there is nothing that we can offer the pt now. I advised that the nutrition would need to be managed by hospice or PCP. Taylor [name] just really doesn't know what to do about the j-tube explosion as she calls them that happens with the pt. Taylor [name] said they were wanting to talk to Dr Donne Hazel again about what is really going on under the j-tube. I explained again that Dr Donne Hazel was not going to put a new j-tube in by surgery. Taylor [name] is wanting to know if we could send a rx to [facility] for silver nitrate sticks. Taylor [name] said she knew Dr Donne Hazel did use the silver nitrate sticks around the disc of the j tube at the skin where it's so raw from the drainage. I advised Taylor [name] I would ask about the silver nitrate order and get back in touch with her. Taylor [name] understands and said I could leave a message on answering machine if not there today.

## 2013-11-09 NOTE — Telephone Encounter (Signed)
Called [name] back to notify her that Dr Donne Hazel did ok the rx for silver nitrate sticks but it's a written rx so I am mailing it to their home. I did advise [name] that Dr Donne Hazel said he is happy to see the pt and if the pt is wanting to figure out more about her GI issues then this would mean testing would have to be done. I advised [name] that I would have to send the pt to get test done at the hospital if the pt really wanted to know more but the pt is fine with no testing for now. The pt is getting a feeding pump at the end of the week to see if this would help with all the drainage that comes out of the j-tube. [name] knows to call if pt needs to be seen.

## 2014-03-23 ENCOUNTER — Other Ambulatory Visit: Payer: Self-pay | Admitting: Hematology and Oncology

## 2014-03-23 ENCOUNTER — Telehealth: Payer: Self-pay | Admitting: *Deleted

## 2014-03-23 NOTE — Telephone Encounter (Signed)
Call from pt's partner, [name], to notify Dr. Alvy Bimler and staff at [facility] that pt passed away today at home.

## 2014-04-01 DEATH — deceased

## 2014-04-02 ENCOUNTER — Ambulatory Visit: Payer: Medicare Other | Admitting: Hematology and Oncology

## 2014-04-02 ENCOUNTER — Other Ambulatory Visit: Payer: Medicare Other

## 2014-07-14 IMAGING — CR DG CHEST 2V
2 series · 2 of 2 positions shown · non-contrast
Comparison: DG CHEST 1V PORT dated 07/23/2013

CLINICAL DATA: Cough and congestion with known pleural effusions

EXAM:
CHEST  2 VIEW

[w chest lat]
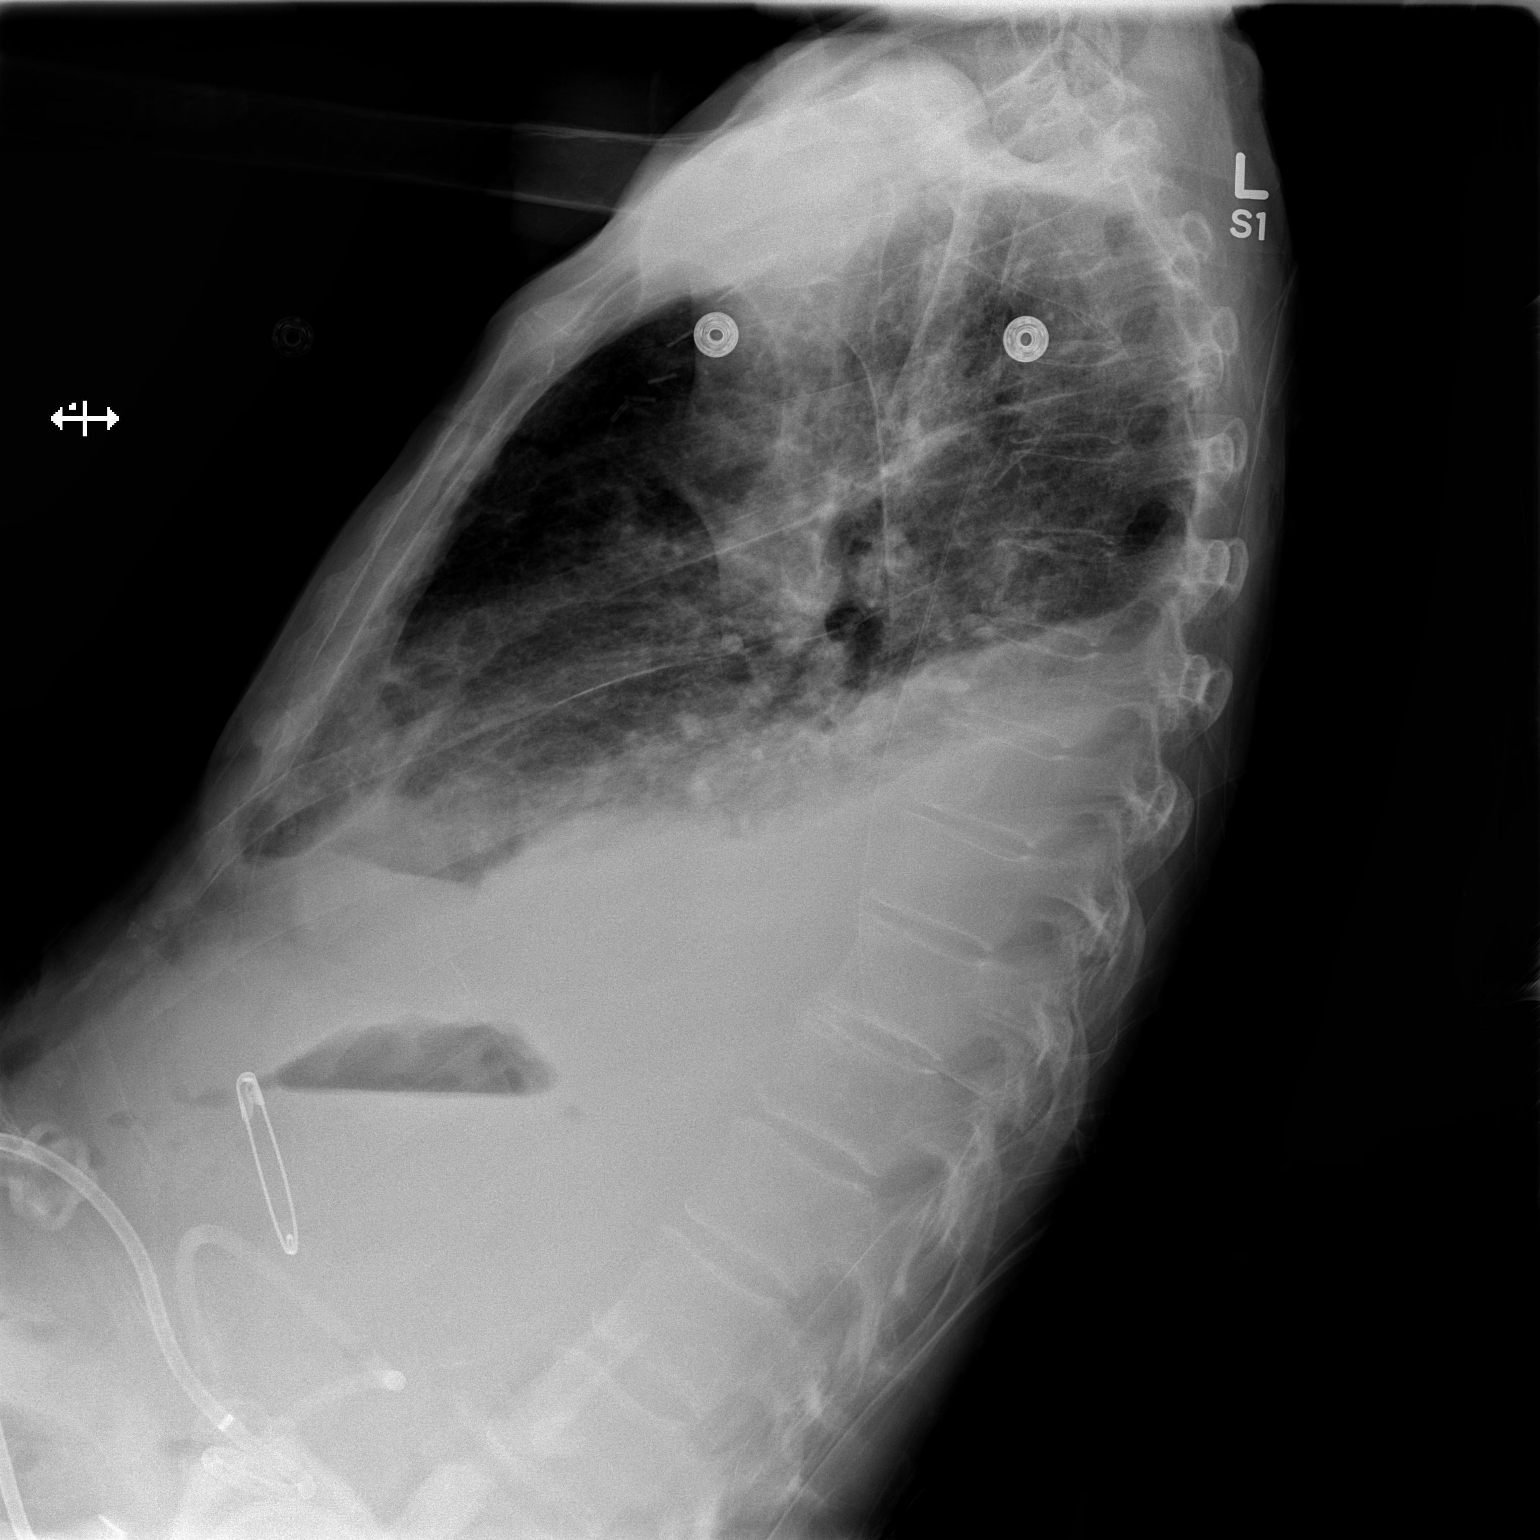

[x chest ap]
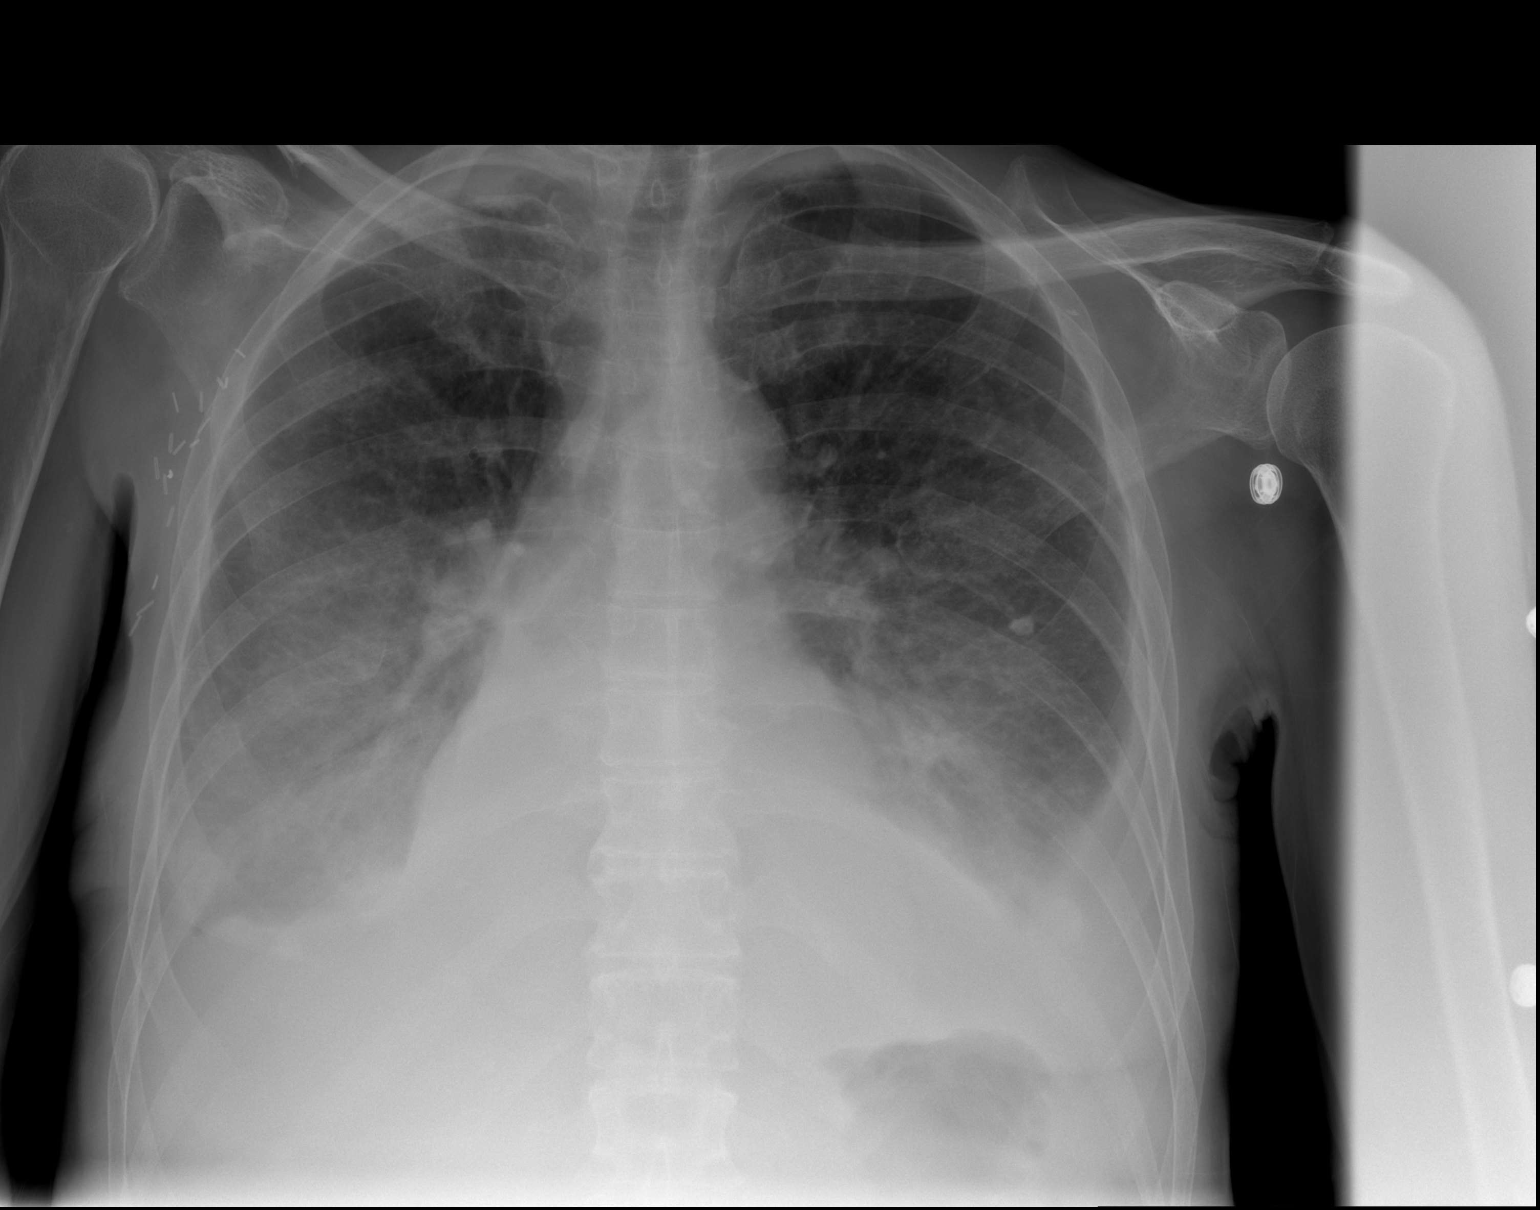

[2 of 2 positions shown; findings below may reference images not displayed]

FINDINGS: The lungs are adequately inflated. Coarse infrahilar lung markings
bilaterally are present and may reflect atelectasis or pneumonia.
Moderate-sized bilateral pleural effusions are present and may have
increased in volume on the right. There is a stable calcified nodule
in the left mid lung. The cardiac silhouette is normal in size. The
pulmonary vascularity is mildly prominent centrally but no definite
cephalization is demonstrated. The mediastinum is normal in width.
There surgical clips in the right axillary region.
IMPRESSION: 1. Moderate-sized bilateral pleural effusions are slightly more
conspicuous today especially on the right.
2. Persistent bibasilar atelectasis or pneumonia is present.
3. There is no evidence of significant CHF.

## 2014-07-14 IMAGING — CR DG CHEST 1V
1 series · 1 of 1 positions shown · non-contrast
Comparison: 07/24/2013

CLINICAL DATA: Status post left-sided thoracentesis. Possible
aspiration.

EXAM:
CHEST - 1 VIEW

[x chest ap]
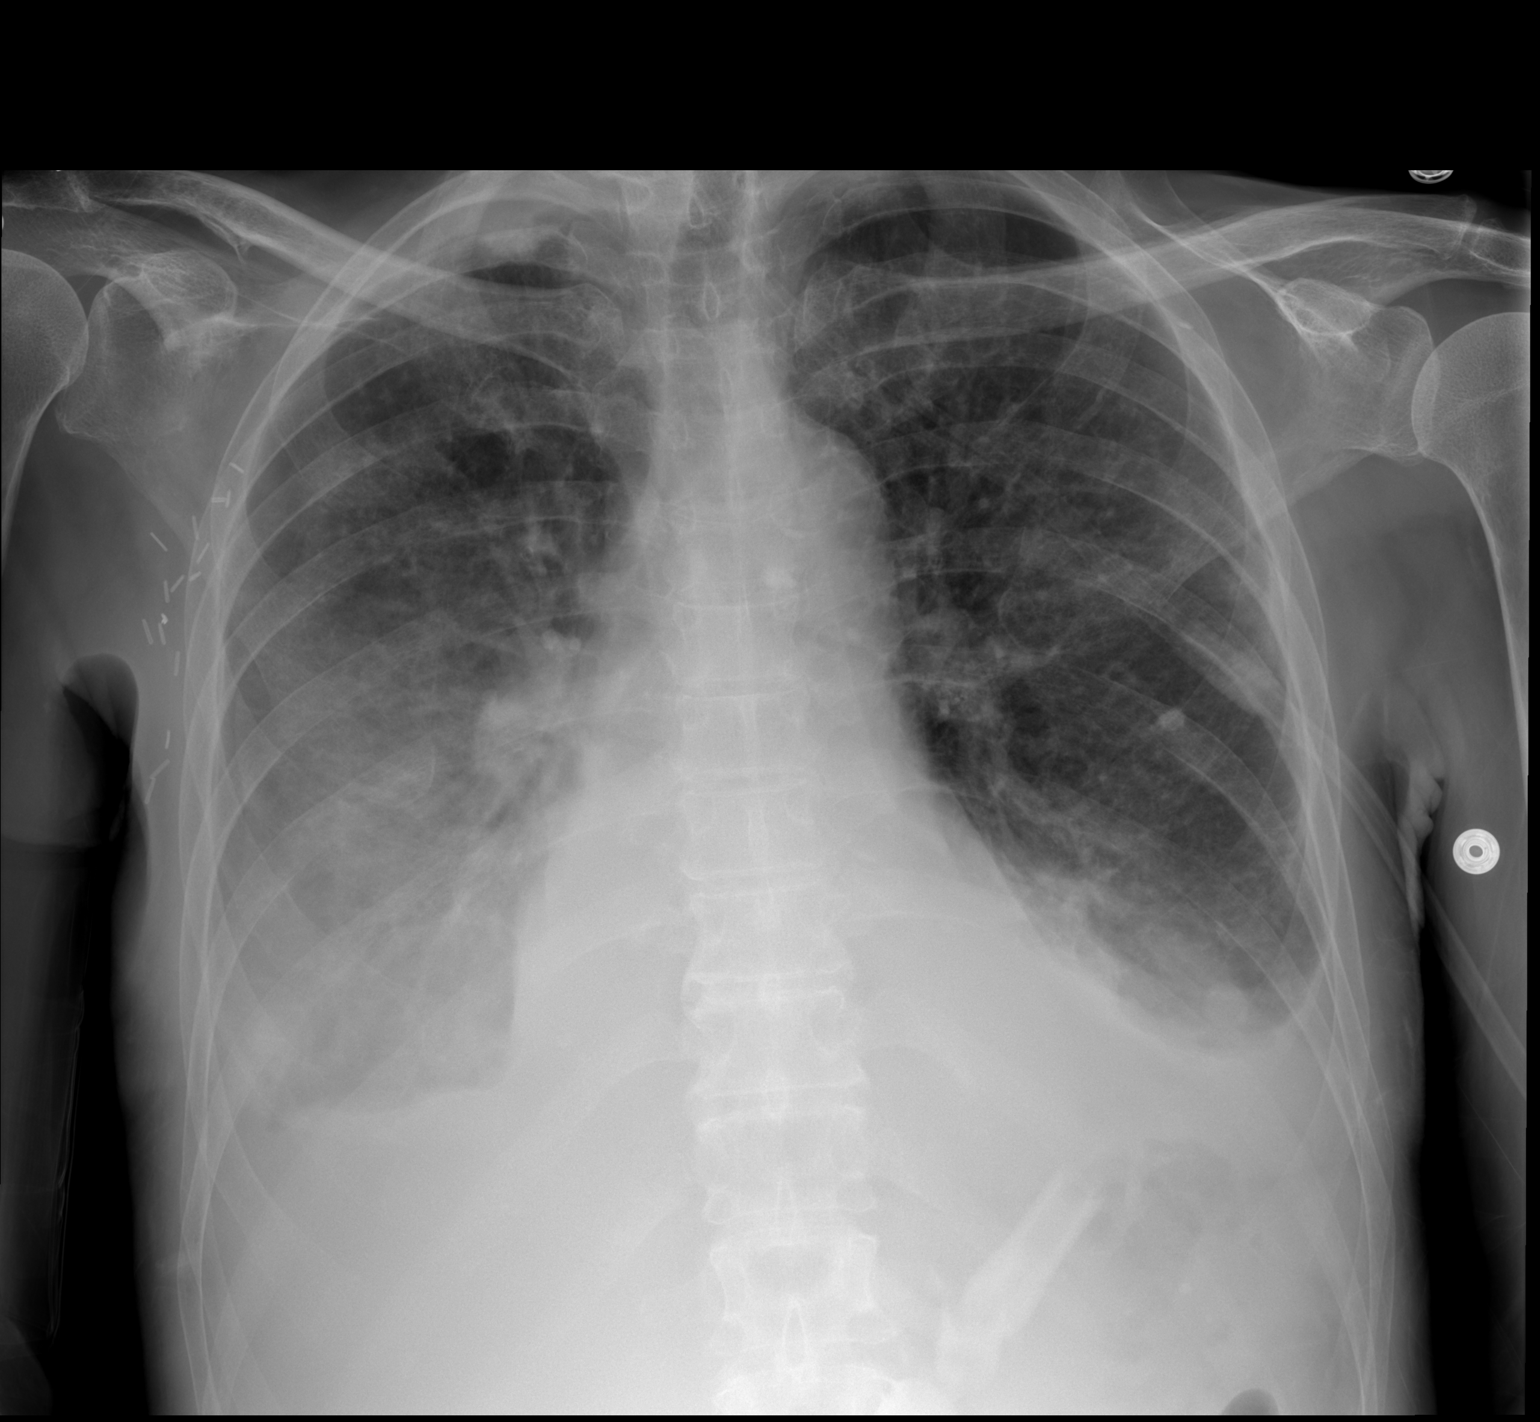

[1 of 1 positions shown; findings below may reference images not displayed]

FINDINGS: The cardiomediastinal silhouette is unchanged. Left pleural effusion
has decreased in size following thoracentesis. Moderate right
pleural effusion does not appear significantly changed. No
pneumothorax is identified. Patchy bibasilar lung opacities do not
appear significantly changed. Mild pulmonary vascular congestion is
unchanged. Small calcified nodule in the left mid lung is unchanged.
Surgical clips are noted in the right axilla. Tubing overlies the
left upper quadrant.
IMPRESSION: 1. Decreased size of left pleural effusion following thoracentesis.
No pneumothorax identified.
2. Unchanged appearance of moderate right pleural effusion.
3. Persistent bibasilar airspace opacities without significant
interval change, which may reflect atelectasis or pneumonia.

## 2014-07-15 IMAGING — CR DG CHEST 1V PORT
1 series · 1 of 1 positions shown · non-contrast
Comparison: Chest radiograph performed 07/24/2013

CLINICAL DATA: Labored breathing.

EXAM:
PORTABLE CHEST - 1 VIEW

[AP]
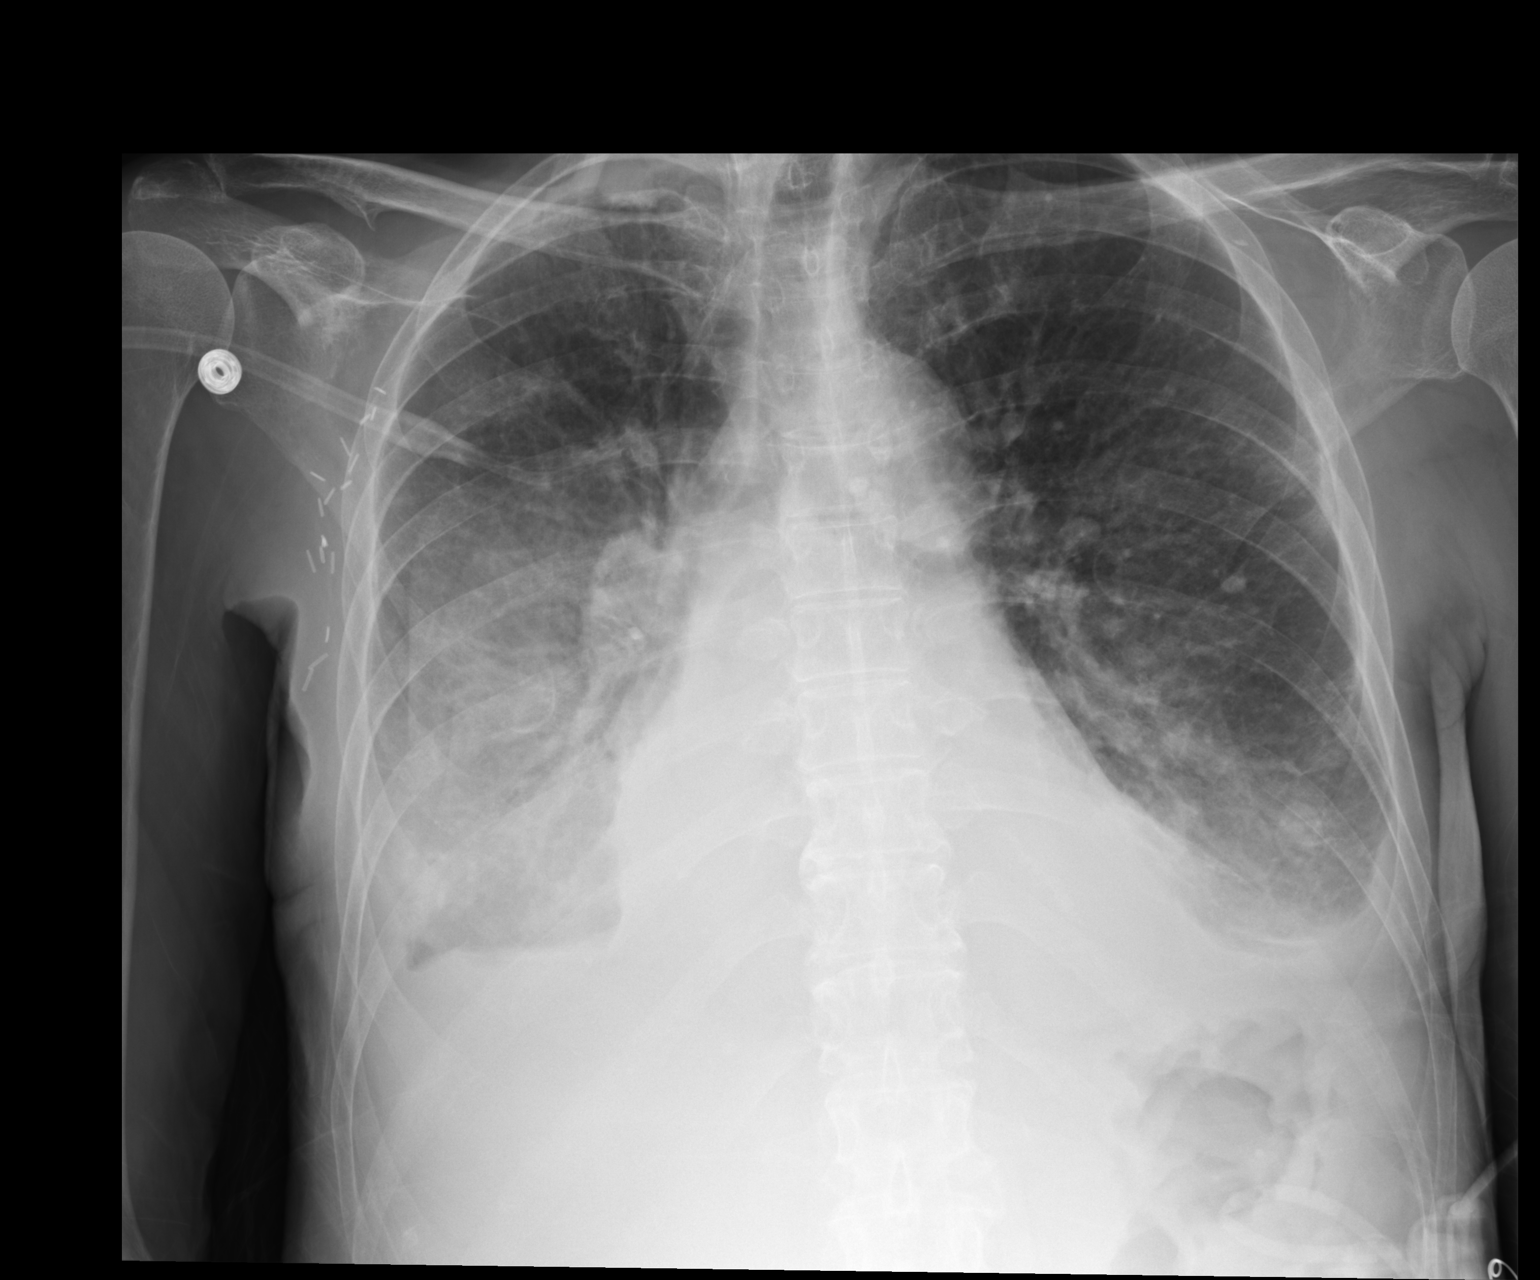

[1 of 1 positions shown; findings below may reference images not displayed]

FINDINGS: Small to moderate right and small left pleural effusions are seen,
with bibasilar airspace opacification, worse on the right. This
likely reflects pulmonary edema, though superimposed pneumonia
cannot be excluded. No pneumothorax is seen. Apparent bilateral
nipple shadows are noted.

The cardiomediastinal silhouette is borderline normal in size. No
acute osseous abnormalities are identified. Scattered clips are seen
at the right axilla.
IMPRESSION: Small to moderate right and small left pleural effusions, with
bibasilar airspace opacification, worse on the right. This likely
reflects pulmonary edema, though superimposed pneumonia cannot be
excluded. Findings are relatively stable from the prior study.

## 2014-07-15 IMAGING — CT CT CHEST W/ CM
2 of 3 series · 15 of 36 positions shown, 18 images · IV contrast (APPLIED)
Comparison: PET-CT on 09/12/2009

CLINICAL DATA: Breast carcinoma and throat carcinoma. Pleural
effusions and dyspnea.

EXAM:
CT CHEST WITH CONTRAST
TECHNIQUE: Multidetector CT imaging of the chest was performed during
intravenous contrast administration.
CONTRAST:  75mL OMNIPAQUE IOHEXOL 300 MG/ML  SOLN

[Series 2: thorax 5.0 i31f 1 · axial · 0.61mm/px · z∈[-490,-244]mm · 12 of 59 slices shown, 15 images]
[im 5/59  mediastinal]
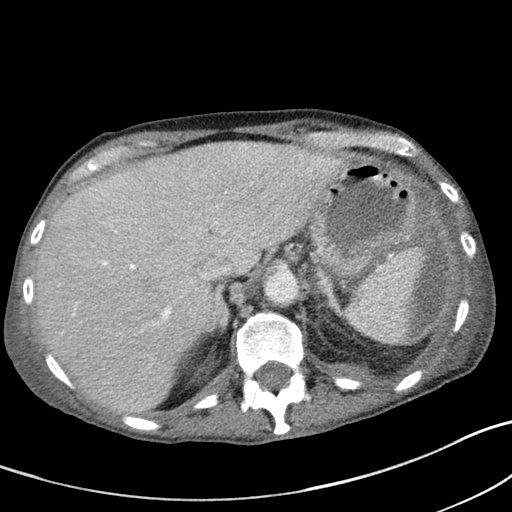
[im 5/59  lung]
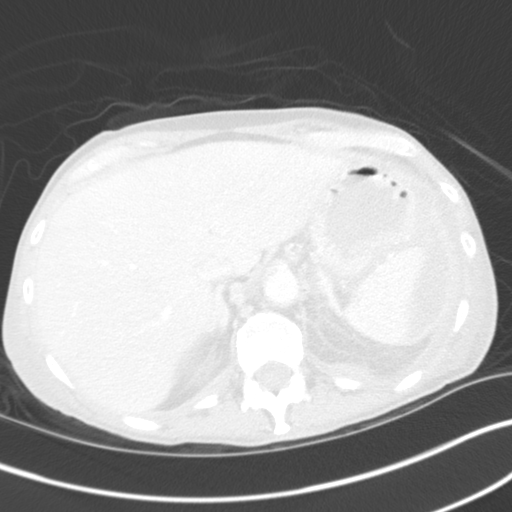
[im 9/59  lung]
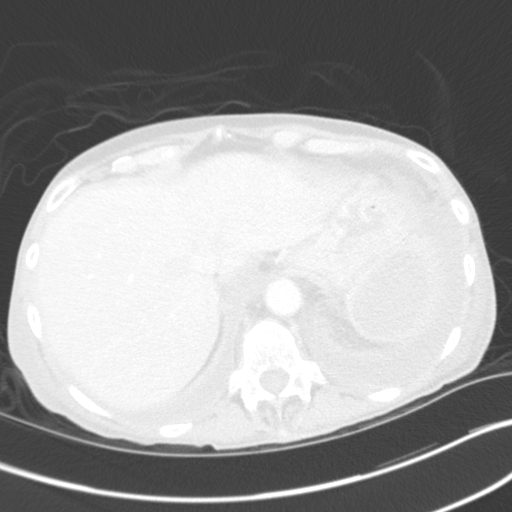
[im 13/59  lung]
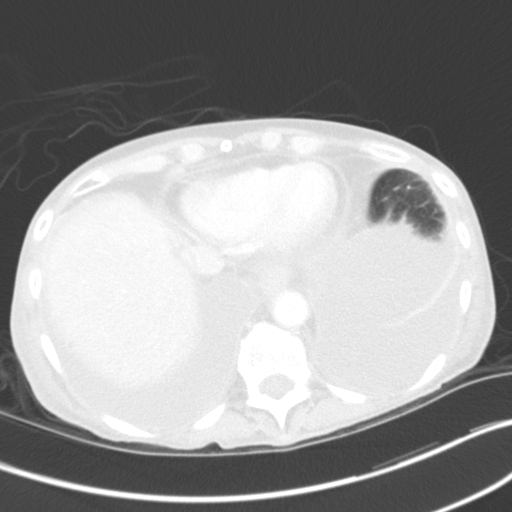
[im 18/59  lung]
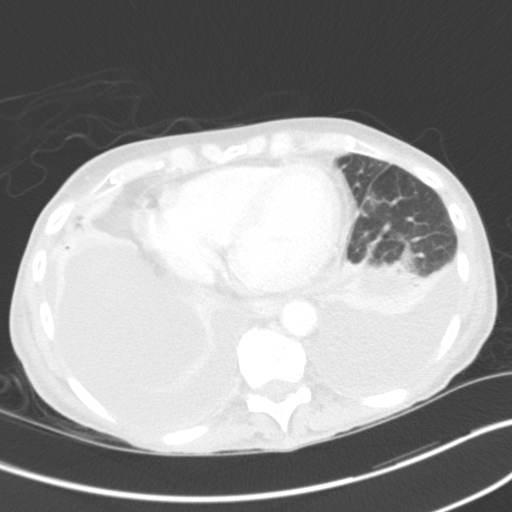
[im 22/59  mediastinal]
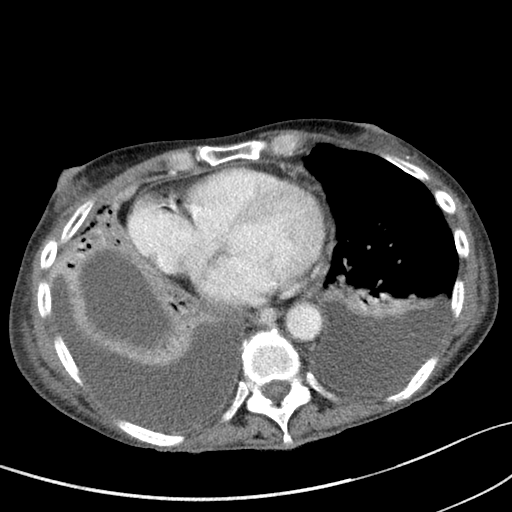
[im 22/59  lung]
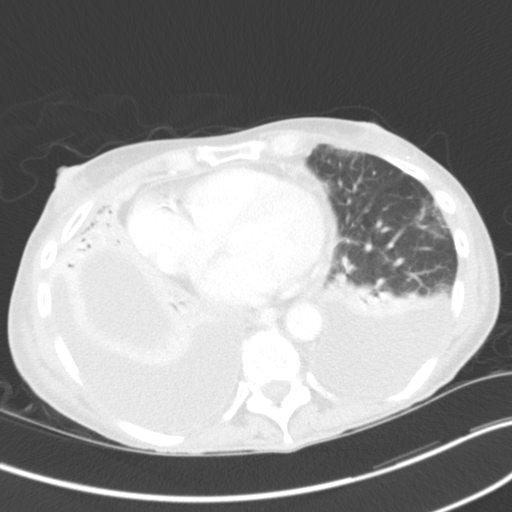
[im 26/59  lung]
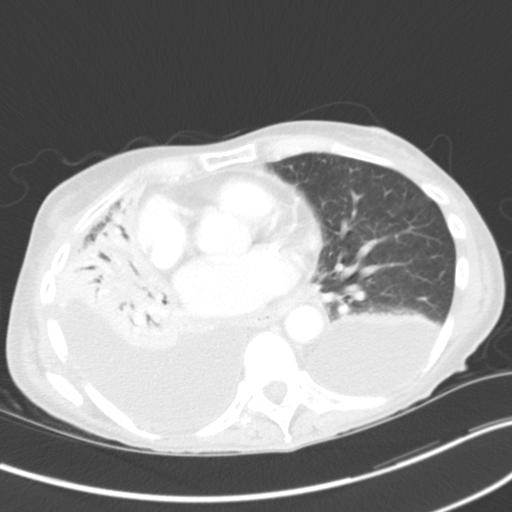
[im 33/59  lung]
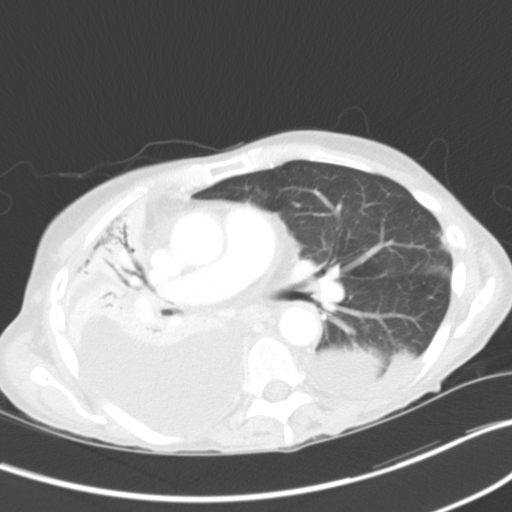
[im 37/59  lung]
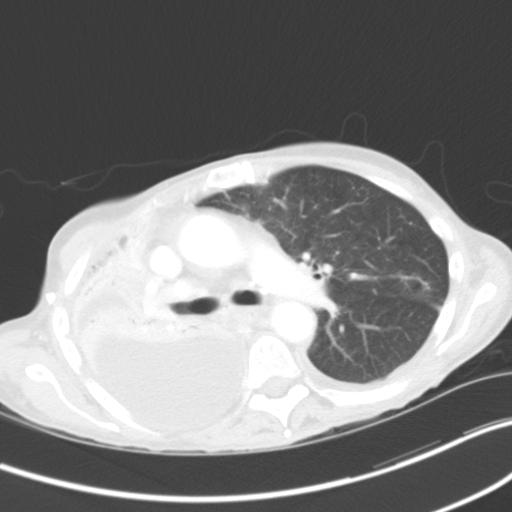
[im 41/59  mediastinal]
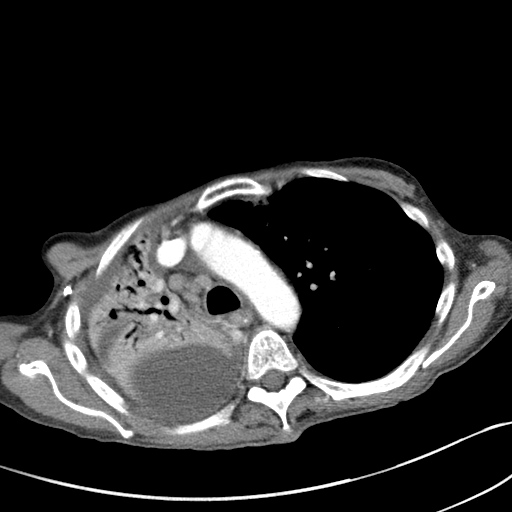
[im 41/59  lung]
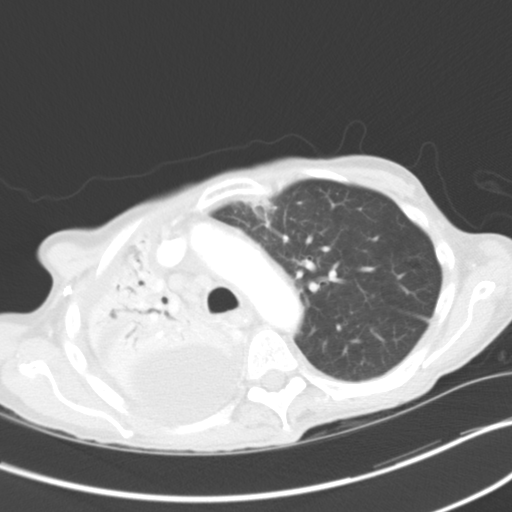
[im 46/59  lung]
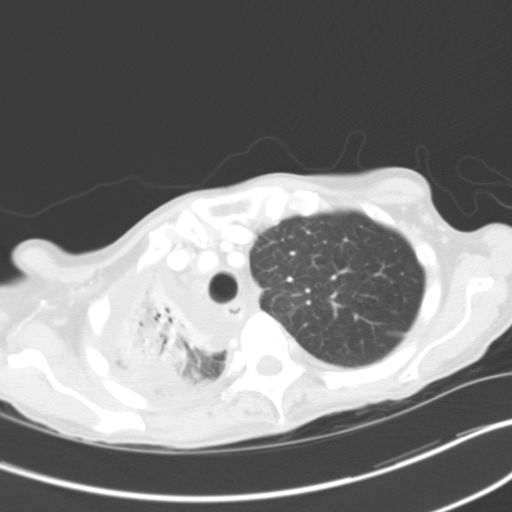
[im 50/59  lung]
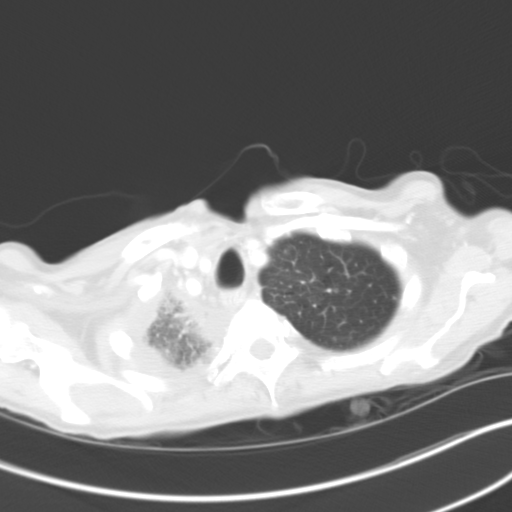
[im 54/59  lung]
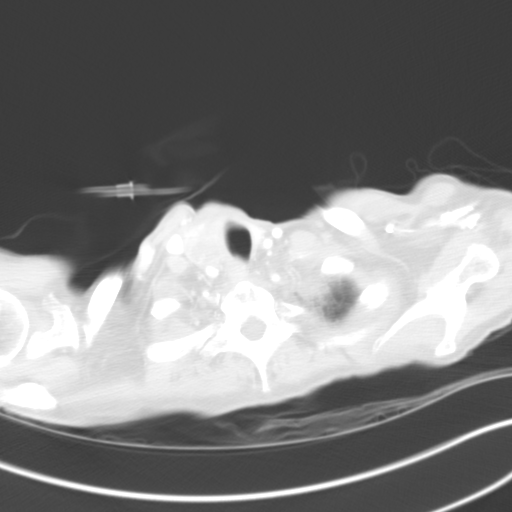

[Series 5: coronal · coronal · 0.59mm/px · 3 of 62 slices shown]
[im 13/62  lung]
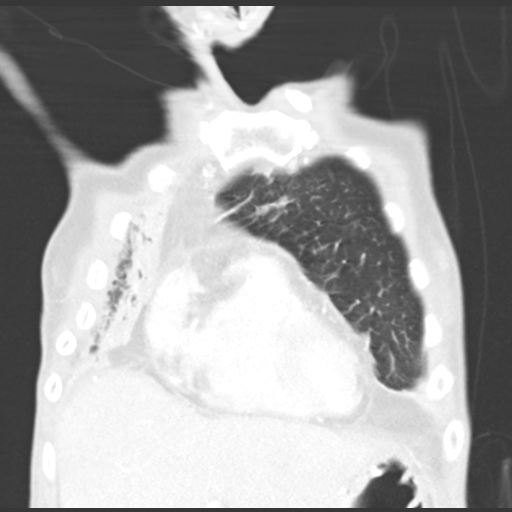
[im 25/62  lung]
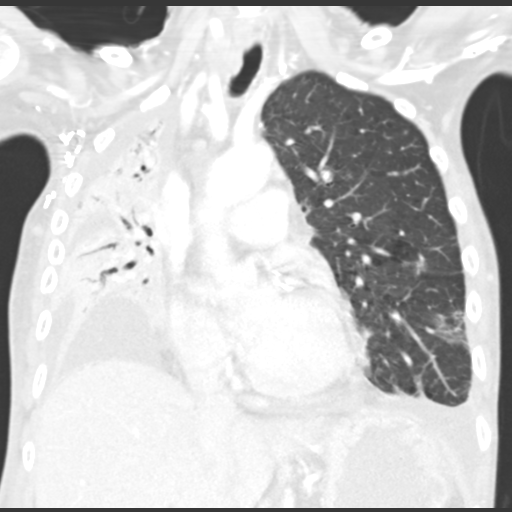
[im 37/62  lung]
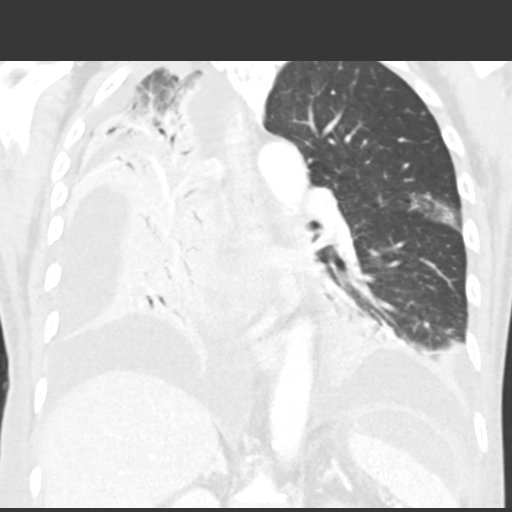

[15 of 36 positions shown; findings below may reference images not displayed]

FINDINGS: A new large right pleural effusion is seen with diffuse right lung
volume loss and consolidation. Persistent air bronchograms are seen
throughout the right lung, with no evidence of central endobronchial
obstruction. A new moderate left pleural effusion is also seen with
mild compressive atelectasis in the posterior left lower lobe. Mild
patchy areas of infiltrate or seen in the peripheral lingula and
left lower lobe, but no discrete pulmonary nodules or masses are
identified. There is no evidence of hilar lymphadenopathy. Mild
mediastinal lymphadenopathy is noted in the right paratracheal
region with largest lymph node measuring 11 mm on image 21. No other
sites of mediastinal lymphadenopathy identified.

Surgical clips noted in the right axilla. No evidence chest wall
mass or suspicious bone lesions. Both adrenal glands are normal in
appearance.
IMPRESSION: New large right and small left pleural effusions with compressive
atelectasis. Diffuse right lung atelectasis or consolidation noted,
without evidence of centrally obstructing mass.

Mild patchy areas of infiltrate in peripheral left upper and lower
lobes. Pneumonia or other inflammatory process cannot be excluded.

Mild right paratracheal mediastinal lymphadenopathy, which is
nonspecific. This may be reactive in etiology although metastatic
disease cannot be excluded.

## 2014-12-27 ENCOUNTER — Other Ambulatory Visit: Payer: Self-pay
